# Patient Record
Sex: Male | Born: 1945 | Race: Black or African American | Hispanic: No | Marital: Married | Smoking: Never smoker
Health system: Southern US, Community
[De-identification: ages and names within clinical notes are randomized; demographics above are authoritative.]

## PROBLEM LIST (undated history)

## (undated) DIAGNOSIS — K219 Gastro-esophageal reflux disease without esophagitis: Secondary | ICD-10-CM

## (undated) DIAGNOSIS — E7800 Pure hypercholesterolemia, unspecified: Secondary | ICD-10-CM

## (undated) DIAGNOSIS — R338 Other retention of urine: Secondary | ICD-10-CM

## (undated) DIAGNOSIS — I10 Essential (primary) hypertension: Secondary | ICD-10-CM

## (undated) DIAGNOSIS — N401 Enlarged prostate with lower urinary tract symptoms: Secondary | ICD-10-CM

## (undated) DIAGNOSIS — E119 Type 2 diabetes mellitus without complications: Secondary | ICD-10-CM

## (undated) DIAGNOSIS — M549 Dorsalgia, unspecified: Secondary | ICD-10-CM

## (undated) HISTORY — PX: COLONOSCOPY: SHX174

## (undated) HISTORY — PX: APPENDECTOMY: SHX54

---

## 1997-06-09 ENCOUNTER — Other Ambulatory Visit: Admission: RE | Admit: 1997-06-09 | Discharge: 1997-06-09 | Payer: Self-pay | Admitting: Internal Medicine

## 2000-04-14 ENCOUNTER — Encounter: Admission: RE | Admit: 2000-04-14 | Discharge: 2000-07-13 | Payer: Self-pay | Admitting: Internal Medicine

## 2004-03-08 ENCOUNTER — Ambulatory Visit (HOSPITAL_COMMUNITY): Admission: RE | Admit: 2004-03-08 | Discharge: 2004-03-08 | Payer: Self-pay | Admitting: Gastroenterology

## 2012-11-23 ENCOUNTER — Emergency Department (HOSPITAL_COMMUNITY): Payer: No Typology Code available for payment source

## 2012-11-23 ENCOUNTER — Encounter (HOSPITAL_COMMUNITY): Payer: Self-pay | Admitting: Emergency Medicine

## 2012-11-23 ENCOUNTER — Emergency Department (HOSPITAL_COMMUNITY)
Admission: EM | Admit: 2012-11-23 | Discharge: 2012-11-23 | Disposition: A | Discharge status: Home / Self Care | Payer: No Typology Code available for payment source | Attending: Emergency Medicine | Admitting: Emergency Medicine

## 2012-11-23 DIAGNOSIS — E119 Type 2 diabetes mellitus without complications: Secondary | ICD-10-CM | POA: Insufficient documentation

## 2012-11-23 DIAGNOSIS — Z79899 Other long term (current) drug therapy: Secondary | ICD-10-CM | POA: Insufficient documentation

## 2012-11-23 DIAGNOSIS — I10 Essential (primary) hypertension: Secondary | ICD-10-CM | POA: Insufficient documentation

## 2012-11-23 DIAGNOSIS — IMO0002 Reserved for concepts with insufficient information to code with codable children: Secondary | ICD-10-CM | POA: Insufficient documentation

## 2012-11-23 DIAGNOSIS — Z7982 Long term (current) use of aspirin: Secondary | ICD-10-CM | POA: Insufficient documentation

## 2012-11-23 DIAGNOSIS — M25519 Pain in unspecified shoulder: Secondary | ICD-10-CM | POA: Insufficient documentation

## 2012-11-23 DIAGNOSIS — M25522 Pain in left elbow: Secondary | ICD-10-CM

## 2012-11-23 DIAGNOSIS — M25529 Pain in unspecified elbow: Secondary | ICD-10-CM | POA: Insufficient documentation

## 2012-11-23 DIAGNOSIS — Z880 Allergy status to penicillin: Secondary | ICD-10-CM | POA: Insufficient documentation

## 2012-11-23 DIAGNOSIS — M542 Cervicalgia: Secondary | ICD-10-CM | POA: Insufficient documentation

## 2012-11-23 DIAGNOSIS — R079 Chest pain, unspecified: Secondary | ICD-10-CM | POA: Insufficient documentation

## 2012-11-23 DIAGNOSIS — R51 Headache: Secondary | ICD-10-CM | POA: Insufficient documentation

## 2012-11-23 DIAGNOSIS — M25512 Pain in left shoulder: Secondary | ICD-10-CM

## 2012-11-23 DIAGNOSIS — Y939 Activity, unspecified: Secondary | ICD-10-CM | POA: Insufficient documentation

## 2012-11-23 DIAGNOSIS — E7800 Pure hypercholesterolemia, unspecified: Secondary | ICD-10-CM | POA: Insufficient documentation

## 2012-11-23 DIAGNOSIS — Y92410 Unspecified street and highway as the place of occurrence of the external cause: Secondary | ICD-10-CM | POA: Insufficient documentation

## 2012-11-23 HISTORY — DX: Essential (primary) hypertension: I10

## 2012-11-23 HISTORY — DX: Type 2 diabetes mellitus without complications: E11.9

## 2012-11-23 HISTORY — DX: Pure hypercholesterolemia, unspecified: E78.00

## 2012-11-23 MED ORDER — METHOCARBAMOL 500 MG PO TABS: 500.0000 mg | ORAL_TABLET | Freq: Two times a day (BID) | ORAL | Status: DC
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
## 2012-11-23 NOTE — ED Notes (Signed)
Patient transported back from X-ray 

## 2012-11-26 NOTE — ED Provider Notes (Signed)
Medical screening examination/treatment/procedure(s) were performed by non-physician practitioner and as supervising physician I was immediately available for consultation/collaboration.  EKG Interpretation   None        Juliet Rude. Rubin Payor, MD 11/26/12 1610

## 2014-07-01 ENCOUNTER — Other Ambulatory Visit (HOSPITAL_COMMUNITY): Payer: Self-pay | Admitting: Urology

## 2014-07-01 DIAGNOSIS — R972 Elevated prostate specific antigen [PSA]: Secondary | ICD-10-CM

## 2014-07-13 ENCOUNTER — Ambulatory Visit (INDEPENDENT_AMBULATORY_CARE_PROVIDER_SITE_OTHER): Payer: PPO | Admitting: Psychology

## 2014-07-13 DIAGNOSIS — F332 Major depressive disorder, recurrent severe without psychotic features: Secondary | ICD-10-CM

## 2014-07-21 ENCOUNTER — Ambulatory Visit (HOSPITAL_COMMUNITY)
Admission: RE | Admit: 2014-07-21 | Discharge: 2014-07-21 | Disposition: A | Discharge status: Home / Self Care | Payer: PPO | Source: Ambulatory Visit | Attending: Urology | Admitting: Urology

## 2014-07-21 DIAGNOSIS — N433 Hydrocele, unspecified: Secondary | ICD-10-CM | POA: Diagnosis not present

## 2014-07-21 DIAGNOSIS — N400 Enlarged prostate without lower urinary tract symptoms: Secondary | ICD-10-CM | POA: Insufficient documentation

## 2014-07-21 DIAGNOSIS — R972 Elevated prostate specific antigen [PSA]: Secondary | ICD-10-CM | POA: Diagnosis present

## 2014-07-21 DIAGNOSIS — N320 Bladder-neck obstruction: Secondary | ICD-10-CM | POA: Insufficient documentation

## 2014-07-21 LAB — POCT I-STAT CREATININE: CREATININE: 1.3 mg/dL — AB (ref 0.61–1.24)

## 2014-07-21 MED ORDER — GADOBENATE DIMEGLUMINE 529 MG/ML IV SOLN
20.0000 mL | Freq: Once | INTRAVENOUS | Status: AC | PRN
  Administered 2014-07-21: 16 mL via INTRAVENOUS

## 2014-07-26 DIAGNOSIS — M545 Low back pain, unspecified: Secondary | ICD-10-CM | POA: Insufficient documentation

## 2014-08-10 ENCOUNTER — Ambulatory Visit (INDEPENDENT_AMBULATORY_CARE_PROVIDER_SITE_OTHER): Payer: PPO | Admitting: Psychology

## 2014-08-10 DIAGNOSIS — F332 Major depressive disorder, recurrent severe without psychotic features: Secondary | ICD-10-CM

## 2014-09-03 ENCOUNTER — Ambulatory Visit (INDEPENDENT_AMBULATORY_CARE_PROVIDER_SITE_OTHER): Payer: PPO | Admitting: Physician Assistant

## 2014-09-03 VITALS — BP 132/80 | HR 92 | Temp 98.2°F | Resp 16 | Ht 69.0 in | Wt 172.0 lb

## 2014-09-03 DIAGNOSIS — T839XXA Unspecified complication of genitourinary prosthetic device, implant and graft, initial encounter: Secondary | ICD-10-CM | POA: Diagnosis not present

## 2014-09-03 NOTE — Progress Notes (Signed)
   09/03/2014 at 2:36 PM  Ricky Shields / DOB: 27-Jan-1945 / MRN: [id number]  The patient  does not have a problem list on file.  SUBJECTIVE  Ricky Shields is a 69 y.o. well appearing male presenting for the chief complaint of leaky urine bag that started this morning.  He reports the bag is leaking urine at the bottom of the bag, and reports that his urethral catheter is not problematic at this time and he feels well overall.       He  has a past medical history of Hypertension; Diabetes mellitus without complication; and High cholesterol.    Medications reviewed and updated by myself where necessary, and exist elsewhere in the encounter.   Mr. Rueda is allergic to penicillins. He  reports that he has never smoked. He does not have any smokeless tobacco history on file. He reports that he does not drink alcohol or use illicit drugs. He  has no sexual activity history on file. The patient  has no past surgical history on file.  His family history includes Stroke in his father and mother.  Review of Systems  Constitutional: Negative.   Genitourinary: Negative.     OBJECTIVE  His  height is 5\' 9"  (1.753 m) and weight is 172 lb (78.019 kg). His oral temperature is 98.2 F (36.8 C). His blood pressure is 132/80 and his pulse is 92. His respiration is 16 and oxygen saturation is 99%.  The patient's body mass index is 25.39 kg/(m^2).  Physical Exam  Constitutional: He is oriented to person, place, and time. He appears well-developed and well-nourished. No distress.  Cardiovascular: Normal rate.   Respiratory: Effort normal.  Neurological: He is alert and oriented to person, place, and time.  Skin: Skin is warm and dry. He is not diaphoretic.  Psychiatric: He has a normal mood and affect.    No results found for this or any previous visit (from the past 24 hour(s)).  ASSESSMENT & PLAN  [name] was seen today for catheter bag leak.  Diagnoses and all orders for this visit:  Foley  catheter problem, initial encounter: Patient charged only for the bag.     The patient was advised to call or come back to clinic if he does not see an improvement in symptoms, or worsens with the above plan.   [name], MHS, PA-C [facility] 09/03/2014 2:36 PM

## 2014-09-14 ENCOUNTER — Ambulatory Visit (INDEPENDENT_AMBULATORY_CARE_PROVIDER_SITE_OTHER): Payer: PPO | Admitting: Psychology

## 2014-09-14 DIAGNOSIS — F332 Major depressive disorder, recurrent severe without psychotic features: Secondary | ICD-10-CM

## 2014-09-23 ENCOUNTER — Other Ambulatory Visit: Payer: Self-pay | Admitting: Urology

## 2014-10-14 ENCOUNTER — Encounter (HOSPITAL_BASED_OUTPATIENT_CLINIC_OR_DEPARTMENT_OTHER): Payer: Self-pay | Admitting: *Deleted

## 2014-10-14 NOTE — Progress Notes (Signed)
To [facility] at 0645  Istat,Ekg on arrival-Instructed Npo after Mn-verbalized understands.

## 2014-10-20 NOTE — H&P (Signed)
History of Present Illness Consult for BPH and urinary retention referred by Dr. Janice Norrie. PCP Dr. Lavina Hamman.     1 - urinary retention - pt developed urinary retention. PVR > 1000 ml. He voided a small amount of urine and repeat PVR is 979 ml. His serum creatinine was 1.3. He was catheterized for 990 ml. He failed a voiding trial. UDS revealed good bladder contraction with no flow. Bladder somewhat unstable.     2 - BPH - pt is on alfuzosin, Renal u/s shows no hydronephrosis. Prostate volume is 86.74 ml. I reviewed the images.     3 - history of elevated PSA with a negative prostate biopsy in September 2009. PSA 5.32. PSA has been fluctuating between 5.1 and 6.9. PHI in December 2015 shows 83% probability of being cancer free. PHI score is 28.67. His June 2016 PSA was 8.07 with a 26% free. F/u Jul 2016 MRI showed no signs of macroscopic prostate cancer, but a distended bladder.      Today, patient is seen for the above. Foley in place, draining well. Urine clear. No fever     Past Medical History Problems  1. History of hypercholesterolemia (Z86.39) 2. History of hypertension (Z86.79)  Surgical History Problems  1. History of Colostomy 2. History of Complete Colonoscopy  Current Meds 1. Alfuzosin HCl ER 10 MG Oral Tablet Extended Release 24 Hour; take 1 tablet by mouth at  bedtime;  Therapy: 10Sep2015 to (Evaluate:21Feb2017)  Requested for: 25Aug2016; Last  Rx:25Aug2016 Ordered 2. Amlodipine-Valsartan-HCTZ 5-160-12.5 MG Oral Tablet;  Therapy: [id number] to Recorded 3. Aspirin 81 MG TABS;  Therapy: (Recorded:29Feb2012) to Recorded 4. Ciprofloxacin HCl - 500 MG Oral Tablet; TAKE 1 TABLET BID STARTING THE DAY  BEFORE PROCEDURE;  Therapy: [id number] to (Last Rx:11Jul2016)  Requested for: [id number] Ordered 5. Diazepam 10 MG Oral Tablet; Take tablet 1 hour prior to procedure;  Therapy: 21Jun2016 to (Last Rx:21Jun2016) Ordered 6. MetFORMIN HCl TABS;  Therapy: (Recorded:12Feb2009) to  Recorded 7. Multi Vitamin/Minerals TABS;  Therapy: (Recorded:29Feb2012) to Recorded 8. Simvastatin 40 MG Oral Tablet;  Therapy: 05Sep2007 to Recorded 9. ValACYclovir HCl - 500 MG Oral Tablet; take 1 tablet by mouth once daily;  Therapy: 20Sep2012 to (Evaluate:21Feb2017)  Requested for: 25Aug2016; Last  Rx:25Aug2016 Ordered 10. Valsartan-Hydrochlorothiazide 160-12.5 MG Oral Tablet;   Therapy: [id number] to Recorded 11. Viagra 100 MG Oral Tablet; UAD - USE AS DIRECTED;   Therapy: 10Aug2009 to (Evaluate:09May2016)  Requested for: [id number]; Last   Rx:17Dec2015 Ordered  Allergies Medication  1. Penicillins  Family History Problems  1. Family history of Family Health Status Number Of Children   2 sons 1 daughter 2. Family history of hypertension (Z82.49) : Sister  Social History Problems  1. Alcohol Use 2. Former smoker [phone]) 3. Marital History - Single 4. History of Tobacco Use   smoked for 5 years, quit 40 years ago  Vitals Vital Signs [Data Includes: Last 1 Day]  Recorded: 09Sep2016 08:12AM  Weight: 172 lb  BMI Calculated: 25.4 BSA Calculated: 1.94 Blood Pressure: 124 / 74 Temperature: 97.5 F Heart Rate: 83  Results/Data Urine [Data Includes: Last 1 Day]   [id number]  COLOR YELLOW   APPEARANCE CLOUDY   SPECIFIC GRAVITY 1.025   pH 6.0   GLUCOSE NEGATIVE   BILIRUBIN NEGATIVE   KETONE NEGATIVE   BLOOD 3+   PROTEIN 3+   NITRITE POSITIVE   LEUKOCYTE ESTERASE 2+   SQUAMOUS EPITHELIAL/HPF 0-5 HPF  WBC >60 WBC/HPF  RBC 3-10 RBC/HPF  BACTERIA MANY HPF  CRYSTALS NONE SEEN HPF  CASTS NONE SEEN LPF  Yeast NONE SEEN HPF   The following images/tracing/specimen were independently visualized:  UDS, MRI, U/S.    Procedure  Procedure: Cystoscopy   Informed Consent: Risks, benefits, and potential adverse events were discussed and informed consent was obtained from the patient.  Prep: The patient was prepped with betadine.  Antibiotic prophylaxis: Ciprofloxacin.   Procedure Note:  Urethral meatus:. No abnormalities.  Anterior urethra: No abnormalities.  Prostatic urethra: No abnormalities . The lateral and median prostatic lobes were enlarged. An enlarged intravesical median lobe was visualized.  Bladder: Visulization was clear. The ureteral orifices were in the normal anatomic position bilaterally and had clear efflux of urine. A systematic survey of the bladder demonstrated no bladder tumors or stones. The mucosa was smooth without abnormalities. Examination of the bladder demonstrated trabeculation. The patient tolerated the procedure well.  Complications: None. He was filled to 250 cc. He had the urge to void but could not. He was prepped and a 16 [place] Foley catheter was placed without difficulty. It was left to gravity drainage.    Assessment Assessed  1. Benign prostatic hyperplasia with urinary obstruction (N40.1,N13.8) 2. Urinary retention (R33.9) 3. Elevated prostate specific antigen (PSA) (R97.2)  Plan Benign prostatic hyperplasia with urinary obstruction  1. Cysto; Status:Complete;   Done: [id number] Health Maintenance  2. UA With REFLEX; [Do Not Release]; Status:Complete;   Done: [id number] 08:01AM Urinary retention  3. Follow-up [place] Procedure Office  Follow-up  Status: Canceled - Appointment,Date of  Service 4. Follow-up Schedule Surgery Office  Follow-up  Status: Hold For - Appointment   Requested for: 09Sep2016 5. Cath, simple, wIinsert Temp Cath; Status:Hold For - Appointment,Date of Service;  Requested GNO:[id number];  6. URINE CULTURE; Status:Hold For - Specimen/Data Collection,Appointment; Requested  VQX:[id number];   Discussion/Summary BPH, urinary retention - we discussed the nature risk and benefits of adding a 5 alpha reductase inhibitor. We discussed the nature risk and benefits of procedures such as ureteral left, greenlight photo vaporization or TURP. All questions answered. Patient would like to proceed with greenlight  photo vaporization of the prostate. He has a prominent median lobe. Discussed with procedures in most cases retention and flow improves, frequency and urgency can improvement in some cases developer worsen. We also discussed risk of incontinence, stricture, bleeding, erectile dysfunction among others. We discussed with the larger prostate he may need a staged procedure.        Elevated PSA - We discussed the nature risks and benefits of PSA screening as well as the nature of elevated PSA (benign versus malignant). We discussed the possibility of prostate cancer exists as the PSA rises above 2.5 and even as it rises over 1. We discussed the nature risks and benefits of continued surveillance, other lab tests, transrectal ultrasound/prostate biopsy, or prostate MRI. We discussed the management of prostate cancer might include active surveillance versus treatment depending on patient and cancer characteristics. All questions answered. Given his prior negative biopsy, normal MRI, normal PSA density, high percent free his chance of having a high-grade prostate cancer is relatively low. We discussed the possibility of prostate cancer. We've discussed we will continue to monitor PSA and digital rectal exams     Signatures Electronically signed by : Festus Aloe, M.D.; Sep 23 2014  1:25PM EST  Add; Cx citrobacter - sent abx.

## 2014-10-21 ENCOUNTER — Ambulatory Visit (HOSPITAL_BASED_OUTPATIENT_CLINIC_OR_DEPARTMENT_OTHER)
Admission: RE | Admit: 2014-10-21 | Discharge: 2014-10-21 | Disposition: A | Discharge status: Home / Self Care | Payer: PPO | Source: Ambulatory Visit | Attending: Urology | Admitting: Urology

## 2014-10-21 ENCOUNTER — Ambulatory Visit (HOSPITAL_BASED_OUTPATIENT_CLINIC_OR_DEPARTMENT_OTHER): Payer: PPO | Admitting: Anesthesiology

## 2014-10-21 ENCOUNTER — Encounter (HOSPITAL_BASED_OUTPATIENT_CLINIC_OR_DEPARTMENT_OTHER)
Admission: RE | Disposition: A | Discharge status: Home / Self Care | Payer: Self-pay | Source: Ambulatory Visit | Attending: Urology

## 2014-10-21 ENCOUNTER — Encounter (HOSPITAL_BASED_OUTPATIENT_CLINIC_OR_DEPARTMENT_OTHER): Payer: Self-pay | Admitting: Anesthesiology

## 2014-10-21 ENCOUNTER — Other Ambulatory Visit: Payer: Self-pay

## 2014-10-21 DIAGNOSIS — Z87891 Personal history of nicotine dependence: Secondary | ICD-10-CM | POA: Diagnosis not present

## 2014-10-21 DIAGNOSIS — I10 Essential (primary) hypertension: Secondary | ICD-10-CM | POA: Diagnosis not present

## 2014-10-21 DIAGNOSIS — Z7984 Long term (current) use of oral hypoglycemic drugs: Secondary | ICD-10-CM | POA: Diagnosis not present

## 2014-10-21 DIAGNOSIS — E119 Type 2 diabetes mellitus without complications: Secondary | ICD-10-CM | POA: Insufficient documentation

## 2014-10-21 DIAGNOSIS — N401 Enlarged prostate with lower urinary tract symptoms: Secondary | ICD-10-CM | POA: Insufficient documentation

## 2014-10-21 DIAGNOSIS — R338 Other retention of urine: Secondary | ICD-10-CM | POA: Diagnosis not present

## 2014-10-21 HISTORY — DX: Benign prostatic hyperplasia with lower urinary tract symptoms: R33.8

## 2014-10-21 HISTORY — DX: Gastro-esophageal reflux disease without esophagitis: K21.9

## 2014-10-21 HISTORY — PX: GREEN LIGHT LASER TURP (TRANSURETHRAL RESECTION OF PROSTATE: SHX6260

## 2014-10-21 HISTORY — DX: Dorsalgia, unspecified: M54.9

## 2014-10-21 HISTORY — DX: Benign prostatic hyperplasia with lower urinary tract symptoms: N40.1

## 2014-10-21 LAB — POCT I-STAT 4, (NA,K, GLUC, HGB,HCT)
Glucose, Bld: 177 mg/dL — ABNORMAL HIGH (ref 65–99)
HCT: 41 % (ref 39.0–52.0)
HEMOGLOBIN: 13.9 g/dL (ref 13.0–17.0)
POTASSIUM: 4.1 mmol/L (ref 3.5–5.1)
SODIUM: 140 mmol/L (ref 135–145)

## 2014-10-21 LAB — GLUCOSE, CAPILLARY: Glucose-Capillary: 145 mg/dL — ABNORMAL HIGH (ref 65–99)

## 2014-10-21 SURGERY — GREEN LIGHT LASER TURP (TRANSURETHRAL RESECTION OF PROSTATE
Anesthesia: General | Site: Prostate

## 2014-10-21 MED ORDER — LACTATED RINGERS IV SOLN
INTRAVENOUS | Status: DC
Start: 2014-10-21 — End: 2014-10-21
  Filled 2014-10-21: qty 1000

## 2014-10-21 MED ORDER — DEXAMETHASONE SODIUM PHOSPHATE 4 MG/ML IJ SOLN
INTRAMUSCULAR | Status: DC | PRN
  Administered 2014-10-21: 10 mg via INTRAVENOUS

## 2014-10-21 MED ORDER — PROPOFOL 10 MG/ML IV BOLUS
INTRAVENOUS | Status: DC | PRN
  Administered 2014-10-21: 20 mg via INTRAVENOUS
  Administered 2014-10-21: 180 mg via INTRAVENOUS

## 2014-10-21 MED ORDER — LACTATED RINGERS IV SOLN
INTRAVENOUS | Status: DC
  Administered 2014-10-21 (×2): via INTRAVENOUS
  Filled 2014-10-21: qty 1000

## 2014-10-21 MED ORDER — SODIUM CHLORIDE 0.9 % IR SOLN
Status: DC | PRN
  Administered 2014-10-21: 19000 mL

## 2014-10-21 MED ORDER — MIDAZOLAM HCL 2 MG/2ML IJ SOLN
INTRAMUSCULAR | Status: AC
  Filled 2014-10-21: qty 2

## 2014-10-21 MED ORDER — CEFAZOLIN SODIUM-DEXTROSE 2-3 GM-% IV SOLR
INTRAVENOUS | Status: AC
  Filled 2014-10-21: qty 50

## 2014-10-21 MED ORDER — HYDROMORPHONE HCL 1 MG/ML IJ SOLN
0.2500 mg | INTRAMUSCULAR | Status: DC | PRN
  Filled 2014-10-21: qty 1

## 2014-10-21 MED ORDER — LEVOFLOXACIN IN D5W 500 MG/100ML IV SOLN
INTRAVENOUS | Status: AC
Start: 2014-10-21 — End: 2014-10-21
  Filled 2014-10-21: qty 100

## 2014-10-21 MED ORDER — LIDOCAINE HCL (CARDIAC) 20 MG/ML IV SOLN
INTRAVENOUS | Status: DC | PRN
  Administered 2014-10-21 (×2): 50 mg via INTRAVENOUS

## 2014-10-21 MED ORDER — PROMETHAZINE HCL 25 MG/ML IJ SOLN
6.2500 mg | INTRAMUSCULAR | Status: DC | PRN
  Filled 2014-10-21: qty 1

## 2014-10-21 MED ORDER — ASPIRIN EC 81 MG PO TBEC: 81.0000 mg | DELAYED_RELEASE_TABLET | Freq: Every day | ORAL | Status: DC

## 2014-10-21 MED ORDER — ACETAMINOPHEN 10 MG/ML IV SOLN
INTRAVENOUS | Status: DC | PRN
  Administered 2014-10-21: 1000 mg via INTRAVENOUS

## 2014-10-21 MED ORDER — FENTANYL CITRATE (PF) 100 MCG/2ML IJ SOLN
INTRAMUSCULAR | Status: AC
  Filled 2014-10-21: qty 4

## 2014-10-21 MED ORDER — EPHEDRINE SULFATE 50 MG/ML IJ SOLN
INTRAMUSCULAR | Status: DC | PRN
  Administered 2014-10-21 (×2): 10 mg via INTRAVENOUS

## 2014-10-21 MED ORDER — ONDANSETRON HCL 4 MG/2ML IJ SOLN
INTRAMUSCULAR | Status: DC | PRN
  Administered 2014-10-21: 4 mg via INTRAVENOUS

## 2014-10-21 MED ORDER — LEVOFLOXACIN IN D5W 500 MG/100ML IV SOLN
INTRAVENOUS | Status: DC | PRN
  Administered 2014-10-21: 500 mg via INTRAVENOUS

## 2014-10-21 MED ORDER — MIDAZOLAM HCL 5 MG/5ML IJ SOLN
INTRAMUSCULAR | Status: DC | PRN
  Administered 2014-10-21: 2 mg via INTRAVENOUS

## 2014-10-21 MED ORDER — FENTANYL CITRATE (PF) 100 MCG/2ML IJ SOLN
INTRAMUSCULAR | Status: DC | PRN
  Administered 2014-10-21 (×3): 25 ug via INTRAVENOUS
  Administered 2014-10-21: 50 ug via INTRAVENOUS

## 2014-10-21 MED ORDER — CEFAZOLIN SODIUM 1-5 GM-% IV SOLN
1.0000 g | INTRAVENOUS | Status: DC
  Filled 2014-10-21: qty 50

## 2014-10-21 SURGICAL SUPPLY — 26 items
BAG URINE DRAINAGE (UROLOGICAL SUPPLIES) ×2 IMPLANT
BAG URO CATCHER STRL LF (DRAPE) ×2 IMPLANT
CATH COUDE FOLEY 2W 5CC 18FR (CATHETERS) ×1 IMPLANT
CATH FOLEY 2WAY SLVR  5CC 18FR (CATHETERS)
CATH FOLEY 2WAY SLVR 5CC 18FR (CATHETERS) IMPLANT
CLOTH BEACON ORANGE TIMEOUT ST (SAFETY) ×2 IMPLANT
ELECT BIVAP BIPO 22/24 DONUT (ELECTROSURGICAL)
ELECT LOOP MED HF 24F 12D (CUTTING LOOP) IMPLANT
ELECTRD BIVAP BIPO 22/24 DONUT (ELECTROSURGICAL) IMPLANT
FEE RENTAL LASER GREENLIGHT (Laser) ×1 IMPLANT
GLOVE BIO SURGEON STRL SZ7.5 (GLOVE) ×2 IMPLANT
GOWN STRL REUS W/ TWL XL LVL3 (GOWN DISPOSABLE) ×1 IMPLANT
GOWN STRL REUS W/TWL LRG LVL3 (GOWN DISPOSABLE) ×1 IMPLANT
GOWN STRL REUS W/TWL XL LVL3 (GOWN DISPOSABLE) ×2
HOLDER FOLEY CATH W/STRAP (MISCELLANEOUS) ×1 IMPLANT
IV NS 1000ML (IV SOLUTION) ×2
IV NS 1000ML BAXH (IV SOLUTION) ×1 IMPLANT
IV NS IRRIG 3000ML ARTHROMATIC (IV SOLUTION) ×7 IMPLANT
IV SET EXTENSION GRAVITY 40 LF (IV SETS) ×2 IMPLANT
LASER FIBER /GREENLIGHT LASER (Laser) ×3 IMPLANT
LASER GREENLIGHT RENTAL P/PROC (Laser) ×2 IMPLANT
LOOP CUT BIPOLAR 24F LRG (ELECTROSURGICAL) IMPLANT
MANIFOLD NEPTUNE II (INSTRUMENTS) ×1 IMPLANT
PACK CYSTO (CUSTOM PROCEDURE TRAY) ×2 IMPLANT
SYR 30ML LL (SYRINGE) IMPLANT
SYRINGE IRR TOOMEY STRL 70CC (SYRINGE) IMPLANT

## 2014-10-21 NOTE — Op Note (Signed)
Preoperative diagnosis: BPH, urinary retention  Postoperative diagnosis: Same  Procedure: Greenlight photo vaporization of the prostate, staged  Surgeon: [name]  Anesthesia: Gen.  Indication for procedure: 69 year old with urinary retention. He felt several voiding trials and had bladder outlet obstruction on urodynamics. I discussed with him the nature risks benefits and alternatives to greenlight photo vaporization of the prostate and elected to proceed.  I discussed with the patient given his large prostate he may need a staged procedure.  Findings: On exam under anesthesia the penis was circumcised without mass or lesion, he developed a glanular hypospadias. The testicles were descended bilaterally and palpably normal. On digital rectal exam the prostate was enlarged to about 50 g but smooth without hard area or nodules.  On cystoscopy the urethra appeared normal, there was a large median lobe obstructing and short lateral lobes. The bladder was trabeculated with cellules but without obvious tumor. There was no stone. The trigone and ureteral orifices were in their normal orthotopic position.  Description of procedure: After consent was obtained patient brought to the operating room. After adequate anesthesia he was placed in lithotomy position and prepped and draped in the usual sterile fashion. A timeout was performed to confirm the patient and procedure. An exam under anesthesia was performed. The laser bridge and cystoscope was passed per urethra and the bladder carefully inspected. Rinse the bladder several times. The laser fiber was deployed after identifying the ureteral orifices and I made incisions at 5:00 and 7:00 between the median and lateral lobes brought these down to the apex. The apex incisions were made in the typical hockey-stick fashion and connected with the other incisions. Power was turned to 120 then 160 and the median lobe was vaporized. I then went to the apex and  brought the apical tissue back toward the bladder neck. And then from the bladder neck down to the apex on the right and in the left. This created an excellent channel.  The prostate was quite vascular and although the bleeding wasn't intense it was enough to limit visualization for much of the procedure. Despite that the patient had a good channel the end of the case. The trigone and ureteral orifices were normal on final inspection without injury. I did leave some apical tissue. He may need a staged procedure.  There was good hemostasis at low-pressure. The bladder was refilled and the scope removed. A an 31 [place] coud catheter was placed which was draining light pink urine. He was awakened and taken to recovery room in stable condition.  Consultations: None  Blood loss: Minimal  Specimens: None  Drains: 18 French Foley

## 2014-10-21 NOTE — Transfer of Care (Signed)
Last Vitals:  Filed Vitals:   10/21/14 0701  BP: 159/77  Pulse: 86  Temp: 36.9 C  Resp: 16    Immediate Anesthesia Transfer of Care Note  Patient: Ricky Shields  Procedure(s) Performed: Procedure(s) (LRB): GREEN LIGHT LASER TURP (TRANSURETHRAL RESECTION OF PROSTATE (N/A)  Patient Location: PACU  Anesthesia Type: General  Level of Consciousness: awake, alert  and oriented  Airway & Oxygen Therapy: Patient Spontanous Breathing and Patient connected to face mask oxygen  Post-op Assessment: Report given to PACU RN and Post -op Vital signs reviewed and stable  Post vital signs: Reviewed and stable  Complications: No apparent anesthesia complications

## 2014-10-21 NOTE — Brief Op Note (Signed)
10/21/2014  10:30 AM  PATIENT:  Scot Jun Thorns  69 y.o. male  PRE-OPERATIVE DIAGNOSIS:  BENIGN PROSTATIC HYPERPLASIA WITH URINARY RETENTION  POST-OPERATIVE DIAGNOSIS:  BENIGN PROSTATIC HYPERPLASIA WITH URINARY RETENTION  PROCEDURE:  Procedure(s): GREEN LIGHT LASER TURP (TRANSURETHRAL RESECTION OF PROSTATE (N/A)  SURGEON:  Surgeon(s) and Role:    * Festus Aloe, MD - Primary  PHYSICIAN ASSISTANT:   ASSISTANTS: none   ANESTHESIA:   general  EBL:  Total I/O In: 1200 [I.V.:1200] Out: -   BLOOD ADMINISTERED:none  DRAINS: Urinary Catheter (Foley)   LOCAL MEDICATIONS USED:  NONE  SPECIMEN:  No Specimen  DISPOSITION OF SPECIMEN:  N/A  COUNTS:  YES  TOURNIQUET:  * No tourniquets in log *  DICTATION: .Dragon Dictation  PLAN OF CARE: Discharge to home after PACU  PATIENT DISPOSITION:  PACU - hemodynamically stable.   Delay start of Pharmacological VTE agent (>24hrs) due to surgical blood loss or risk of bleeding: no

## 2014-10-21 NOTE — Anesthesia Postprocedure Evaluation (Signed)
  Anesthesia Post-op Note  Patient: Ricky Shields [facility]  Procedure(s) Performed: Procedure(s): GREEN LIGHT LASER TURP (TRANSURETHRAL RESECTION OF PROSTATE (N/A)  Patient Location: PACU  Anesthesia Type:General  Level of Consciousness: awake, alert  and oriented  Airway and Oxygen Therapy: Patient Spontanous Breathing  Post-op Pain: mild  Post-op Assessment: Post-op Vital signs reviewed and Patient's Cardiovascular Status Stable              Post-op Vital Signs: Reviewed and stable  Last Vitals:  Filed Vitals:   10/21/14 1233  BP: 132/77  Pulse: 71  Temp: 36 C  Resp: 16    Complications: No apparent anesthesia complications

## 2014-10-21 NOTE — OR Nursing (Signed)
PATIENT ARRIVED TO OR WITH FOLEY CATHETER IN PLACE / REMOVED DISCARDED 350 MLS OF URINE

## 2014-10-21 NOTE — Interval H&P Note (Signed)
History and Physical Interval Note:  10/21/2014 8:26 AM  We discussed risks of stricture, infection, incontinence, bleeding among other. Discussed likelihood of success (voiding without catheter) not 100% and he may need a staged procedure.    Ricky Shields

## 2014-10-21 NOTE — Anesthesia Preprocedure Evaluation (Signed)
Anesthesia Evaluation  Patient identified by MRN, date of birth, ID band Patient awake    Reviewed: Allergy & Precautions, NPO status , Patient's Chart, lab work & pertinent test results  Airway Mallampati: II  TM Distance: >3 FB Neck ROM: Full    Dental  (+) Teeth Intact   Pulmonary neg pulmonary ROS,    breath sounds clear to auscultation       Cardiovascular hypertension, Pt. on medications  Rhythm:Regular Rate:Normal     Neuro/Psych negative neurological ROS  negative psych ROS   GI/Hepatic Neg liver ROS, GERD  Medicated,  Endo/Other  diabetes, Type 2, Oral Hypoglycemic Agents  Renal/GU negative Renal ROS  negative genitourinary   Musculoskeletal negative musculoskeletal ROS (+)   Abdominal   Peds negative pediatric ROS (+)  Hematology negative hematology ROS (+)   Anesthesia Other Findings   Reproductive/Obstetrics negative OB ROS                             No results found for: WBC, HGB, HCT, MCV, PLT Lab Results  Component Value Date   CREATININE 1.30* 07/21/2014   EKG: normal sinus rhythm.   Anesthesia Physical Anesthesia Plan  ASA: II  Anesthesia Plan: General   Post-op Pain Management:    Induction: Intravenous  Airway Management Planned: Oral ETT  Additional Equipment:   Intra-op Plan:   Post-operative Plan: Extubation in OR  Informed Consent: I have reviewed the patients History and Physical, chart, labs and discussed the procedure including the risks, benefits and alternatives for the proposed anesthesia with the patient or authorized representative who has indicated his/her understanding and acceptance.   Dental advisory given  Plan Discussed with: CRNA  Anesthesia Plan Comments:         Anesthesia Quick Evaluation

## 2014-10-21 NOTE — Interval H&P Note (Signed)
History and Physical Interval Note:  10/21/2014 8:25 AM  Ricky Shields  has presented today for surgery, with the diagnosis of BENIGN PROSTATIC HYPERPLASIA WITH URINARY RETENTION  The various methods of treatment have been discussed with the patient and family. After consideration of risks, benefits and other options for treatment, the patient has consented to  Procedure(s): GREEN LIGHT LASER TURP (TRANSURETHRAL RESECTION OF PROSTATE (N/A) as a surgical intervention .  The patient's history has been reviewed, patient examined, no change in status, stable for surgery. His urine Cx was resistant to Cefazolin and I switched abx to Levaquin. Also, pt started NF 3 days ago and has noted urine clearing and less catheter discomfort. I have reviewed the patient's chart and labs.  Questions were answered to the patient's satisfaction.     Ramondo Dietze

## 2014-10-21 NOTE — Anesthesia Procedure Notes (Signed)
Procedure Name: LMA Insertion Date/Time: 10/21/2014 8:35 AM Performed by: Mechele Claude Pre-anesthesia Checklist: Patient identified, Emergency Drugs available, Suction available and Patient being monitored Patient Re-evaluated:Patient Re-evaluated prior to inductionOxygen Delivery Method: Circle System Utilized Preoxygenation: Pre-oxygenation with 100% oxygen Intubation Type: IV induction Ventilation: Mask ventilation without difficulty LMA: LMA inserted LMA Size: 4.0 Number of attempts: 1 Airway Equipment and Method: bite block Placement Confirmation: positive ETCO2 Tube secured with: Tape Dental Injury: Teeth and Oropharynx as per pre-operative assessment

## 2014-10-21 NOTE — Discharge Instructions (Signed)
Post Anesthesia Home Care Instructions  Activity: Get plenty of rest for the remainder of the day. A responsible adult should stay with you for 24 hours following the procedure.  For the next 24 hours, DO NOT: -Drive a car -Paediatric nurse -Drink alcoholic beverages -Take any medication unless instructed by your physician -Make any legal decisions or sign important papers.  Meals: Start with liquid foods such as gelatin or soup. Progress to regular foods as tolerated. Avoid greasy, spicy, heavy foods. If nausea and/or vomiting occur, drink only clear liquids until the nausea and/or vomiting subsides. Call your physician if vomiting continues.  Special Instructions/Symptoms: Your throat may feel dry or sore from the anesthesia or the breathing tube placed in your throat during surgery. If this causes discomfort, gargle with warm salt water. The discomfort should disappear within 24 hours.  If you had a scopolamine patch placed behind your ear for the management of post- operative nausea and/or vomiting:  1. The medication in the patch is effective for 72 hours, after which it should be removed.  Wrap patch in a tissue and discard in the trash. Wash hands thoroughly with soap and water. 2. You may remove the patch earlier than 72 hours if you experience unpleasant side effects which may include dry mouth, dizziness or visual disturbances. 3. Avoid touching the patch. Wash your hands with soap and water after contact with the patch.   Transurethral Resection of the Prostate, Care After Refer to this sheet in the next few weeks. These instructions provide you with information on caring for yourself after your procedure. Your caregiver also may give you specific instructions. Your treatment has been planned according to current medical practices, but complications sometimes occur. Call your caregiver if you have any problems or questions after your procedure. HOME CARE INSTRUCTIONS  Recovery  can take 4-6 weeks. Avoid alcohol, caffeinated drinks, and spicy foods for 2 weeks after your procedure. Drink enough fluids to keep your urine clear or pale yellow. Urinate as soon as you feel the urge to do so. Do not try to hold your urine for long periods of time. During recovery you may experience pain caused by bladder spasms, which result in a very intense urge to urinate. Take all medicines as directed by your caregiver, including medicines for pain. Try to limit the amount of pain medicines you take because it can cause constipation. If you do become constipated, do not strain to move your bowels. Straining can increase bleeding. Constipation can be minimized by increasing the amount fluids and fiber in your diet. Your caregiver also may prescribe a stool softener. Do not lift heavy objects (more than 5 lb [2.25 kg]) or perform exercises that cause you to strain for at least 1 month after your procedure. When sitting, you may want to sit in a soft chair or use a cushion. For the first 10 days after your procedure, avoid the following activities:  Running.  Strenuous work.  Long walks.  Riding in a car for extended periods.  Sex. SEEK MEDICAL CARE IF: 1. You have difficulty urinating. 2. You have blood in your urine that does not go away after you rest or increase your fluid intake. 3. You have swelling in your penis or scrotum. SEEK IMMEDIATE MEDICAL CARE IF:  1. You are suddenly unable to urinate. 2. You notice blood clots in your urine. 3. You have chills. 4. You have a fever. 5. You have pain in your back or lower abdomen. 6.  You have pain or swelling in your legs. MAKE SURE YOU:  1. Understand these instructions. 2. Will watch your condition. 3. Will get help right away if you are not doing well or get worse.   This information is not intended to replace advice given to you by your health care provider. Make sure you discuss any questions you have with your health care  provider.   Document Released: 12/31/2004 Document Revised: 01/21/2014 Document Reviewed: 02/08/2011 Elsevier Interactive Patient Education 2016 [place], Adult A Foley catheter is a soft, flexible tube that is placed into the bladder to drain urine. A Foley catheter may be inserted if:  You leak urine or are not able to control when you urinate (urinary incontinence).  You are not able to urinate when you need to (urinary retention).  You had prostate surgery or surgery on the genitals.  You have certain medical conditions, such as multiple sclerosis, dementia, or a spinal cord injury. If you are going home with a Foley catheter in place, follow the instructions below. TAKING CARE OF THE CATHETER 4. Wash your hands with soap and water. 5. Using mild soap and warm water on a clean washcloth:  Clean the area on your body closest to the catheter insertion site using a circular motion, moving away from the catheter. Never wipe toward the catheter because this could sweep bacteria up into the urethra and cause infection.  Remove all traces of soap. Pat the area dry with a clean towel. For males, reposition the foreskin. 6. Attach the catheter to your leg so there is no tension on the catheter. Use adhesive tape or a leg strap. If you are using adhesive tape, remove any sticky residue left behind by the previous tape you used. 7. Keep the drainage bag below the level of the bladder, but keep it off the floor. 8. Check throughout the day to be sure the catheter is working and urine is draining freely. Make sure the tubing does not become kinked. 9. Do not pull on the catheter or try to remove it. Pulling could damage internal tissues. TAKING CARE OF THE DRAINAGE BAGS You will be given two drainage bags to take home. One is a large overnight drainage bag, and the other is a smaller leg bag that fits underneath clothing. You may wear the overnight bag at any time, but you  should never wear the smaller leg bag at night. Follow the instructions below for how to empty, change, and clean your drainage bags. Emptying the Drainage Bag You must empty your drainage bag when it is  - full or at least 2-3 times a day. 7. Wash your hands with soap and water. 8. Keep the drainage bag below your hips, below the level of your bladder. This stops urine from going back into the tubing and into your bladder. 9. Hold the dirty bag over the toilet or a clean container. 10. Open the pour spout at the bottom of the bag and empty the urine into the toilet or container. Do not let the pour spout touch the toilet, container, or any other surface. Doing so can place bacteria on the bag, which can cause an infection. 11. Clean the pour spout with a gauze pad or cotton ball that has rubbing alcohol on it. 12. Close the pour spout. 13. Attach the bag to your leg with adhesive tape or a leg strap. 14. Wash your hands well. Changing the Drainage Bag Change your drainage  bag once a month or sooner if it starts to smell bad or look dirty. Below are steps to follow when changing the drainage bag. 4. Wash your hands with soap and water. 5. Pinch off the rubber catheter so that urine does not spill out. 6. Disconnect the catheter tube from the drainage tube at the connection valve. Do not let the tubes touch any surface. 7. Clean the end of the catheter tube with an alcohol wipe. Use a different alcohol wipe to clean the end of the drainage tube. 8. Connect the catheter tube to the drainage tube of the clean drainage bag. 9. Attach the new bag to the leg with adhesive tape or a leg strap. Avoid attaching the new bag too tightly. 10. Wash your hands well. Cleaning the Drainage Bag 1. Wash your hands with soap and water. 2. Wash the bag in warm, soapy water. 3. Rinse the bag thoroughly with warm water. 4. Fill the bag with a solution of white vinegar and water (1 cup vinegar to 1 qt warm water  [.2 L vinegar to 1 L warm water]). Close the bag and soak it for 30 minutes in the solution. 5. Rinse the bag with warm water. 6. Hang the bag to dry with the pour spout open and hanging downward. 7. Store the clean bag (once it is dry) in a clean plastic bag. 8. Wash your hands well. PREVENTING INFECTION  Wash your hands before and after handling your catheter.  Take showers daily and wash the area where the catheter enters your body. Do not take baths. Replace wet leg straps with dry ones, if this applies.  Do not use powders, sprays, or lotions on the genital area. Only use creams, lotions, or ointments as directed by your caregiver.  For females, wipe from front to back after each bowel movement.  Drink enough fluids to keep your urine clear or pale yellow unless you have a fluid restriction.  Do not let the drainage bag or tubing touch or lie on the floor.  Wear cotton underwear to absorb moisture and to keep your skin drier. SEEK MEDICAL CARE IF:   Your urine is cloudy or smells unusually bad.  Your catheter becomes clogged.  You are not draining urine into the bag or your bladder feels full.  Your catheter starts to leak. SEEK IMMEDIATE MEDICAL CARE IF:   You have pain, swelling, redness, or pus where the catheter enters the body.  You have pain in the abdomen, legs, lower back, or bladder.  You have a fever.  You see blood fill the catheter, or your urine is pink or red.  You have nausea, vomiting, or chills.  Your catheter gets pulled out. MAKE SURE YOU:   Understand these instructions.  Will watch your condition.  Will get help right away if you are not doing well or get worse.   This information is not intended to replace advice given to you by your health care provider. Make sure you discuss any questions you have with your health care provider.   Document Released: 12/31/2004 Document Revised: 05/17/2013 Document Reviewed: 12/23/2011 Elsevier  Interactive Patient Education [employer].

## 2014-10-24 ENCOUNTER — Encounter (HOSPITAL_BASED_OUTPATIENT_CLINIC_OR_DEPARTMENT_OTHER): Payer: Self-pay | Admitting: Urology

## 2014-12-14 ENCOUNTER — Ambulatory Visit (INDEPENDENT_AMBULATORY_CARE_PROVIDER_SITE_OTHER): Payer: PPO | Admitting: Psychiatry

## 2014-12-14 ENCOUNTER — Encounter (HOSPITAL_COMMUNITY): Payer: Self-pay | Admitting: Psychiatry

## 2014-12-14 VITALS — BP 140/70 | HR 76 | Resp 12 | Ht 69.0 in | Wt 172.0 lb

## 2014-12-14 DIAGNOSIS — F329 Major depressive disorder, single episode, unspecified: Secondary | ICD-10-CM | POA: Diagnosis not present

## 2014-12-14 DIAGNOSIS — F32A Depression, unspecified: Secondary | ICD-10-CM

## 2014-12-14 MED ORDER — DULOXETINE HCL 20 MG PO CPEP: 20.0000 mg | ORAL_CAPSULE | Freq: Two times a day (BID) | ORAL | Status: DC

## 2014-12-14 NOTE — Progress Notes (Signed)
[facility] Initial Assessment Note  Ricky Shields IN:[id number] 69 y.o.  12/14/2014 12:06 PM  Chief Complaint:  I have a lot of back pain.  It is making me sad depressed and have anxiety.  I cannot enjoy my life.  My therapist recommended to see psychiatrist so I can take medication  History of Present Illness:  Patient is 69 year old African-American married man who is referred from Dr. Cheryln Manly for medication evaluation.  Patient is started seeing Dr. Cheryln Manly since this summer as he was experiencing increased depression, sadness, discouragement and lack of interest in his life.  Patient has long-standing back pain which has been getting worst in recent months.  Patient quit his job 14 months ago from [employer] as he is unable to function.  His back pain was so severe that he has unable to do his routine things.  He admitted due to his back pain he's been feeling very sad depressed and decided to have feeling of hopelessness worthlessness having crying spells , anhedonia, irritability, decreased energy and mood swings.  He feels guilty because he cannot play with his 68-year-old grandchild .  He also feels guilty because he cannot please his wife .  He has difficulty doing routine things and he admitted having the arguments with his wife.  He admitted once having fleeting and passive suicidal thoughts 2 months ago however he denies any active suicidal thoughts or any plan.  He endorsed some time having auditory hallucination that people calling his name but denies any paranoia, aggressive behavior, delusion, mania or any impulsive behavior.  He feels sometimes anxious and nervous about the future but denies OCD symptoms.  He denies any nightmares, flashback, panic attacks.  He has noticed lack of energy and fatigue most of the time.  His primary care physician recommended to see therapist and he has seen a few times Dr. Cheryln Manly who felt that he should be on any antidepressant.  Currently  he is not taking any antidepressant.  He is taking ibuprofen for his pain and in the past he has taken OxyContin but he do not like the side effects.  Patient denies drinking or using any illegal substances.  He lives with his wife.  Patient scored 25 on .  Major Depressive inventory questionnaire.  Suicidal Ideation: No Plan Formed: No Patient has means to carry out plan: No  Homicidal Ideation: No Plan Formed: No Patient has means to carry out plan: No  Past Psychiatric History/Hospitalization(s): Patient denies any previous history of psychiatric inpatient treatment, mania, depression, hallucination, paranoia or any aggressive behavior.  He has never taken any psychiatric medications before.  He started seeing Dr. Cheryln Manly [place]. Anxiety: No Bipolar Disorder: No Depression: No Mania: No Psychosis: No Schizophrenia: No Personality Disorder: No Hospitalization for psychiatric illness: No History of Electroconvulsive Shock Therapy: No Prior Suicide Attempts: No  Medical History; Patient has chronic back pain, hypertension, BPH, diabetes, GERD.  His primary care physician is [facility].  His pain is managed by Dr. Herold Harms.  Traumatic brain injury: Patient denies any history of traumatic brain injury.  Family History; Patient denies any family history of psychiatric illness.  Education and Work History; Patient is high school graduate.  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  October that he work at [employer] for 14 years until he could not continue to work anymore due to chronic pain.  Psychosocial History; Patient born and raised in  [place].  He married twice.  His first wife died in 05/01/2000 due to breast cancer.  He's been married to his second wife for 6 years.  Patient has 3 outgrown children.  His daughter lives in [place], 1 son live in [place] and 1 son live in [place].  Patient has multiple grandkids.   Patient lives with his wife.  Legal History; Patient denies any legal issues.  History Of Abuse; Patient denies any history of abuse.  Substance Abuse History; Patient denies any history of drinking or using any illegal substance use.  Review of Systems: Psychiatric: Agitation: Irritability Hallucination: Sometime auditory hallucination, believed people calling his name Depressed Mood: Yes Insomnia: Yes Hypersomnia: No Altered Concentration: No Feels Worthless: Yes Grandiose Ideas: No Belief In Special Powers: No New/Increased Substance Abuse: No Compulsions: No  Neurologic: Headache: No Seizure: No Paresthesias: No   Outpatient Encounter Prescriptions as of 12/14/2014  Medication Sig  . Amlodipine-Valsartan-HCTZ 5-160-12.5 MG TABS tablet by mouth once daily  . aspirin EC 81 MG tablet Take 1 tablet (81 mg total) by mouth daily.  . cholecalciferol (VITAMIN D) 1000 UNITS tablet Take 1,000 Units by mouth daily.  . DULoxetine (CYMBALTA) 20 MG capsule Take 1 capsule (20 mg total) by mouth 2 (two) times daily.  Marland Kitchen FLUZONE HIGH-DOSE 0.5 ML SUSY inject 0.5 milliliter intramuscularly  . ibuprofen (ADVIL,MOTRIN) 600 MG tablet Take 600 mg by mouth every 6 (six) hours as needed.  . metFORMIN (GLUCOPHAGE) 500 MG tablet Take 500 mg by mouth 2 (two) times daily with a meal.   . methocarbamol (ROBAXIN) 500 MG tablet Take 1 tablet (500 mg total) by mouth 2 (two) times daily.  . Multiple Vitamin (MULTIVITAMIN WITH MINERALS) TABS tablet Take 1 tablet by mouth daily.  . ONE TOUCH ULTRA TEST test strip   . ranitidine (ZANTAC) 150 MG tablet Take 150 mg by mouth as needed for heartburn.  . simvastatin (ZOCOR) 40 MG tablet Take 40 mg by mouth at bedtime.  Marland Kitchen UROXATRAL 10 MG 24 hr tablet Take 10 mg by mouth daily.  . valACYclovir (VALTREX) 500 MG tablet   . [DISCONTINUED] valsartan-hydrochlorothiazide (DIOVAN-HCT) 160-12.5 MG per tablet Take 1 tablet by mouth daily.   No facility-administered  encounter medications on file as of 12/14/2014.    Recent Results (from the past 05-02-2158 hour(s))  I-STAT 4, (NA,K, GLUC, HGB,HCT)     Status: Abnormal   Collection Time: 10/21/14  7:32 AM  Result Value Ref Range   Sodium 140 135 - 145 mmol/L   Potassium 4.1 3.5 - 5.1 mmol/L   Glucose, Bld 177 (H) 65 - 99 mg/dL   HCT 41.0 39.0 - 52.0 %   Hemoglobin 13.9 13.0 - 17.0 g/dL  Glucose, capillary     Status: Abnormal   Collection Time: 10/21/14 10:46 AM  Result Value Ref Range   Glucose-Capillary 145 (H) 65 - 99 mg/dL      Constitutional:  BP 140/70 mmHg  Pulse 76  Resp 12  Ht 5\' 9"  (1.753 m)  Wt 172 lb (78.019 kg)  BMI 25.39 kg/m2   Musculoskeletal: Strength & Muscle Tone: within normal limits Gait & Station: normal Patient leans: N/A  Psychiatric Specialty Exam: General Appearance: Casual  Eye Contact::  Fair  Speech:  Slow  Volume:  Decreased  Mood:  Depressed and Dysphoric  Affect:  Constricted and Depressed  Thought Process:  Coherent  Orientation:  Full (Time, Place, and Person)  Thought Content:  Hallucinations: People calling his name  Suicidal Thoughts:  No  Homicidal Thoughts:  No  Memory:  Immediate;   Fair Recent;   Good Remote;   Good  Judgement:  Good  Insight:  Good  Psychomotor Activity:  Decreased  Concentration:  Good  Recall:  [place] of Knowledge:  Good  Language:  Fair  Akathisia:  No  Handed:  Right  AIMS (if indicated):     Assets:  Communication Skills Desire for Improvement Financial Resources/Insurance Housing  ADL's:  Intact  Cognition:  WNL  Sleep:        Established Problem, Stable/Improving (1), Review of Psycho-Social Stressors (1), Review or order clinical lab tests (1), Decision to obtain old records (1), Review and summation of old records (2), Established Problem, Worsening (2), New Problem, with no additional work-up planned (3), Review of Medication Regimen & Side Effects (2) and Review of New Medication or Change in  Dosage (2)  Assessment: 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 Hypertension   . Diabetes mellitus without complication ([place])   . High cholesterol   . GERD (gastroesophageal reflux disease)   . Back pain     intermittent-prior military injury  . BPH (benign prostatic hypertrophy) with urinary retention      Plan:  I review his symptoms, history, current medication, psychosocial stressors and collateral information.  Patient scored 25 on depression inventory.  I had a long discussion with the patient about starting antidepressant.  He has some concern about the side effects however he like to try low-dose antidepressant at this time.  I recommended to try Cymbalta 20 mg daily for 1 week and then 40 mg daily to help his depression, anxiety and may have extra benefit to help his back pain.  Discussed medication side effects especially in the beginning it may cause worsening of suicidal thoughts, shakes, tremors, headaches and GI symptoms.  I strongly encouraged to keep appointment with Dr. Cheryln Manly for coping and social skills.  We will get records from his primary care physician including recent blood work results.  Patient had appointment to see his primary care physician tomorrow.  Recommended to call us back if he has any question, concern if he feel worsening of the symptom.  Discuss safety plan that anytime having active suicidal thoughts or homicidal thought that he need to call 911 or go to the local emergency room.  I will see him again in 3 weeks.  Isidoro Santillana T., MD 12/14/2014

## 2015-01-25 DIAGNOSIS — Z0000 Encounter for general adult medical examination without abnormal findings: Secondary | ICD-10-CM | POA: Diagnosis not present

## 2015-01-25 DIAGNOSIS — N401 Enlarged prostate with lower urinary tract symptoms: Secondary | ICD-10-CM | POA: Diagnosis not present

## 2015-01-25 DIAGNOSIS — N5201 Erectile dysfunction due to arterial insufficiency: Secondary | ICD-10-CM | POA: Diagnosis not present

## 2015-01-25 DIAGNOSIS — R972 Elevated prostate specific antigen [PSA]: Secondary | ICD-10-CM | POA: Diagnosis not present

## 2015-01-25 DIAGNOSIS — R338 Other retention of urine: Secondary | ICD-10-CM | POA: Diagnosis not present

## 2015-01-30 ENCOUNTER — Ambulatory Visit (INDEPENDENT_AMBULATORY_CARE_PROVIDER_SITE_OTHER): Payer: PPO | Admitting: Psychiatry

## 2015-01-30 ENCOUNTER — Encounter (HOSPITAL_COMMUNITY): Payer: Self-pay | Admitting: Psychiatry

## 2015-01-30 VITALS — BP 130/78 | HR 94 | Ht 69.0 in | Wt 173.0 lb

## 2015-01-30 DIAGNOSIS — F329 Major depressive disorder, single episode, unspecified: Secondary | ICD-10-CM | POA: Diagnosis not present

## 2015-01-30 DIAGNOSIS — F39 Unspecified mood [affective] disorder: Secondary | ICD-10-CM

## 2015-01-30 DIAGNOSIS — F32A Depression, unspecified: Secondary | ICD-10-CM

## 2015-01-30 MED ORDER — DULOXETINE HCL 30 MG PO CPEP: 30.0000 mg | ORAL_CAPSULE | Freq: Every day | ORAL | Status: DC

## 2015-01-30 NOTE — Progress Notes (Signed)
[place] [phone] Progress Note  Ricky Shields IN:[id number] 70 y.o.  01/30/2015 11:28 AM  Chief Complaint:  I like medication.  I'm taking only as needed.  I still have some anxiety and depression.    History of Present Illness:  Ricky Shields came for his follow-up appointment.  He was seen first time on November 30 as initial evaluation.  He is a 70 year old African-American married man who is referred from his therapist.  He was complaining of increased depression, sadness, passive suicidal thoughts, discouragement and lack of interest in his life.  His major stressor is chronic pain which has caused significant impairment in his daily life.  He has lost his work 15 months ago because he could not function.  We started him on Cymbalta and recommended to increase 40 mg after one week.  However patient is not taking on a regular basis.  He is taking 20 mg to 3 times a week and sometime 40 mg 1 time a week.  However he admitted once to start taking Cymbalta he is seen improvement in his depression and anxiety.  He is no longer having any suicidal thoughts.  His attention and concentration is improved.  His energy level is also improved from the past.  He had a good Christmas .  He was disappointed because his son did not came however he had a good time with his wife and his grandchild .  He denies any side effects including any tremors, shakes or any EPS.  He has not seen Dr. Cheryln Manly in a while.  He admitted some time poor sleep and racing thoughts but denies any hallucination, paranoia, crying spells, irritability or any anger.  His appetite is okay.  His vitals are stable.  He denies drinking or using any illegal substances.  He lives with his wife.  Suicidal Ideation: No Plan Formed: No Patient has means to carry out plan: No  Homicidal Ideation: No Plan Formed: No Patient has means to carry out plan: No  Past Psychiatric History/Hospitalization(s): Patient denies any previous history of  psychiatric inpatient treatment, mania, depression, hallucination, paranoia or any aggressive behavior.  He has never taken any psychiatric medications before.  He started seeing Dr. Cheryln Manly [place]. Anxiety: No Bipolar Disorder: No Depression: No Mania: No Psychosis: No Schizophrenia: No Personality Disorder: No Hospitalization for psychiatric illness: No History of Electroconvulsive Shock Therapy: No Prior Suicide Attempts: No  Medical History; Patient has chronic back pain, hypertension, BPH, diabetes, GERD.  His primary care physician is [facility].  His pain is managed by Dr. Herold Harms.  Family History; Patient denies any family history of psychiatric illness.  Review of Systems  Constitutional: Negative.   Cardiovascular: Negative for chest pain and palpitations.  Musculoskeletal: Positive for back pain and joint pain.  Skin: Negative for itching and rash.  Neurological: Negative for dizziness, tremors and headaches.  Psychiatric/Behavioral: The patient has insomnia.     Psychiatric: Agitation: No Hallucination: No Depressed Mood: No Insomnia: Yes Hypersomnia: No Altered Concentration: No Feels Worthless: No Grandiose Ideas: No Belief In Special Powers: No New/Increased Substance Abuse: No Compulsions: No  Neurologic: Headache: No Seizure: No Paresthesias: No   Outpatient Encounter Prescriptions as of 01/30/2015  Medication Sig  . Amlodipine-Valsartan-HCTZ 5-160-12.5 MG TABS tablet by mouth once daily  . aspirin EC 81 MG tablet Take 1 tablet (81 mg total) by mouth daily.  . cholecalciferol (VITAMIN D) 1000 UNITS tablet Take 1,000 Units by mouth daily.  . DULoxetine (CYMBALTA)  30 MG capsule Take 1 capsule (30 mg total) by mouth daily.  Marland Kitchen FLUZONE HIGH-DOSE 0.5 ML SUSY inject 0.5 milliliter intramuscularly  . ibuprofen (ADVIL,MOTRIN) 600 MG tablet Take 600 mg by mouth every 6 (six) hours as needed.  . metFORMIN (GLUCOPHAGE) 500 MG tablet Take 500  mg by mouth 2 (two) times daily with a meal.   . methocarbamol (ROBAXIN) 500 MG tablet Take 1 tablet (500 mg total) by mouth 2 (two) times daily.  . Multiple Vitamin (MULTIVITAMIN WITH MINERALS) TABS tablet Take 1 tablet by mouth daily.  . ONE TOUCH ULTRA TEST test strip   . ranitidine (ZANTAC) 150 MG tablet Take 150 mg by mouth as needed for heartburn.  . simvastatin (ZOCOR) 40 MG tablet Take 40 mg by mouth at bedtime.  Marland Kitchen UROXATRAL 10 MG 24 hr tablet Take 10 mg by mouth daily.  . valACYclovir (VALTREX) 500 MG tablet   . [DISCONTINUED] DULoxetine (CYMBALTA) 20 MG capsule Take 1 capsule (20 mg total) by mouth 2 (two) times daily.   No facility-administered encounter medications on file as of 01/30/2015.    No results found for this or any previous visit (from the past 2160 hour(s)).    Constitutional:  BP 130/78 mmHg  Pulse 94  Ht 5\' 9"  (1.753 m)  Wt 173 lb (78.472 kg)  BMI 25.54 kg/m2   Musculoskeletal: Strength & Muscle Tone: within normal limits Gait & Station: normal Patient leans: N/A  Psychiatric Specialty Exam: General Appearance: Casual  Eye Contact::  Fair  Speech:  Slow  Volume:  Decreased  Mood:  Dysphoric  Affect:  Depressed  Thought Process:  Coherent  Orientation:  Full (Time, Place, and Person)  Thought Content:  WDL  Suicidal Thoughts:  No  Homicidal Thoughts:  No  Memory:  Immediate;   Fair Recent;   Good Remote;   Good  Judgement:  Good  Insight:  Good  Psychomotor Activity:  Decreased  Concentration:  Good  Recall:  [name] of Knowledge:  Good  Language:  Fair  Akathisia:  No  Handed:  Right  AIMS (if indicated):     Assets:  Communication Skills Desire for Improvement Financial Resources/Insurance Housing  ADL's:  Intact  Cognition:  WNL  Sleep:        Established Problem, Stable/Improving (1), Review of Psycho-Social Stressors (1), Decision to obtain old records (1), Review and summation of old records (2), Review of Last Therapy  Session (1), Review of Medication Regimen & Side Effects (2) and Review of New Medication or Change in Dosage (2)  Assessment: 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 Hypertension   . Diabetes mellitus without complication ([place])   . High cholesterol   . GERD (gastroesophageal reflux disease)   . Back pain     intermittent-prior military injury  . BPH (benign prostatic hypertrophy) with urinary retention      Plan:  Patient is tolerating Cymbalta however he is not taking every day.  Discuss in length medication side effects, benefits, long-term prognosis of his illness.  Encouraged to take Cymbalta at least 30 mg daily to be more effective.  I also encouraged to see Dr. Cheryln Manly for counseling and therapy.  Discussed medication side effects and benefits.  We are still awaiting records from his primary care physician including recent blood work results.  Discuss safety concern that anytime  having active suicidal thoughts or homicidal thoughts and he need to call 911 or go to a local emergency room.  I will see him again in 3 months.  Keni Wafer T., MD 01/30/2015

## 2015-02-21 ENCOUNTER — Telehealth (HOSPITAL_COMMUNITY): Payer: Self-pay

## 2015-02-22 NOTE — Telephone Encounter (Signed)
I returned his phone call and left a message.

## 2015-02-28 ENCOUNTER — Ambulatory Visit (INDEPENDENT_AMBULATORY_CARE_PROVIDER_SITE_OTHER): Payer: PPO | Admitting: Psychiatry

## 2015-02-28 ENCOUNTER — Encounter (HOSPITAL_COMMUNITY): Payer: Self-pay | Admitting: Psychiatry

## 2015-02-28 VITALS — BP 140/80 | HR 79 | Ht 69.0 in | Wt 172.6 lb

## 2015-02-28 DIAGNOSIS — F329 Major depressive disorder, single episode, unspecified: Secondary | ICD-10-CM | POA: Diagnosis not present

## 2015-02-28 DIAGNOSIS — F32A Depression, unspecified: Secondary | ICD-10-CM

## 2015-02-28 NOTE — Progress Notes (Signed)
[place] [phone] Progress Note  Ricky Shields IN:[id number] 70 y.o.  02/28/2015 11:24 AM  Chief Complaint:  I'm sleeping better with melatonin.      History of Present Illness:  Ricky Shields came earlier than his is scheduled appointment.  He is concerned about his medical record .  He started to get disability from [place] .  He mentioned today that he need records to be sent [facility] to start the paperwork for his disability.  Overall he described his depression is better since he is taking Cymbalta.  He has no side effects but in the beginning he do have some tremors and shakes.  He continues to have some time insomnia which she believed due to chronic pain.  However his energy level is better.  He admitted Cymbalta help his depression and he is not as sad and discouraged.  He is taking 30 mg Cymbalta every day.  We have recommended to take melatonin and he's taking 3 mg over the counter melatonin is helping his sleep.  Patient denies any paranoia, hallucination or any crying spells.  His attention and concentration is also improved from the past.  His energy level is also improved and he admitted few times played with his 34-year-old grandson.  He has not seen Dr. Cheryln Manly but promised to see him in the future for counseling.  Patient denies drinking or using any illegal substances.  Patient denies any active or passive suicidal parts or homicidal thought.  His appetite is okay.  His vitals are stable.  Patient lives with his wife.  Suicidal Ideation: No Plan Formed: No Patient has means to carry out plan: No  Homicidal Ideation: No Plan Formed: No Patient has means to carry out plan: No  Past Psychiatric History/Hospitalization(s): Patient denies any previous history of psychiatric inpatient treatment, mania, depression, hallucination, paranoia or any aggressive behavior.  He has never taken any psychiatric medications before.  He started seeing Dr. Cheryln Manly [place]. Anxiety: No Bipolar  Disorder: No Depression: No Mania: No Psychosis: No Schizophrenia: No Personality Disorder: No Hospitalization for psychiatric illness: No History of Electroconvulsive Shock Therapy: No Prior Suicide Attempts: No  Medical History; Patient has chronic back pain, hypertension, BPH, diabetes, GERD.  He remember his back pain started when he was in [place] in 1971.  He was hospitalized twice in [place] due to severe back pain which he recalled due to heavy lifting.  His primary care physician is [facility].  His pain is managed by Dr. Herold Harms.  Family History; Patient denies any family history of psychiatric illness.  Review of Systems  Constitutional: Negative.   Cardiovascular: Negative for chest pain and palpitations.  Musculoskeletal: Positive for back pain and joint pain.  Skin: Negative for itching and rash.  Neurological: Negative for dizziness, tremors and headaches.  Psychiatric/Behavioral: The patient has insomnia.     Psychiatric: Agitation: No Hallucination: No Depressed Mood: No Insomnia: Yes Hypersomnia: No Altered Concentration: No Feels Worthless: No Grandiose Ideas: No Belief In Special Powers: No New/Increased Substance Abuse: No Compulsions: No  Neurologic: Headache: No Seizure: No Paresthesias: No   Outpatient Encounter Prescriptions as of 02/28/2015  Medication Sig  . Melatonin 3 MG TABS Take 3 mg by mouth.  . Amlodipine-Valsartan-HCTZ 5-160-12.5 MG TABS tablet by mouth once daily  . aspirin EC 81 MG tablet Take 1 tablet (81 mg total) by mouth daily.  . cholecalciferol (VITAMIN D) 1000 UNITS tablet Take 1,000 Units by mouth daily.  . DULoxetine (CYMBALTA) 30  MG capsule Take 1 capsule (30 mg total) by mouth daily.  Marland Kitchen FLUZONE HIGH-DOSE 0.5 ML SUSY inject 0.5 milliliter intramuscularly  . ibuprofen (ADVIL,MOTRIN) 600 MG tablet Take 600 mg by mouth every 6 (six) hours as needed.  . metFORMIN (GLUCOPHAGE) 500 MG tablet Take 500 mg by mouth 2  (two) times daily with a meal.   . methocarbamol (ROBAXIN) 500 MG tablet Take 1 tablet (500 mg total) by mouth 2 (two) times daily.  . Multiple Vitamin (MULTIVITAMIN WITH MINERALS) TABS tablet Take 1 tablet by mouth daily.  . ONE TOUCH ULTRA TEST test strip   . ranitidine (ZANTAC) 150 MG tablet Take 150 mg by mouth as needed for heartburn.  . simvastatin (ZOCOR) 40 MG tablet Take 40 mg by mouth at bedtime.  Marland Kitchen UROXATRAL 10 MG 24 hr tablet Take 10 mg by mouth daily.  . valACYclovir (VALTREX) 500 MG tablet    No facility-administered encounter medications on file as of 02/28/2015.    No results found for this or any previous visit (from the past 2160 hour(s)).    Constitutional:  BP 140/80 mmHg  Pulse 79  Ht 5\' 9"  (1.753 m)  Wt 172 lb 9.6 oz (78.291 kg)  BMI 25.48 kg/m2   Musculoskeletal: Strength & Muscle Tone: within normal limits Gait & Station: normal Patient leans: N/A  Psychiatric Specialty Exam: General Appearance: Casual  Eye Contact::  Fair  Speech:  Slow  Volume:  Decreased  Mood:  Dysphoric  Affect:  Depressed  Thought Process:  Coherent  Orientation:  Full (Time, Place, and Person)  Thought Content:  WDL  Suicidal Thoughts:  No  Homicidal Thoughts:  No  Memory:  Immediate;   Fair Recent;   Good Remote;   Good  Judgement:  Good  Insight:  Good  Psychomotor Activity:  Decreased  Concentration:  Good  Recall:  [place] of Knowledge:  Good  Language:  Fair  Akathisia:  No  Handed:  Right  AIMS (if indicated):     Assets:  Communication Skills Desire for Improvement Financial Resources/Insurance Housing  ADL's:  Intact  Cognition:  WNL  Sleep:        Established Problem, Stable/Improving (1), Review of Psycho-Social Stressors (1), Decision to obtain old records (1), Review and summation of old records (2), Review of Last Therapy Session (1), Review of Medication Regimen & Side Effects (2) and Review of New Medication or Change in Dosage  (2)  Assessment: Axis I: Depressive disorder NOS.  Mood disorder due to general medical condition (patient has back pain since 1971 due to heavy lifting)  Axis II: Deferred  Axis III:  Past Medical History  Diagnosis Date  . Hypertension   . Diabetes mellitus without complication ([place])   . High cholesterol   . GERD (gastroesophageal reflux disease)   . Back pain     intermittent-prior military injury  . BPH (benign prostatic hypertrophy) with urinary retention      Plan:  Patient is tolerating Cymbalta  30 mg daily.  Patient does not want to take a higher dose since he remember causing tremors with higher dose.  His sleep is better since he is taking melatonin 3 mg.  I strongly encouraged to see Dr. Cheryln Manly for counseling.  Discussed medication side effects and benefits.  We are still awaiting medical records from his primary care physician including blood work.  Recommended to call us back if he has any question or any concern.  Discuss safety concern  that anytime having active suicidal thoughts or homicidal thoughts and he need to call 911 or go to a local emergency room.  I will see him again in 3 months.  ARFEEN,SYED T., MD 02/28/2015

## 2015-03-15 DIAGNOSIS — R972 Elevated prostate specific antigen [PSA]: Secondary | ICD-10-CM | POA: Diagnosis not present

## 2015-03-22 DIAGNOSIS — Z0000 Encounter for general adult medical examination without abnormal findings: Secondary | ICD-10-CM | POA: Diagnosis not present

## 2015-03-22 DIAGNOSIS — R972 Elevated prostate specific antigen [PSA]: Secondary | ICD-10-CM | POA: Diagnosis not present

## 2015-03-22 DIAGNOSIS — N138 Other obstructive and reflux uropathy: Secondary | ICD-10-CM | POA: Diagnosis not present

## 2015-03-22 DIAGNOSIS — N401 Enlarged prostate with lower urinary tract symptoms: Secondary | ICD-10-CM | POA: Diagnosis not present

## 2015-03-22 DIAGNOSIS — R338 Other retention of urine: Secondary | ICD-10-CM | POA: Diagnosis not present

## 2015-04-05 DIAGNOSIS — I10 Essential (primary) hypertension: Secondary | ICD-10-CM | POA: Diagnosis not present

## 2015-04-05 DIAGNOSIS — E119 Type 2 diabetes mellitus without complications: Secondary | ICD-10-CM | POA: Diagnosis not present

## 2015-04-05 DIAGNOSIS — E784 Other hyperlipidemia: Secondary | ICD-10-CM | POA: Diagnosis not present

## 2015-05-01 ENCOUNTER — Ambulatory Visit (HOSPITAL_COMMUNITY): Payer: Self-pay | Admitting: Psychiatry

## 2015-05-30 ENCOUNTER — Ambulatory Visit (INDEPENDENT_AMBULATORY_CARE_PROVIDER_SITE_OTHER): Payer: PPO | Admitting: Psychiatry

## 2015-05-30 ENCOUNTER — Encounter (HOSPITAL_COMMUNITY): Payer: Self-pay | Admitting: Psychiatry

## 2015-05-30 VITALS — BP 138/80 | HR 75 | Ht 69.0 in | Wt 172.6 lb

## 2015-05-30 DIAGNOSIS — F329 Major depressive disorder, single episode, unspecified: Secondary | ICD-10-CM | POA: Diagnosis not present

## 2015-05-30 DIAGNOSIS — F32A Depression, unspecified: Secondary | ICD-10-CM

## 2015-05-30 MED ORDER — DULOXETINE HCL 30 MG PO CPEP: 30.0000 mg | ORAL_CAPSULE | Freq: Every day | ORAL | Status: DC

## 2015-05-30 NOTE — Progress Notes (Signed)
[facility] Progress Note  Ricky Shields [id number] y.o.  05/30/2015 12:05 PM  Chief Complaint:  Medication management and follow-up.        History of Present Illness:  Ricky Shields came for his follow-up appointment.  He is taking Cymbalta 30 mg daily.  He's taking melatonin as needed for insomnia but is working very well.  He denies any crying spells, irritability, anger, mood swing.  He apologized not seeing Dr. Cheryln Manly for counseling but promised to make an appointment very soon.  He is a still waiting disability from [place] , he had submitted his papers waiting for the response.  Overall he described his mood is stable.  He feels Cymbalta working and helping his depression.  He denies any agitation sedation.  His appetite is okay.  His attention concentration is better.  He still have back pain but his energy level is improved from the past.  Patient denies drinking or using any illegal substances.  His vitals are stable.  He lives with his wife who is supportive.  Suicidal Ideation: No Plan Formed: No Patient has means to carry out plan: No  Homicidal Ideation: No Plan Formed: No Patient has means to carry out plan: No  Past Psychiatric History/Hospitalization(s): Patient denies any previous history of psychiatric inpatient treatment, mania, depression, hallucination, paranoia or any aggressive behavior.  He has never taken any psychiatric medications before.  He started seeing Dr. Cheryln Manly [place]. Anxiety: No Bipolar Disorder: No Depression: No Mania: No Psychosis: No Schizophrenia: No Personality Disorder: No Hospitalization for psychiatric illness: No History of Electroconvulsive Shock Therapy: No Prior Suicide Attempts: No  Medical History; Patient has chronic back pain, hypertension, BPH, diabetes, GERD.  He remember his back pain started when he was in [place] in 1971.  He was hospitalized twice in [place] due to severe back pain which he recalled due  to heavy lifting.  His primary care physician is [facility].  His pain is managed by Dr. Herold Harms.  Family History; Patient denies any family history of psychiatric illness.  Review of Systems  Constitutional: Negative.   Cardiovascular: Negative for chest pain and palpitations.  Musculoskeletal: Positive for back pain and joint pain.  Skin: Negative for itching and rash.  Neurological: Negative for dizziness, tremors and headaches.  Psychiatric/Behavioral: The patient has insomnia.     Psychiatric: Agitation: No Hallucination: No Depressed Mood: No Insomnia: Yes Hypersomnia: No Altered Concentration: No Feels Worthless: No Grandiose Ideas: No Belief In Special Powers: No New/Increased Substance Abuse: No Compulsions: No  Neurologic: Headache: No Seizure: No Paresthesias: No   Outpatient Encounter Prescriptions as of 05/30/2015  Medication Sig  . Amlodipine-Valsartan-HCTZ 5-160-12.5 MG TABS tablet by mouth once daily  . aspirin EC 81 MG tablet Take 1 tablet (81 mg total) by mouth daily.  . cholecalciferol (VITAMIN D) 1000 UNITS tablet Take 1,000 Units by mouth daily.  . DULoxetine (CYMBALTA) 30 MG capsule Take 1 capsule (30 mg total) by mouth daily.  Marland Kitchen FLUZONE HIGH-DOSE 0.5 ML SUSY inject 0.5 milliliter intramuscularly  . ibuprofen (ADVIL,MOTRIN) 600 MG tablet Take 600 mg by mouth every 6 (six) hours as needed.  . Melatonin 3 MG TABS Take 3 mg by mouth.  . metFORMIN (GLUCOPHAGE) 500 MG tablet Take 500 mg by mouth 2 (two) times daily with a meal.   . methocarbamol (ROBAXIN) 500 MG tablet Take 1 tablet (500 mg total) by mouth 2 (two) times daily.  . Multiple Vitamin (MULTIVITAMIN WITH MINERALS) TABS  tablet Take 1 tablet by mouth daily.  . ONE TOUCH ULTRA TEST test strip   . ranitidine (ZANTAC) 150 MG tablet Take 150 mg by mouth as needed for heartburn.  . simvastatin (ZOCOR) 40 MG tablet Take 40 mg by mouth at bedtime.  Marland Kitchen UROXATRAL 10 MG 24 hr tablet Take 10 mg  by mouth daily.  . valACYclovir (VALTREX) 500 MG tablet   . [DISCONTINUED] DULoxetine (CYMBALTA) 30 MG capsule Take 1 capsule (30 mg total) by mouth daily.   No facility-administered encounter medications on file as of 05/30/2015.    No results found for this or any previous visit (from the past 2160 hour(s)).    Constitutional:  BP 138/80 mmHg  Pulse 75  Ht 5\' 9"  (1.753 m)  Wt 172 lb 9.6 oz (78.291 kg)  BMI 25.48 kg/m2  SpO2 99%   Musculoskeletal: Strength & Muscle Tone: within normal limits Gait & Station: normal Patient leans: N/A  Psychiatric Specialty Exam: General Appearance: Casual  Eye Contact::  Fair  Speech:  Slow  Volume:  Decreased  Mood:  Dysphoric  Affect:  Depressed  Thought Process:  Coherent  Orientation:  Full (Time, Place, and Person)  Thought Content:  WDL  Suicidal Thoughts:  No  Homicidal Thoughts:  No  Memory:  Immediate;   Fair Recent;   Good Remote;   Good  Judgement:  Good  Insight:  Good  Psychomotor Activity:  Decreased  Concentration:  Good  Recall:  [place] of Knowledge:  Good  Language:  Fair  Akathisia:  No  Handed:  Right  AIMS (if indicated):     Assets:  Communication Skills Desire for Improvement Financial Resources/Insurance Housing  ADL's:  Intact  Cognition:  WNL  Sleep:        Established Problem, Stable/Improving (1), Review of Psycho-Social Stressors (1), Review of Last Therapy Session (1) and Review of Medication Regimen & Side Effects (2)  Assessment: Axis I: Depressive disorder NOS.  Mood disorder due to general medical condition (patient has back pain since 1971 due to heavy lifting)  Axis II: Deferred  Axis III:  Past Medical History  Diagnosis Date  . Hypertension   . Diabetes mellitus without complication ([place])   . High cholesterol   . GERD (gastroesophageal reflux disease)   . Back pain     intermittent-prior military injury  . BPH (benign prostatic hypertrophy) with urinary retention       Plan:  Patient is Doing better on Cymbalta 30 mg daily.  He does not want to take a higher dose since he remember causing tremors with higher dose.  I strongly encouraged to see Dr. Cheryln Manly for counseling.  Discussed medication side effects and benefits.  We are still awaiting medical records from his primary care physician including blood work.  Recommended to call us back if he has any question or any concern.  Discuss safety concern that anytime having active suicidal thoughts or homicidal thoughts and he need to call 911 or go to a local emergency room.  I will see him again in 3 months.  Jahel Wavra T., MD 05/30/2015

## 2015-06-21 DIAGNOSIS — I10 Essential (primary) hypertension: Secondary | ICD-10-CM | POA: Diagnosis not present

## 2015-06-21 DIAGNOSIS — E119 Type 2 diabetes mellitus without complications: Secondary | ICD-10-CM | POA: Diagnosis not present

## 2015-06-21 DIAGNOSIS — K219 Gastro-esophageal reflux disease without esophagitis: Secondary | ICD-10-CM | POA: Diagnosis not present

## 2015-08-31 ENCOUNTER — Ambulatory Visit (HOSPITAL_COMMUNITY): Payer: Self-pay | Admitting: Psychiatry

## 2015-09-27 DIAGNOSIS — R338 Other retention of urine: Secondary | ICD-10-CM | POA: Diagnosis not present

## 2015-09-27 DIAGNOSIS — N401 Enlarged prostate with lower urinary tract symptoms: Secondary | ICD-10-CM | POA: Diagnosis not present

## 2015-09-27 DIAGNOSIS — N5201 Erectile dysfunction due to arterial insufficiency: Secondary | ICD-10-CM | POA: Diagnosis not present

## 2015-09-27 DIAGNOSIS — R350 Frequency of micturition: Secondary | ICD-10-CM | POA: Diagnosis not present

## 2015-09-27 DIAGNOSIS — R972 Elevated prostate specific antigen [PSA]: Secondary | ICD-10-CM | POA: Diagnosis not present

## 2015-10-20 ENCOUNTER — Ambulatory Visit (HOSPITAL_COMMUNITY): Payer: Self-pay | Admitting: Psychiatry

## 2015-11-22 ENCOUNTER — Ambulatory Visit (HOSPITAL_COMMUNITY): Payer: Self-pay | Admitting: Psychiatry

## 2015-11-29 ENCOUNTER — Ambulatory Visit (INDEPENDENT_AMBULATORY_CARE_PROVIDER_SITE_OTHER): Payer: PPO | Admitting: Psychiatry

## 2015-11-29 ENCOUNTER — Encounter (HOSPITAL_COMMUNITY): Payer: Self-pay | Admitting: Psychiatry

## 2015-11-29 DIAGNOSIS — F320 Major depressive disorder, single episode, mild: Secondary | ICD-10-CM

## 2015-11-29 MED ORDER — DULOXETINE HCL 20 MG PO CPEP: 20.0000 mg | ORAL_CAPSULE | Freq: Two times a day (BID) | ORAL | 2 refills | Status: DC

## 2015-11-29 NOTE — Progress Notes (Signed)
[facility] [phone] Progress Note  Ricky Shields [id number] y.o.  11/29/2015 11:15 AM  Chief Complaint:  I feel sometime depressed and isolated.  I think I need to go up on my medication.          History of Present Illness:  Ricky Shields came for his follow-up appointment.  He is taking Cymbalta 30 mg daily.  In the past I have recommended to increase the dose but he declined now he feels that he need a higher dose of medication.  Sometime he feel very depressed, sad, isolated and withdrawn.  However he denies any suicidal thoughts or homicidal thought.  He still taking melatonin which sometime helps his sleep.  He denies any irritability, anger, mania or any psychosis.  His major stress is his chronic back pain which comes and goes and he is unable to do any work.  He admitted not able to make appointment with Dr. Cheryln Manly for counseling but now he is given see just thoughts to schedule appointment for coping skills.  He lives with his wife who is very supportive.  He has no tremors, shakes or any EPS.  He denies drinking alcohol or using any illegal substances.  Suicidal Ideation: No Plan Formed: No Patient has means to carry out plan: No  Homicidal Ideation: No Plan Formed: No Patient has means to carry out plan: No  Past Psychiatric History/Hospitalization(s): Patient denies any previous history of psychiatric inpatient treatment, mania, depression, hallucination, paranoia or any aggressive behavior.  He has never taken any psychiatric medications before.  He started seeing Dr. Cheryln Manly [place]. Anxiety: No Bipolar Disorder: No Depression: No Mania: No Psychosis: No Schizophrenia: No Personality Disorder: No Hospitalization for psychiatric illness: No History of Electroconvulsive Shock Therapy: No Prior Suicide Attempts: No  Medical History; Patient has chronic back pain, hypertension, BPH, diabetes, GERD.  He remember his back pain started when he was in [place]  in 1971.  He was hospitalized twice in [place] due to severe back pain which he recalled due to heavy lifting.  His primary care physician is [facility].  His pain is managed by Dr. Herold Harms.  Family History; Patient denies any family history of psychiatric illness.  Review of Systems  Constitutional: Negative.   Eyes: Negative.   Respiratory: Negative.   Cardiovascular: Negative.  Negative for chest pain and palpitations.  Genitourinary: Negative.   Musculoskeletal: Positive for back pain and joint pain.  Skin: Negative for itching and rash.  Neurological: Negative.  Negative for headaches.  Endo/Heme/Allergies: Negative.   Psychiatric/Behavioral: Positive for depression. The patient has insomnia.     Psychiatric: Agitation: No Hallucination: No Depressed Mood: Yes Insomnia: Yes Hypersomnia: No Altered Concentration: No Feels Worthless: No Grandiose Ideas: No Belief In Special Powers: No New/Increased Substance Abuse: No Compulsions: No  Neurologic: Headache: No Seizure: No Paresthesias: No   Outpatient Encounter Prescriptions as of 11/29/2015  Medication Sig  . Amlodipine-Valsartan-HCTZ 5-160-12.5 MG TABS tablet by mouth once daily  . aspirin EC 81 MG tablet Take 1 tablet (81 mg total) by mouth daily.  . cholecalciferol (VITAMIN D) 1000 UNITS tablet Take 1,000 Units by mouth daily.  . DULoxetine (CYMBALTA) 20 MG capsule Take 1 capsule (20 mg total) by mouth 2 (two) times daily.  Marland Kitchen FLUZONE HIGH-DOSE 0.5 ML SUSY inject 0.5 milliliter intramuscularly  . ibuprofen (ADVIL,MOTRIN) 600 MG tablet Take 600 mg by mouth every 6 (six) hours as needed.  . Melatonin 3 MG TABS Take 3 mg  by mouth.  . metFORMIN (GLUCOPHAGE) 500 MG tablet Take 500 mg by mouth 2 (two) times daily with a meal.   . methocarbamol (ROBAXIN) 500 MG tablet Take 1 tablet (500 mg total) by mouth 2 (two) times daily.  . Multiple Vitamin (MULTIVITAMIN WITH MINERALS) TABS tablet Take 1 tablet by mouth  daily.  . ONE TOUCH ULTRA TEST test strip   . ranitidine (ZANTAC) 150 MG tablet Take 150 mg by mouth as needed for heartburn.  . simvastatin (ZOCOR) 40 MG tablet Take 40 mg by mouth at bedtime.  . tamsulosin (FLOMAX) 0.4 MG CAPS capsule Take 0.4 mg by mouth daily.  . valACYclovir (VALTREX) 500 MG tablet   . [DISCONTINUED] DULoxetine (CYMBALTA) 30 MG capsule Take 1 capsule (30 mg total) by mouth daily.  . [DISCONTINUED] UROXATRAL 10 MG 24 hr tablet Take 10 mg by mouth daily.   No facility-administered encounter medications on file as of 11/29/2015.     No results found for this or any previous visit (from the past 2160 hour(s)).    Constitutional:  BP 136/78   Pulse 79   Ht 5\' 9"  (1.753 m)   Wt 169 lb 12.8 oz (77 kg)   BMI 25.08 kg/m    Musculoskeletal: Strength & Muscle Tone: within normal limits Gait & Station: normal Patient leans: N/A  Psychiatric Specialty Exam: General Appearance: Casual  Eye Contact::  Good  Speech:  Slow  Volume:  Normal  Mood:  Depressed and Dysphoric  Affect:  Constricted  Thought Process:  Coherent  Orientation:  Full (Time, Place, and Person)  Thought Content:  WDL  Suicidal Thoughts:  No  Homicidal Thoughts:  No  Memory:  Immediate;   Fair Recent;   Good Remote;   Good  Judgement:  Good  Insight:  Good  Psychomotor Activity:  Decreased  Concentration:  Good  Recall:  [place] of Knowledge:  Good  Language:  Fair  Akathisia:  No  Handed:  Right  AIMS (if indicated):     Assets:  Communication Skills Desire for Improvement Financial Resources/Insurance Housing  ADL's:  Intact  Cognition:  WNL  Sleep:        Established Problem, Stable/Improving (1), Review of Psycho-Social Stressors (1), Review and summation of old records (2), Established Problem, Worsening (2), Review of Last Therapy Session (1), Review of Medication Regimen & Side Effects (2) and Review of New Medication or Change in Dosage (2)  Assessment: Axis I: .   Depressive disorder, recurrent mild .  Mood disorder due to general medical condition.  (patient has back pain since 1971 due to heavy lifting)  Axis II: Deferred  Axis III:  Past Medical History:  Diagnosis Date  . Back pain    intermittent-prior military injury  . BPH (benign prostatic hypertrophy) with urinary retention   . Diabetes mellitus without complication ([place])   . GERD (gastroesophageal reflux disease)   . High cholesterol   . Hypertension      Plan:  I review his charts, current medication, stressors.  He has not seen Dr. Cheryln Manly for counseling but now he is seriously thinking to make an appointment with him for therapy.  I would increase from 30 mg to 20 mg twice a day.  Discussed medication side effects and benefits.  Recommended to call us back if he has any question, concern if he feel worsening of the symptom.  Follow-up in 3 months.  Discuss safety plan that anytime having active suicidal thoughts or  homicidal thoughts and he need to call 911 or go to the local emergency room.   Jeremie Abdelaziz T., MD 11/29/2015

## 2015-12-22 DIAGNOSIS — I10 Essential (primary) hypertension: Secondary | ICD-10-CM | POA: Diagnosis not present

## 2015-12-22 DIAGNOSIS — M2550 Pain in unspecified joint: Secondary | ICD-10-CM | POA: Diagnosis not present

## 2015-12-22 DIAGNOSIS — E119 Type 2 diabetes mellitus without complications: Secondary | ICD-10-CM | POA: Diagnosis not present

## 2015-12-29 DIAGNOSIS — H2513 Age-related nuclear cataract, bilateral: Secondary | ICD-10-CM | POA: Diagnosis not present

## 2015-12-29 DIAGNOSIS — H47329 Drusen of optic disc, unspecified eye: Secondary | ICD-10-CM | POA: Diagnosis not present

## 2015-12-29 DIAGNOSIS — E119 Type 2 diabetes mellitus without complications: Secondary | ICD-10-CM | POA: Diagnosis not present

## 2016-01-26 DIAGNOSIS — R310 Gross hematuria: Secondary | ICD-10-CM | POA: Diagnosis not present

## 2016-01-27 ENCOUNTER — Emergency Department (HOSPITAL_COMMUNITY)
Admission: EM | Admit: 2016-01-27 | Discharge: 2016-01-27 | Disposition: A | Discharge status: Home / Self Care | Payer: PPO | Attending: Emergency Medicine | Admitting: Emergency Medicine

## 2016-01-27 DIAGNOSIS — R339 Retention of urine, unspecified: Secondary | ICD-10-CM | POA: Diagnosis not present

## 2016-01-27 DIAGNOSIS — E119 Type 2 diabetes mellitus without complications: Secondary | ICD-10-CM | POA: Insufficient documentation

## 2016-01-27 DIAGNOSIS — I10 Essential (primary) hypertension: Secondary | ICD-10-CM | POA: Insufficient documentation

## 2016-01-27 DIAGNOSIS — Z7982 Long term (current) use of aspirin: Secondary | ICD-10-CM | POA: Insufficient documentation

## 2016-01-27 DIAGNOSIS — Z79899 Other long term (current) drug therapy: Secondary | ICD-10-CM | POA: Insufficient documentation

## 2016-01-27 DIAGNOSIS — R319 Hematuria, unspecified: Secondary | ICD-10-CM | POA: Diagnosis not present

## 2016-01-27 DIAGNOSIS — Z7984 Long term (current) use of oral hypoglycemic drugs: Secondary | ICD-10-CM | POA: Diagnosis not present

## 2016-01-27 NOTE — ED Provider Notes (Signed)
[facility] DEPT Provider Note   CSN: [id number] Arrival date & time: 01/27/16  1101     History   Chief Complaint Chief Complaint  Patient presents with  . Urinary Retention    Catheter Obstruction    HPI Ricky Shields is a 71 y.o. male.  Patient presents for evaluation of decreased urine output. He has a history of prosthetic hypertrophy and underwent TURP a year ago. I started developing some bloody urine yesterday. Seen by Dr. [name] his urologist and the catheter was placed. Blood with o'clock this morning and he presents here with decreased urine output  HPI  Past Medical History:  Diagnosis Date  . Back pain    intermittent-prior military injury  . BPH (benign prostatic hypertrophy) with urinary retention   . Diabetes mellitus without complication ([place])   . GERD (gastroesophageal reflux disease)   . High cholesterol   . Hypertension     There are no active problems to display for this patient.   Past Surgical History:  Procedure Laterality Date  . COLONOSCOPY    . GREEN LIGHT LASER TURP (TRANSURETHRAL RESECTION OF PROSTATE N/A 10/21/2014   Procedure: GREEN LIGHT LASER TURP (TRANSURETHRAL RESECTION OF PROSTATE;  Surgeon: Festus Aloe, MD;  Location: [facility];  Service: Urology;  Laterality: N/A;       Home Medications    Prior to Admission medications   Medication Sig Start Date End Date Taking? Authorizing Provider  Amlodipine-Valsartan-HCTZ 5-160-12.5 MG TABS tablet by mouth once daily 11/21/14  Yes Historical Provider, MD  cholecalciferol (VITAMIN D) 1000 UNITS tablet Take 1,000 Units by mouth daily.   Yes Historical Provider, MD  metFORMIN (GLUCOPHAGE) 500 MG tablet Take 500 mg by mouth 2 (two) times daily with a meal.  11/02/12  Yes Historical Provider, MD  Multiple Vitamin (MULTIVITAMIN WITH MINERALS) TABS tablet Take 1 tablet by mouth daily.   Yes Historical Provider, MD  ranitidine (ZANTAC) 150 MG tablet Take 150 mg by  mouth as needed for heartburn.   Yes Historical Provider, MD  simvastatin (ZOCOR) 40 MG tablet Take 40 mg by mouth at bedtime. 11/02/12  Yes Historical Provider, MD  tamsulosin (FLOMAX) 0.4 MG CAPS capsule Take 0.4 mg by mouth daily. 11/20/15  Yes Historical Provider, MD  valACYclovir (VALTREX) 500 MG tablet  11/21/14  Yes Historical Provider, MD  aspirin EC 81 MG tablet Take 1 tablet (81 mg total) by mouth daily. Patient not taking: Reported on 01/27/2016 10/23/14   Festus Aloe, MD  DULoxetine (CYMBALTA) 20 MG capsule Take 1 capsule (20 mg total) by mouth 2 (two) times daily. Patient not taking: Reported on 01/27/2016 11/29/15   Kathlee Nations, MD  FLUZONE HIGH-DOSE 0.5 ML SUSY inject 0.5 milliliter intramuscularly 09/28/14   Historical Provider, MD  methocarbamol (ROBAXIN) 500 MG tablet Take 1 tablet (500 mg total) by mouth 2 (two) times daily. Patient not taking: Reported on 01/27/2016 11/23/12   Marissa Sciacca, PA-C  ONE TOUCH ULTRA TEST test strip  09/14/14   Historical Provider, MD    Family History Family History  Problem Relation Age of Onset  . Stroke Mother   . Stroke Father     Social History Social History  Substance Use Topics  . Smoking status: Never Smoker  . Smokeless tobacco: Not on file  . Alcohol use No     Allergies   Penicillins   Review of Systems Review of Systems  Constitutional: Negative for appetite change, chills, diaphoresis, fatigue and fever.  HENT: Negative for mouth sores, sore throat and trouble swallowing.   Eyes: Negative for visual disturbance.  Respiratory: Negative for cough, chest tightness, shortness of breath and wheezing.   Cardiovascular: Negative for chest pain.  Gastrointestinal: Negative for abdominal distention, abdominal pain, diarrhea, nausea and vomiting.  Endocrine: Negative for polydipsia, polyphagia and polyuria.  Genitourinary: Positive for decreased urine volume and hematuria. Negative for dysuria and frequency.    Musculoskeletal: Negative for gait problem.  Skin: Negative for color change, pallor and rash.  Neurological: Negative for dizziness, syncope, light-headedness and headaches.  Hematological: Does not bruise/bleed easily.  Psychiatric/Behavioral: Negative for behavioral problems and confusion.     Physical Exam Updated Vital Signs BP 161/72 (BP Location: Right Arm)   Pulse 89   Temp 98.8 F (37.1 C) (Oral)   Resp 16   SpO2 98%   Physical Exam  Constitutional: He is oriented to person, place, and time. He appears well-developed and well-nourished. No distress.  HENT:  Head: Normocephalic.  Eyes: Conjunctivae are normal. Pupils are equal, round, and reactive to light. No scleral icterus.  Neck: Normal range of motion. Neck supple. No thyromegaly present.  Cardiovascular: Normal rate and regular rhythm.  Exam reveals no gallop and no friction rub.   No murmur heard. Pulmonary/Chest: Effort normal and breath sounds normal. No respiratory distress. He has no wheezes. He has no rales.  Abdominal: Soft. Bowel sounds are normal. He exhibits no distension. There is no tenderness. There is no rebound.  Genitourinary:  Genitourinary Comments: Foley catheter. Is placed a leg bag. Blood and clots in the bag.  Musculoskeletal: Normal range of motion.  Neurological: He is alert and oriented to person, place, and time.  Skin: Skin is warm and dry. No rash noted.  Psychiatric: He has a normal mood and affect. His behavior is normal.     ED Treatments / Results  Labs (all labs ordered are listed, but only abnormal results are displayed) Labs Reviewed - No data to display  EKG  EKG Interpretation None       Radiology No results found.  Procedures Procedures (including critical care time)  Medications Ordered in ED Medications - No data to display   Initial Impression / Assessment and Plan / ED Course  I have reviewed the triage vital signs and the nursing notes.  Pertinent  labs & imaging results that were available during my care of the patient were reviewed by me and considered in my medical decision making (see chart for details).  Clinical Course    Catheter was irrigated free and is draining clear urine now. I've advised him to use the tubing and the larger bag at night. I think he may have obstructed due to the leg bag at night plugging the valve.  He is appropriate for outpatient follow-up with Dr. [name] for his planned additional studies including outpatient CT scan   Final Clinical Impressions(s) / ED Diagnoses   Final diagnoses:  Hematuria, unspecified type  Urinary retention    New Prescriptions New Prescriptions   No medications on file     Tanna Furry, MD 01/27/16 1257

## 2016-01-27 NOTE — Discharge Instructions (Signed)
Drink plenty of fluids.  Use large urine bag at night  Follow u[ with Dr. [name] for planned studies.

## 2016-01-27 NOTE — ED Notes (Signed)
Irrigated pt foley with 1 liter sterile water and 60cc syringe. Foley flushed easily and pulled back a large amount of blood clots several times. Fluid from foley appeared to flush more clear and catheter drained w/o difficulty. Dr Jeneen Rinks made aware and primary RN Frederico Hamman assisted with procedure.

## 2016-01-27 NOTE — ED Triage Notes (Signed)
Pt c/o urinary catheter obstruction. Catheter bag full of dark bloody urine, will not drain due to blood clot obstruction, and urinary catheter does not appear to be draining onset this morning. Pt noted hematuria onset yesterday, saw PCP, had catheter placed, clots were draining from catheter so pt had catheter flushed multiple times and given antibiotic. Hx enlarged prostate, had prostate surgically shaved down with laser 1.5 years ago.

## 2016-01-27 NOTE — ED Notes (Signed)
ED Provider at bedside. 

## 2016-02-14 DIAGNOSIS — R310 Gross hematuria: Secondary | ICD-10-CM | POA: Diagnosis not present

## 2016-02-14 DIAGNOSIS — N400 Enlarged prostate without lower urinary tract symptoms: Secondary | ICD-10-CM | POA: Diagnosis not present

## 2016-02-23 ENCOUNTER — Other Ambulatory Visit: Payer: Self-pay

## 2016-02-23 DIAGNOSIS — R310 Gross hematuria: Secondary | ICD-10-CM | POA: Diagnosis not present

## 2016-02-23 DIAGNOSIS — N400 Enlarged prostate without lower urinary tract symptoms: Secondary | ICD-10-CM | POA: Diagnosis not present

## 2016-02-23 DIAGNOSIS — N401 Enlarged prostate with lower urinary tract symptoms: Secondary | ICD-10-CM | POA: Diagnosis not present

## 2016-02-23 DIAGNOSIS — R3912 Poor urinary stream: Secondary | ICD-10-CM | POA: Diagnosis not present

## 2016-03-05 ENCOUNTER — Ambulatory Visit (HOSPITAL_COMMUNITY): Payer: Self-pay | Admitting: Psychiatry

## 2016-05-24 IMAGING — MR MR PROSTATE WO/W CM
23 of 53 series · 23 of 53 positions shown · IV contrast (yes)
Comparison: None.

CLINICAL DATA: Elevated PSA.  Prior benign biopsy.

EXAM:
MR PROSTATE WITHOUT AND WITH CONTRAST
TECHNIQUE: Multiplanar multisequence MRI images were obtained of the pelvis
centered about the prostate. Pre and post contrast images were
obtained.
CONTRAST:  16mL MULTIHANCE GADOBENATE DIMEGLUMINE 529 MG/ML IV SOLN

[Series 3: bSSFP fat-sat · axial · 6.0mm · 0.86mm/px · 1 of 44 slices shown]
[im 1/44]
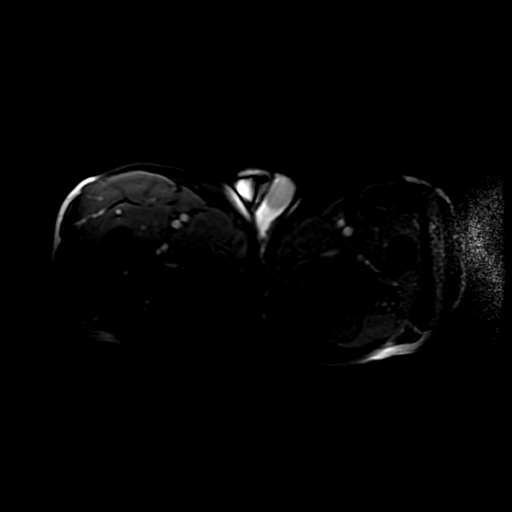

[Series 4: T1 · axial · 6.0mm · 0.86mm/px · 1 of 44 slices shown (1 of 2)]
[im 1/44]
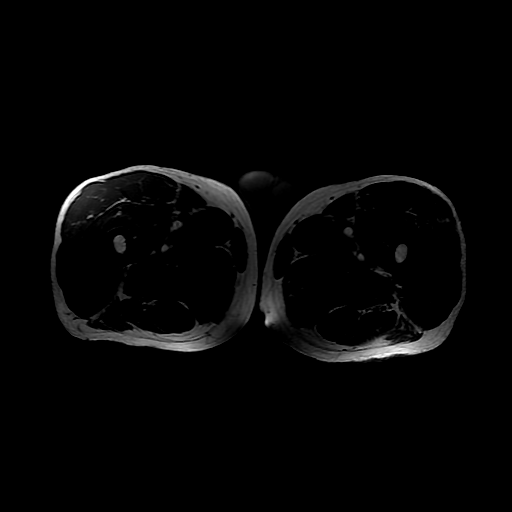

[Series 5: T2 · axial · 3.0mm · 0.29mm/px · 1 of 31 slices shown (1 of 3)]
[im 1/31]
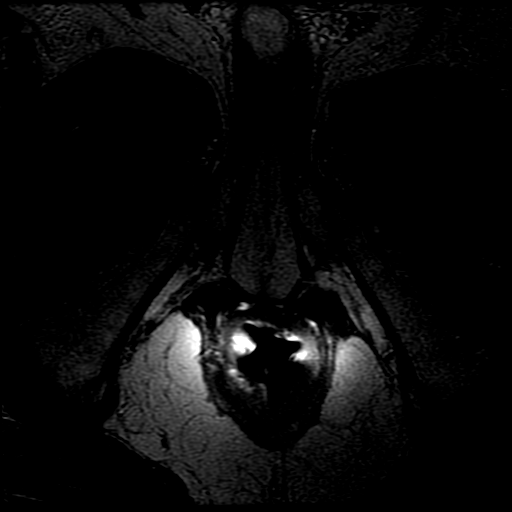

[Series 6: T1 · axial · 3.0mm · 0.29mm/px · 1 of 31 slices shown (2 of 2)]
[im 1/31]
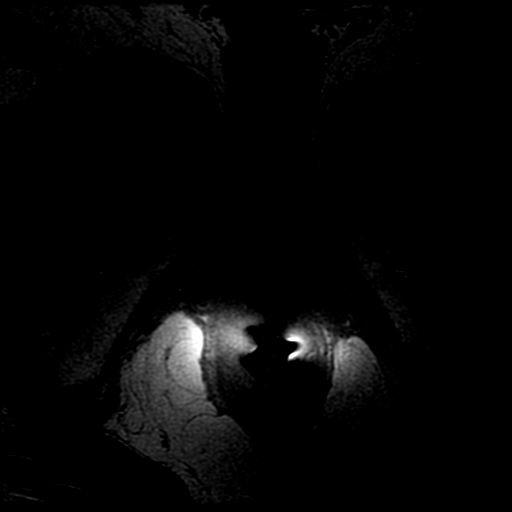

[Series 7: T2 · sagittal · 4.0mm · 0.29mm/px · 1 of 22 slices shown (2 of 3)]
[im 1/22]
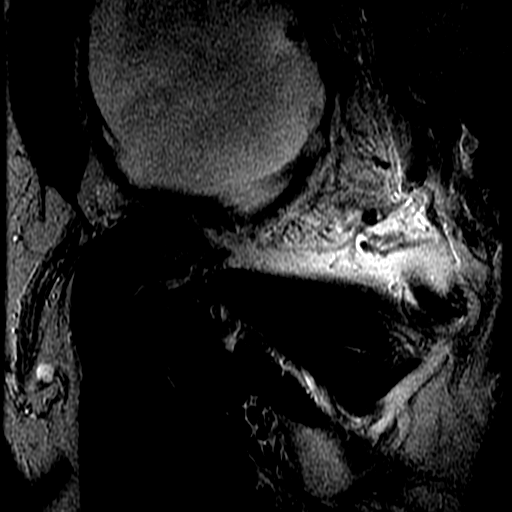

[Series 8: T2 · coronal · 4.0mm · 0.29mm/px · 1 of 24 slices shown (3 of 3)]
[im 1/24]
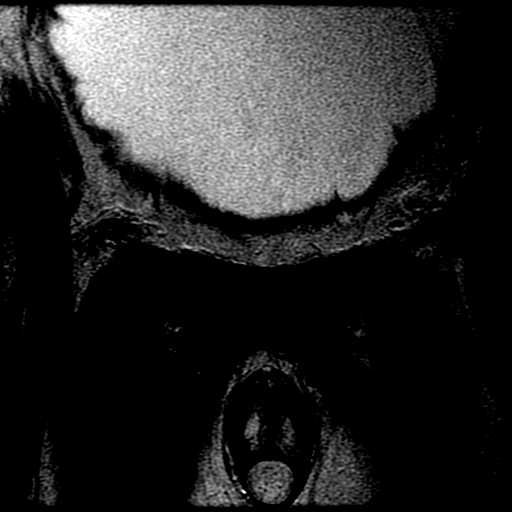

[Series 9: DWI · axial · 3.0mm · 0.59mm/px · 1 of 48 slices shown (1 of 6)]
[im 1/48]
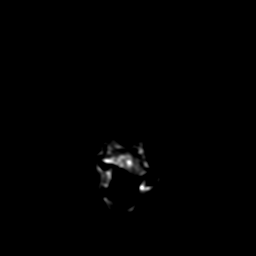

[Series 10: DWI · axial · 3.0mm · 0.59mm/px · 1 of 47 slices shown (2 of 6)]
[im 1/47]
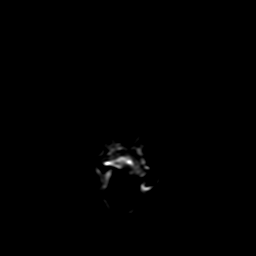

[Series 11: DWI · axial · 3.0mm · 0.59mm/px · 1 of 46 slices shown (3 of 6)]
[im 1/46]
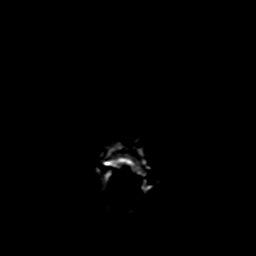

[Series 900: DWI · axial · 3.0mm · 0.59mm/px · 1 of 24 slices shown (4 of 6)]
[im 1/24]
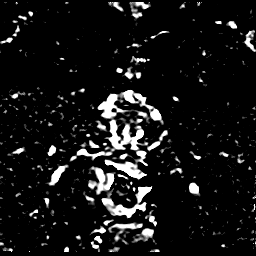

[Series 1000: DWI · axial · 3.0mm · 0.59mm/px · 1 of 24 slices shown (5 of 6)]
[im 1/24]
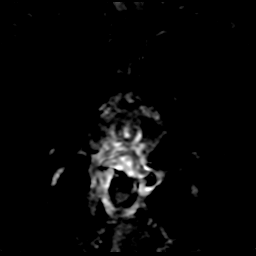

[Series 1100: DWI · axial · 3.0mm · 0.59mm/px · 1 of 24 slices shown (6 of 6)]
[im 1/24]
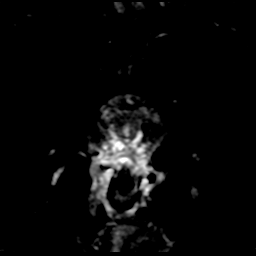

[((id)/(id)/1)-((id)/(id)/1) · axial · 3.0mm · 0.43mm/px · 1 of 68 slices shown (1 of 11)]
[im 1/68]
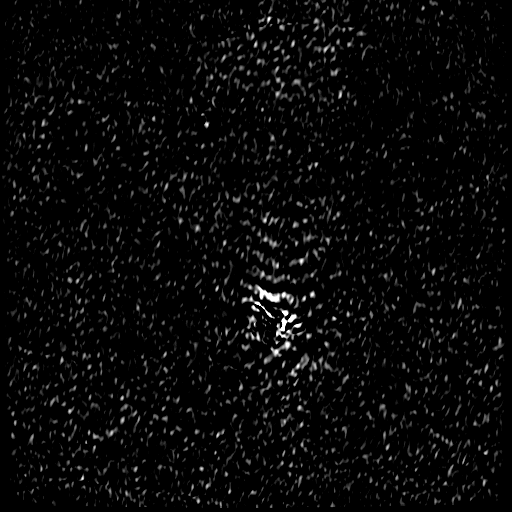

[((id)/(id)/1)-((id)/(id)/1) · axial · 3.0mm · 0.43mm/px · 1 of 72 slices shown (2 of 11)]
[im 1/72]
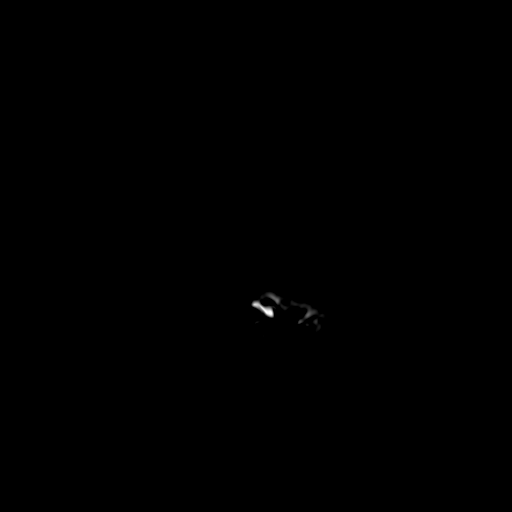

[((id)/(id)/1)-((id)/(id)/1) · axial · 3.0mm · 0.43mm/px · 1 of 72 slices shown (3 of 11)]
[im 1/72]
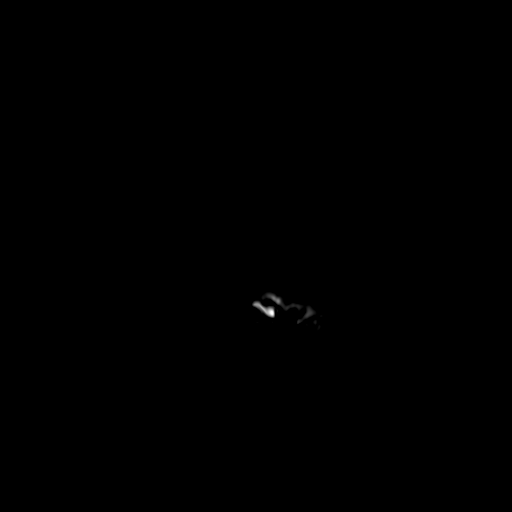

[((id)/(id)/1)-((id)/(id)/1) · axial · 3.0mm · 0.43mm/px · 1 of 72 slices shown (4 of 11)]
[im 1/72]
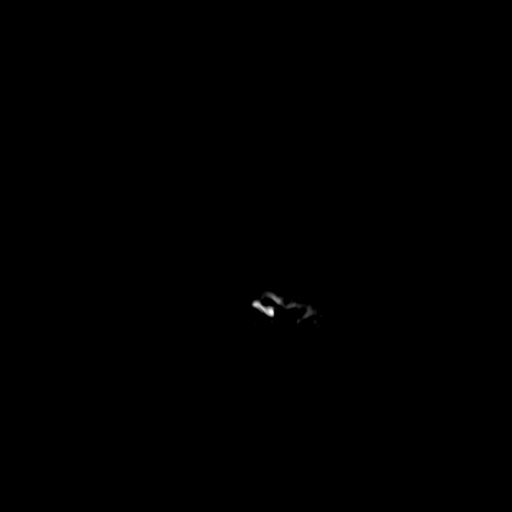

[((id)/(id)/1)-((id)/(id)/1) · axial · 3.0mm · 0.43mm/px · 1 of 72 slices shown (5 of 11)]
[im 1/72]
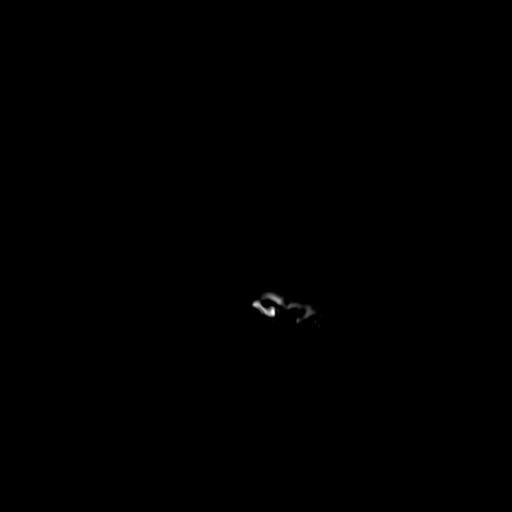

[((id)/(id)/1)-((id)/(id)/1) · axial · 3.0mm · 0.43mm/px · 1 of 72 slices shown (6 of 11)]
[im 1/72]
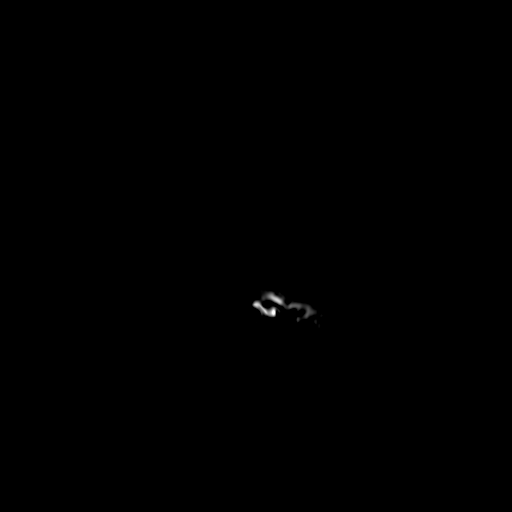

[((id)/(id)/1)-((id)/(id)/1) · axial · 3.0mm · 0.43mm/px · 1 of 72 slices shown (7 of 11)]
[im 1/72]
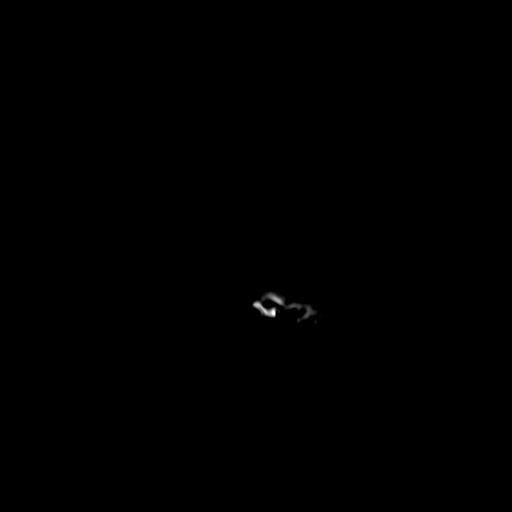

[((id)/(id)/1)-((id)/(id)/1) · axial · 3.0mm · 0.43mm/px · 1 of 72 slices shown (8 of 11)]
[im 1/72]
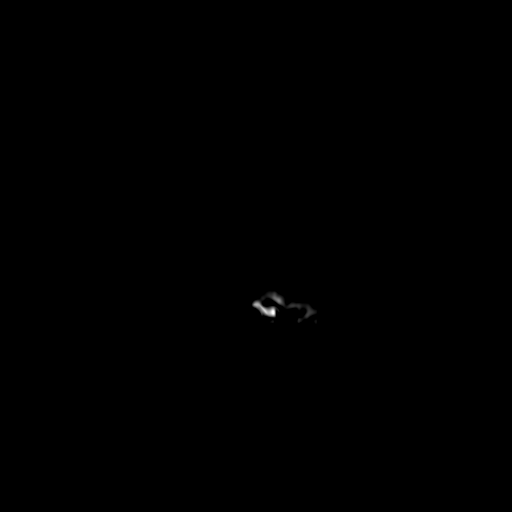

[((id)/(id)/1)-((id)/(id)/1) · axial · 3.0mm · 0.43mm/px · 1 of 72 slices shown (9 of 11)]
[im 1/72]
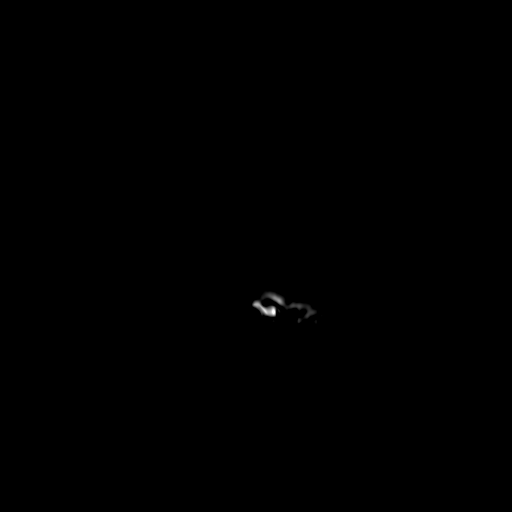

[((id)/(id)/1)-((id)/(id)/1) · axial · 3.0mm · 0.43mm/px · 1 of 71 slices shown (10 of 11)]
[im 1/71]
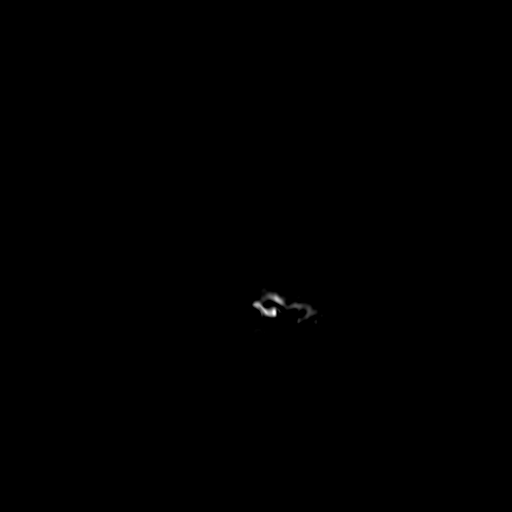

[((id)/(id)/1)-((id)/(id)/1) · axial · 3.0mm · 0.43mm/px · 1 of 72 slices shown (11 of 11)]
[im 1/72]
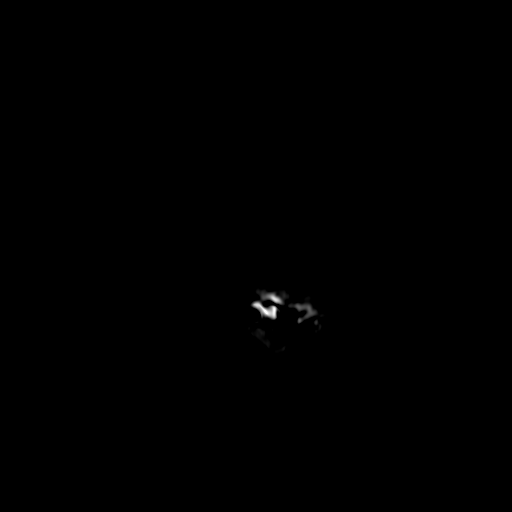

[23 of 53 positions shown; findings below may reference images not displayed]

FINDINGS: Prostate: Moderate to marked central gland enlargement and
heterogeneity, including prominence of the median lobe. This causes
compression of the peripheral zone, mildly degrading evaluation.
Given this factor, no areas of T2 hypointensity identified within
the peripheral zone. No areas of restricted diffusion or definite
early post-contrast enhancement.

Transcapsular spread:  Absent

Seminal vesicle involvement: Absent

Neurovascular bundle involvement: Absent

Pelvic adenopathy: Absent; left external iliac node measures 9 mm on
image 21 of series 3, upper normal size.

Bone metastasis: Absent

Other findings: Bladder distension with wall irregularity, including
on image 11 of series 3. No significant free fluid. Small bilateral
hydroceles may be physiologic on image 41 of series 3.
IMPRESSION: 1. Moderate to marked benign prostatic hyperplasia.
2. Compression of the peripheral zone secondary to central gland
enlargement. No convincing evidence of macroscopic peripheral zone
carcinoma.
3. Bladder outlet obstruction, with bladder distension and wall
irregularity.

## 2016-05-29 DIAGNOSIS — N400 Enlarged prostate without lower urinary tract symptoms: Secondary | ICD-10-CM | POA: Diagnosis not present

## 2016-05-29 DIAGNOSIS — N5201 Erectile dysfunction due to arterial insufficiency: Secondary | ICD-10-CM | POA: Diagnosis not present

## 2016-05-29 DIAGNOSIS — R972 Elevated prostate specific antigen [PSA]: Secondary | ICD-10-CM | POA: Diagnosis not present

## 2016-06-12 DIAGNOSIS — Z139 Encounter for screening, unspecified: Secondary | ICD-10-CM | POA: Diagnosis not present

## 2016-06-12 DIAGNOSIS — I10 Essential (primary) hypertension: Secondary | ICD-10-CM | POA: Diagnosis not present

## 2016-06-12 DIAGNOSIS — Z79899 Other long term (current) drug therapy: Secondary | ICD-10-CM | POA: Diagnosis not present

## 2016-06-12 DIAGNOSIS — Z0000 Encounter for general adult medical examination without abnormal findings: Secondary | ICD-10-CM | POA: Diagnosis not present

## 2016-06-12 DIAGNOSIS — Z8546 Personal history of malignant neoplasm of prostate: Secondary | ICD-10-CM | POA: Diagnosis not present

## 2016-06-12 DIAGNOSIS — H6122 Impacted cerumen, left ear: Secondary | ICD-10-CM | POA: Diagnosis not present

## 2016-06-12 DIAGNOSIS — E119 Type 2 diabetes mellitus without complications: Secondary | ICD-10-CM | POA: Diagnosis not present

## 2016-09-19 DIAGNOSIS — I10 Essential (primary) hypertension: Secondary | ICD-10-CM | POA: Diagnosis not present

## 2016-09-19 DIAGNOSIS — E784 Other hyperlipidemia: Secondary | ICD-10-CM | POA: Diagnosis not present

## 2016-09-19 DIAGNOSIS — Z7982 Long term (current) use of aspirin: Secondary | ICD-10-CM | POA: Diagnosis not present

## 2016-09-19 DIAGNOSIS — E119 Type 2 diabetes mellitus without complications: Secondary | ICD-10-CM | POA: Diagnosis not present

## 2016-09-30 DIAGNOSIS — I10 Essential (primary) hypertension: Secondary | ICD-10-CM | POA: Diagnosis not present

## 2016-12-09 DIAGNOSIS — N5201 Erectile dysfunction due to arterial insufficiency: Secondary | ICD-10-CM | POA: Diagnosis not present

## 2016-12-09 DIAGNOSIS — N400 Enlarged prostate without lower urinary tract symptoms: Secondary | ICD-10-CM | POA: Diagnosis not present

## 2016-12-09 DIAGNOSIS — R972 Elevated prostate specific antigen [PSA]: Secondary | ICD-10-CM | POA: Diagnosis not present

## 2017-02-21 DIAGNOSIS — E782 Mixed hyperlipidemia: Secondary | ICD-10-CM | POA: Diagnosis not present

## 2017-02-21 DIAGNOSIS — I10 Essential (primary) hypertension: Secondary | ICD-10-CM | POA: Diagnosis not present

## 2017-02-21 DIAGNOSIS — E119 Type 2 diabetes mellitus without complications: Secondary | ICD-10-CM | POA: Diagnosis not present

## 2017-05-28 DIAGNOSIS — N401 Enlarged prostate with lower urinary tract symptoms: Secondary | ICD-10-CM | POA: Diagnosis not present

## 2017-05-28 DIAGNOSIS — R972 Elevated prostate specific antigen [PSA]: Secondary | ICD-10-CM | POA: Diagnosis not present

## 2017-05-28 DIAGNOSIS — R350 Frequency of micturition: Secondary | ICD-10-CM | POA: Diagnosis not present

## 2017-05-28 DIAGNOSIS — N5201 Erectile dysfunction due to arterial insufficiency: Secondary | ICD-10-CM | POA: Diagnosis not present

## 2017-06-19 DIAGNOSIS — H6123 Impacted cerumen, bilateral: Secondary | ICD-10-CM | POA: Diagnosis not present

## 2017-06-19 DIAGNOSIS — Z0000 Encounter for general adult medical examination without abnormal findings: Secondary | ICD-10-CM | POA: Diagnosis not present

## 2017-06-19 DIAGNOSIS — I10 Essential (primary) hypertension: Secondary | ICD-10-CM | POA: Diagnosis not present

## 2017-06-19 DIAGNOSIS — E119 Type 2 diabetes mellitus without complications: Secondary | ICD-10-CM | POA: Diagnosis not present

## 2017-06-19 DIAGNOSIS — E782 Mixed hyperlipidemia: Secondary | ICD-10-CM | POA: Diagnosis not present

## 2017-09-19 DIAGNOSIS — I10 Essential (primary) hypertension: Secondary | ICD-10-CM | POA: Diagnosis not present

## 2017-09-19 DIAGNOSIS — E119 Type 2 diabetes mellitus without complications: Secondary | ICD-10-CM | POA: Diagnosis not present

## 2017-11-12 DIAGNOSIS — R972 Elevated prostate specific antigen [PSA]: Secondary | ICD-10-CM | POA: Diagnosis not present

## 2017-11-19 DIAGNOSIS — L723 Sebaceous cyst: Secondary | ICD-10-CM | POA: Diagnosis not present

## 2017-11-19 DIAGNOSIS — N400 Enlarged prostate without lower urinary tract symptoms: Secondary | ICD-10-CM | POA: Diagnosis not present

## 2017-11-19 DIAGNOSIS — R972 Elevated prostate specific antigen [PSA]: Secondary | ICD-10-CM | POA: Diagnosis not present

## 2017-12-04 ENCOUNTER — Other Ambulatory Visit: Payer: Self-pay | Admitting: Nurse Practitioner

## 2017-12-05 ENCOUNTER — Other Ambulatory Visit: Payer: Self-pay | Admitting: Nurse Practitioner

## 2017-12-15 ENCOUNTER — Other Ambulatory Visit: Payer: Self-pay | Admitting: Nurse Practitioner

## 2017-12-19 ENCOUNTER — Encounter: Payer: Self-pay | Admitting: Nurse Practitioner

## 2017-12-19 ENCOUNTER — Ambulatory Visit (INDEPENDENT_AMBULATORY_CARE_PROVIDER_SITE_OTHER): Payer: PPO | Admitting: Nurse Practitioner

## 2017-12-19 VITALS — BP 124/72 | HR 87 | Temp 98.4°F | Ht 69.5 in | Wt 165.4 lb

## 2017-12-19 DIAGNOSIS — I10 Essential (primary) hypertension: Secondary | ICD-10-CM | POA: Insufficient documentation

## 2017-12-19 DIAGNOSIS — E119 Type 2 diabetes mellitus without complications: Secondary | ICD-10-CM | POA: Insufficient documentation

## 2017-12-19 DIAGNOSIS — K219 Gastro-esophageal reflux disease without esophagitis: Secondary | ICD-10-CM

## 2017-12-19 LAB — CMP14 + ANION GAP
ALBUMIN: 4.8 g/dL (ref 3.5–4.8)
ALK PHOS: 69 IU/L (ref 39–117)
ALT: 25 IU/L (ref 0–44)
AST: 26 IU/L (ref 0–40)
Albumin/Globulin Ratio: 2.1 (ref 1.2–2.2)
Anion Gap: 14 mmol/L (ref 10.0–18.0)
BUN / CREAT RATIO: 15 (ref 10–24)
BUN: 16 mg/dL (ref 8–27)
Bilirubin Total: 0.6 mg/dL (ref 0.0–1.2)
CHLORIDE: 101 mmol/L (ref 96–106)
CO2: 23 mmol/L (ref 20–29)
CREATININE: 1.07 mg/dL (ref 0.76–1.27)
Calcium: 9.9 mg/dL (ref 8.6–10.2)
GFR calc Af Amer: 80 mL/min/{1.73_m2} (ref >59)
GFR calc non Af Amer: 69 mL/min/{1.73_m2} (ref >59)
Globulin, Total: 2.3 g/dL (ref 1.5–4.5)
Glucose: 121 mg/dL — ABNORMAL HIGH (ref 65–99)
Potassium: 4.3 mmol/L (ref 3.5–5.2)
SODIUM: 138 mmol/L (ref 134–144)
Total Protein: 7.1 g/dL (ref 6.0–8.5)

## 2017-12-19 LAB — HEMOGLOBIN A1C
Est. average glucose Bld gHb Est-mCnc: 148 mg/dL
HEMOGLOBIN A1C: 6.8 % — AB (ref 4.8–5.6)

## 2017-12-19 NOTE — Patient Instructions (Addendum)
Type 2 Diabetes Mellitus, Diagnosis, Adult Type 2 diabetes (type 2 diabetes mellitus) is a long-term (chronic) disease. It may be caused by one or both of these problems:  Your body does not make enough of a hormone called insulin.  Your body does not react in a normal way to insulin that it makes.  Insulin lets sugars (glucose) go into cells in the body. This gives you energy. If you have type 2 diabetes, sugars cannot get into cells. This causes high blood sugar (hyperglycemia). Your doctor will set treatment goals for you. Generally, you should have these blood sugar levels:  Before meals (preprandial): 80-130 mg/dL (4.4-7.2 mmol/L).  After meals (postprandial): below 180 mg/dL (10 mmol/L).  A1c (hemoglobin A1c) level: less than 7%.  Follow these instructions at home: Questions to Ask Your Doctor  You may want to ask these questions:  Do I need to meet with a diabetes educator?  Where can I find a support group for people with diabetes?  What equipment will I need to care for myself at home?  What diabetes medicines do I need? When should I take them?  How often do I need to check my blood sugar?  What number can I call if I have questions?  When is my next doctor's visit?  General instructions  Take over-the-counter and prescription medicines only as told by your doctor.  Keep all follow-up visits as told by your doctor. This is important. Contact a doctor if:  Your blood sugar is at or above 240 mg/dL (13.3 mmol/L) for 2 days in a row.  You have been sick or have had a fever for 2 days or more and you are not getting better.  You have any of these problems for more than 6 hours: ? You cannot eat or drink. ? You feel sick to your stomach (nauseous). ? You throw up (vomit). ? You have watery poop (diarrhea). Get help right away if:  Your blood sugar is lower than 54 mg/dL (3 mmol/L).  You get confused.  You have trouble: ? Thinking  clearly. ? Breathing.  You have moderate or large ketone levels in your pee (urine). This information is not intended to replace advice given to you by your health care provider. Make sure you discuss any questions you have with your health care provider. Document Released: 10/10/2007 Document Revised: 06/08/2015 Document Reviewed: 02/03/2015 Elsevier Interactive Patient Education  2018 [employer].   Back Exercises If you have pain in your back, do these exercises 2-3 times each day or as told by your doctor. When the pain goes away, do the exercises once each day, but repeat the steps more times for each exercise (do more repetitions). If you do not have pain in your back, do these exercises once each day or as told by your doctor. Exercises Single Knee to Chest  Do these steps 3-5 times in a row for each leg: 1. Lie on your back on a firm bed or the floor with your legs stretched out. 2. Bring one knee to your chest. 3. Hold your knee to your chest by grabbing your knee or thigh. 4. Pull on your knee until you feel a gentle stretch in your lower back. 5. Keep doing the stretch for 10-30 seconds. 6. Slowly let go of your leg and straighten it.  Pelvic Tilt  Do these steps 5-10 times in a row: 1. Lie on your back on a firm bed or the floor with your legs  stretched out. 2. Bend your knees so they point up to the ceiling. Your feet should be flat on the floor. 3. Tighten your lower belly (abdomen) muscles to press your lower back against the floor. This will make your tailbone point up to the ceiling instead of pointing down to your feet or the floor. 4. Stay in this position for 5-10 seconds while you gently tighten your muscles and breathe evenly.  Cat-Cow  Do these steps until your lower back bends more easily: 1. Get on your hands and knees on a firm surface. Keep your hands under your shoulders, and keep your knees under your hips. You may put padding under your knees. 2. Let  your head hang down, and make your tailbone point down to the floor so your lower back is round like the back of a cat. 3. Stay in this position for 5 seconds. 4. Slowly lift your head and make your tailbone point up to the ceiling so your back hangs low (sags) like the back of a cow. 5. Stay in this position for 5 seconds.  Press-Ups  Do these steps 5-10 times in a row: 1. Lie on your belly (face-down) on the floor. 2. Place your hands near your head, about shoulder-width apart. 3. While you keep your back relaxed and keep your hips on the floor, slowly straighten your arms to raise the top half of your body and lift your shoulders. Do not use your back muscles. To make yourself more comfortable, you may change where you place your hands. 4. Stay in this position for 5 seconds. 5. Slowly return to lying flat on the floor.  Bridges  Do these steps 10 times in a row: 1. Lie on your back on a firm surface. 2. Bend your knees so they point up to the ceiling. Your feet should be flat on the floor. 3. Tighten your butt muscles and lift your butt off of the floor until your waist is almost as high as your knees. If you do not feel the muscles working in your butt and the back of your thighs, slide your feet 1-2 inches farther away from your butt. 4. Stay in this position for 3-5 seconds. 5. Slowly lower your butt to the floor, and let your butt muscles relax.  If this exercise is too easy, try doing it with your arms crossed over your chest. Belly Crunches  Do these steps 5-10 times in a row: 1. Lie on your back on a firm bed or the floor with your legs stretched out. 2. Bend your knees so they point up to the ceiling. Your feet should be flat on the floor. 3. Cross your arms over your chest. 4. Tip your chin a little bit toward your chest but do not bend your neck. 5. Tighten your belly muscles and slowly raise your chest just enough to lift your shoulder blades a tiny bit off of the  floor. 6. Slowly lower your chest and your head to the floor.  Back Lifts Do these steps 5-10 times in a row: 1. Lie on your belly (face-down) with your arms at your sides, and rest your forehead on the floor. 2. Tighten the muscles in your legs and your butt. 3. Slowly lift your chest off of the floor while you keep your hips on the floor. Keep the back of your head in line with the curve in your back. Look at the floor while you do this. 4. Stay in this  position for 3-5 seconds. 5. Slowly lower your chest and your face to the floor.  Contact a doctor if:  Your back pain gets a lot worse when you do an exercise.  Your back pain does not lessen 2 hours after you exercise. If you have any of these problems, stop doing the exercises. Do not do them again unless your doctor says it is okay. Get help right away if:  You have sudden, very bad back pain. If this happens, stop doing the exercises. Do not do them again unless your doctor says it is okay. This information is not intended to replace advice given to you by your health care provider. Make sure you discuss any questions you have with your health care provider. Document Released: 02/02/2010 Document Revised: 06/08/2015 Document Reviewed: 02/24/2014 Elsevier Interactive Patient Education  2018 [place] TO HELP WITH YOUR BLOOD SUGARS

## 2017-12-19 NOTE — Progress Notes (Signed)
Subjective:     Patient ID: Ricky Shields , male    DOB: 1945/03/21 , 72 y.o.   MRN: [id number]   Chief Complaint  Patient presents with  . Hypertension  . Diabetes    HPI  Hypertension  This is a chronic problem. The current episode started more than 1 year ago. The problem is unchanged. The problem is controlled. Pertinent negatives include no anxiety, blurred vision, chest pain, headaches or palpitations. There are no associated agents to hypertension. Risk factors for coronary artery disease include diabetes mellitus. Past treatments include angiotensin blockers. There are no compliance problems.  There is no history of angina.  Diabetes  He presents for his follow-up diabetic visit. He has type 2 diabetes mellitus. Pertinent negatives for hypoglycemia include no dizziness or headaches. Pertinent negatives for diabetes include no blurred vision, no chest pain and no fatigue. Symptoms are stable. Risk factors for coronary artery disease include diabetes mellitus, hypertension and male sex. Current diabetic treatment includes oral agent (dual therapy). He is following a generally healthy (Herbalife shakes daily) diet. When asked about meal planning, he reported none. He participates in exercise daily. There is no change in his home blood glucose trend. An ACE inhibitor/angiotensin II receptor blocker is being taken. He does not see a podiatrist.Eye exam is not current (opthamology appt this month).  Gastroesophageal Reflux  He reports no abdominal pain, no chest pain or no choking. This is a chronic (had been taking Zantac - now taking nexium) problem. The current episode started more than 1 year ago. The problem has been unchanged. Pertinent negatives include no fatigue. He has tried a histamine-2 antagonist for the symptoms. The treatment provided moderate relief.     Past Medical History:  Diagnosis Date  . Back pain    intermittent-prior military injury  . BPH (benign prostatic  hypertrophy) with urinary retention   . Diabetes mellitus without complication ([place])   . GERD (gastroesophageal reflux disease)   . High cholesterol   . Hypertension      Family History  Problem Relation Age of Onset  . Stroke Mother   . Stroke Father      Current Outpatient Medications:  .  cholecalciferol (VITAMIN D) 1000 UNITS tablet, Take 1,000 Units by mouth daily., Disp: , Rfl:  .  ESOMEPRAZOLE MAGNESIUM PO, Take 20 mg by mouth daily at 12 noon., Disp: , Rfl:  .  metFORMIN (GLUCOPHAGE) 500 MG tablet, TAKE 2 TABLETS BY MOUTH TWICE DAILY WITH MORNING AND EVENING MEALS, Disp: 360 tablet, Rfl: 0 .  methocarbamol (ROBAXIN) 500 MG tablet, Take 1 tablet (500 mg total) by mouth 2 (two) times daily., Disp: 20 tablet, Rfl: 0 .  Multiple Vitamin (MULTIVITAMIN WITH MINERALS) TABS tablet, Take 1 tablet by mouth daily., Disp: , Rfl:  .  Olmesartan-amLODIPine-HCTZ 40-10-12.5 MG TABS, TAKE 1 TABLET BY MOUTH EVERY DAY, Disp: 90 tablet, Rfl: 0 .  ONE TOUCH ULTRA TEST test strip, , Disp: , Rfl: 0 .  simvastatin (ZOCOR) 40 MG tablet, TAKE 1 TABLET BY MOUTH EVERY EVENING, Disp: 90 tablet, Rfl: 0 .  tamsulosin (FLOMAX) 0.4 MG CAPS capsule, Take 0.4 mg by mouth daily., Disp: , Rfl: 5 .  valACYclovir (VALTREX) 500 MG tablet, , Disp: , Rfl: 0   Allergies  Allergen Reactions  . Penicillins Rash    Has patient had a PCN reaction causing immediate rash, facial/tongue/throat swelling, SOB or lightheadedness with hypotension: No Has patient had a PCN reaction causing severe rash involving  mucus membranes or skin necrosis: No Has patient had a PCN reaction that required hospitalization No Has patient had a PCN reaction occurring within the last 10 years: No If all of the above answers are "NO", then may proceed with Cephalosporin use.      Review of Systems  Constitutional: Negative for fatigue.  Eyes: Negative for blurred vision.  Respiratory: Negative.  Negative for choking.   Cardiovascular:  Negative.  Negative for chest pain, palpitations and leg swelling.  Gastrointestinal: Negative.  Negative for abdominal pain.  Genitourinary: Negative.   Musculoskeletal: Negative.   Skin: Negative.   Neurological: Negative for dizziness and headaches.     Today's Vitals   12/19/17 1059  BP: 124/72  Pulse: 87  Temp: 98.4 F (36.9 C)  TempSrc: Oral  SpO2: 98%  Weight: 165 lb 6.4 oz (75 kg)  Height: 5' 9.5" (1.765 m)  PainSc: 0-No pain   Body mass index is 24.08 kg/m.   Objective:  Physical Exam  Constitutional: He is oriented to person, place, and time. He appears well-developed and well-nourished.  Cardiovascular: Normal rate, regular rhythm and normal heart sounds.  Pulmonary/Chest: Effort normal and breath sounds normal.  Neurological: He is alert and oriented to person, place, and time.  Skin: Skin is warm and dry. Capillary refill takes less than 2 seconds.  Vitals reviewed.       Assessment And Plan:     1. Type 2 diabetes mellitus without complication, without long-term current use of insulin (HCC)  Chronic, controlled  Continue with current medications  2. Essential hypertension . B/P is controlled.  . CMP ordered to check renal function.  . The importance of regular exercise and dietary modification was stressed to the patient.   3. Gastroesophageal reflux disease without esophagitis  Intermittent  He is encourage to continue with Nexium     [name], FNP

## 2018-01-01 DIAGNOSIS — E119 Type 2 diabetes mellitus without complications: Secondary | ICD-10-CM | POA: Diagnosis not present

## 2018-01-01 DIAGNOSIS — H0234 Blepharochalasis left upper eyelid: Secondary | ICD-10-CM | POA: Diagnosis not present

## 2018-01-01 DIAGNOSIS — H35363 Drusen (degenerative) of macula, bilateral: Secondary | ICD-10-CM | POA: Diagnosis not present

## 2018-01-01 DIAGNOSIS — H0231 Blepharochalasis right upper eyelid: Secondary | ICD-10-CM | POA: Diagnosis not present

## 2018-01-19 ENCOUNTER — Encounter: Payer: Self-pay | Admitting: Nurse Practitioner

## 2018-01-19 ENCOUNTER — Ambulatory Visit (INDEPENDENT_AMBULATORY_CARE_PROVIDER_SITE_OTHER): Payer: PPO | Admitting: Nurse Practitioner

## 2018-01-19 VITALS — BP 128/78 | HR 87 | Temp 98.1°F | Wt 166.4 lb

## 2018-01-19 DIAGNOSIS — M545 Low back pain, unspecified: Secondary | ICD-10-CM

## 2018-01-19 DIAGNOSIS — R0981 Nasal congestion: Secondary | ICD-10-CM | POA: Diagnosis not present

## 2018-01-19 MED ORDER — TRIAMCINOLONE ACETONIDE 40 MG/ML IJ SUSP
40.0000 mg | Freq: Once | INTRAMUSCULAR | Status: AC
  Administered 2018-01-19: 40 mg via INTRAMUSCULAR

## 2018-01-19 MED ORDER — MOMETASONE FUROATE 50 MCG/ACT NA SUSP: 2.0000 | Freq: Every day | NASAL | 2 refills | Status: DC

## 2018-01-19 MED ORDER — KETOROLAC TROMETHAMINE 60 MG/2ML IM SOLN
60.0000 mg | Freq: Once | INTRAMUSCULAR | Status: AC
  Administered 2018-01-19: 60 mg via INTRAMUSCULAR

## 2018-01-19 NOTE — Progress Notes (Signed)
Subjective:     Patient ID: Ricky Shields , male    DOB: 28-Oct-1945 , 73 y.o.   MRN: [id number]   Chief Complaint  Patient presents with  . Back Pain    HPI  Back Pain  This is a recurrent (Occurred after flying during Christmas break) problem. The current episode started 1 to 4 weeks ago. The problem occurs constantly. The problem has been gradually worsening since onset. The pain is present in the lumbar spine. The pain is at a severity of 7/10 (Has been as high as 9-10 pain scale). The patient is experiencing no pain. The pain is the same all the time. The symptoms are aggravated by bending, lying down, sitting, standing and twisting. Pertinent negatives include no abdominal pain, headaches, numbness or paresthesias. He has tried heat and NSAIDs for the symptoms. The treatment provided mild relief.     Past Medical History:  Diagnosis Date  . Back pain    intermittent-prior military injury  . BPH (benign prostatic hypertrophy) with urinary retention   . Diabetes mellitus without complication ([place])   . GERD (gastroesophageal reflux disease)   . High cholesterol   . Hypertension      Family History  Problem Relation Age of Onset  . Stroke Mother   . Stroke Father      Current Outpatient Medications:  .  cholecalciferol (VITAMIN D) 1000 UNITS tablet, Take 1,000 Units by mouth daily., Disp: , Rfl:  .  ESOMEPRAZOLE MAGNESIUM PO, Take 20 mg by mouth daily at 12 noon., Disp: , Rfl:  .  metFORMIN (GLUCOPHAGE) 500 MG tablet, TAKE 2 TABLETS BY MOUTH TWICE DAILY WITH MORNING AND EVENING MEALS, Disp: 360 tablet, Rfl: 0 .  Multiple Vitamin (MULTIVITAMIN WITH MINERALS) TABS tablet, Take 1 tablet by mouth daily., Disp: , Rfl:  .  Olmesartan-amLODIPine-HCTZ 40-10-12.5 MG TABS, TAKE 1 TABLET BY MOUTH EVERY DAY, Disp: 90 tablet, Rfl: 0 .  ONE TOUCH ULTRA TEST test strip, , Disp: , Rfl: 0 .  simvastatin (ZOCOR) 40 MG tablet, TAKE 1 TABLET BY MOUTH EVERY EVENING, Disp: 90 tablet, Rfl: 0 .   tamsulosin (FLOMAX) 0.4 MG CAPS capsule, Take 0.4 mg by mouth daily., Disp: , Rfl: 5 .  valACYclovir (VALTREX) 500 MG tablet, , Disp: , Rfl: 0   Allergies  Allergen Reactions  . Penicillins Rash    Has patient had a PCN reaction causing immediate rash, facial/tongue/throat swelling, SOB or lightheadedness with hypotension: No Has patient had a PCN reaction causing severe rash involving mucus membranes or skin necrosis: No Has patient had a PCN reaction that required hospitalization No Has patient had a PCN reaction occurring within the last 10 years: No If all of the above answers are "NO", then may proceed with Cephalosporin use.      Review of Systems  Constitutional: Negative for fatigue.  HENT: Positive for congestion.   Eyes: Negative.   Respiratory: Negative for cough.   Cardiovascular: Negative.   Gastrointestinal: Negative for abdominal pain.  Musculoskeletal: Positive for back pain.  Neurological: Negative for numbness, headaches and paresthesias.     Today's Vitals   01/19/18 1134  BP: 128/78  Pulse: 87  Temp: 98.1 F (36.7 C)  TempSrc: Oral  SpO2: 97%  Weight: 166 lb 6.4 oz (75.5 kg)  PainSc: 8   PainLoc: Back   Body mass index is 24.22 kg/m.   Objective:  Physical Exam Constitutional:      Appearance: Normal appearance.  Eyes:  Pupils: Pupils are equal, round, and reactive to light.  Cardiovascular:     Rate and Rhythm: Normal rate and regular rhythm.     Pulses: Normal pulses.     Heart sounds: Normal heart sounds.  Pulmonary:     Effort: Pulmonary effort is normal. No respiratory distress.     Breath sounds: Normal breath sounds. No wheezing.  Skin:    General: Skin is warm and dry.  Neurological:     General: No focal deficit present.     Mental Status: He is alert.         Assessment And Plan:     1. Acute right-sided low back pain without sciatica  Negative straight leg raise  Will treat with Toradol 60 mg and Kenalog 40  mg  Encouraged to continue with ibuprofen as needed - ketorolac (TORADOL) injection 60 mg - triamcinolone acetonide (KENALOG-40) injection 40 mg  2. Nasal congestion  Norel AD given and advised to take no more than 3 days in a row  Use nasonex as directed.  - mometasone (NASONEX) 50 MCG/ACT nasal spray; Place 2 sprays into the nose daily.  Dispense: 17 g; Refill: 2       [name], FNP

## 2018-03-05 ENCOUNTER — Other Ambulatory Visit: Payer: Self-pay | Admitting: Nurse Practitioner

## 2018-03-16 ENCOUNTER — Other Ambulatory Visit: Payer: Self-pay | Admitting: Nurse Practitioner

## 2018-03-20 ENCOUNTER — Encounter: Payer: Self-pay | Admitting: Nurse Practitioner

## 2018-03-20 ENCOUNTER — Ambulatory Visit (INDEPENDENT_AMBULATORY_CARE_PROVIDER_SITE_OTHER): Payer: PPO | Admitting: Nurse Practitioner

## 2018-03-20 VITALS — BP 120/70 | HR 73 | Ht 69.5 in | Wt 172.0 lb

## 2018-03-20 DIAGNOSIS — E119 Type 2 diabetes mellitus without complications: Secondary | ICD-10-CM

## 2018-03-20 DIAGNOSIS — R202 Paresthesia of skin: Secondary | ICD-10-CM

## 2018-03-20 DIAGNOSIS — I10 Essential (primary) hypertension: Secondary | ICD-10-CM

## 2018-03-20 HISTORY — DX: Paresthesia of skin: R20.2

## 2018-03-20 NOTE — Progress Notes (Signed)
Subjective:     Patient ID: Ricky Shields , male    DOB: [birth year] , 73 y.o.   MRN: [id number]   Chief Complaint  Patient presents with  . Diabetes    dm check , no other     HPI  When he is walking on the treadmill he will have tingling to his feet.    Diabetes  He presents for his follow-up diabetic visit. He has type 2 diabetes mellitus. There are no hypoglycemic associated symptoms. Pertinent negatives for hypoglycemia include no dizziness or headaches. There are no diabetic associated symptoms. Pertinent negatives for diabetes include no blurred vision, no chest pain, no fatigue, no polydipsia, no polyphagia and no polyuria. There are no hypoglycemic complications. Symptoms are stable. There are no diabetic complications. There are no known risk factors for coronary artery disease. Current diabetic treatment includes oral agent (dual therapy). His weight is stable. He is following a generally healthy ([place]bal[place] shakes daily) diet. When asked about meal planning, he reported none. He participates in exercise daily. There is no change in his home blood glucose trend. (138-156 blood sugars.) An ACE inhibitor/angiotensin II receptor blocker is being taken. He does not see a podiatrist.Eye exam is not current (opthamology appt this month).  Hypertension  This is a chronic problem. The current episode started more than 1 year ago. The problem is unchanged. The problem is controlled. Pertinent negatives include no anxiety, blurred vision, chest pain, headaches or palpitations. There are no associated agents to hypertension. Risk factors for coronary artery disease include diabetes mellitus. Past treatments include angiotensin blockers. There are no compliance problems.  There is no history of angina.     Past Medical History:  Diagnosis Date  . Back pain    intermittent-prior military injury  . BPH (benign prostatic hypertrophy) with urinary retention   . Diabetes mellitus without  complication ([place])   . GERD (gastroesophageal reflux disease)   . High cholesterol   . Hypertension      Family History  Problem Relation Age of Onset  . Stroke Mother   . Stroke Father      Current Outpatient Medications:  .  cholecalciferol (VITAMIN D) 1000 UNITS tablet, Take 1,000 Units by mouth daily., Disp: , Rfl:  .  metFORMIN (GLUCOPHAGE) 500 MG tablet, TAKE 2 TABLETS BY MOUTH TWICE DAILY WITH MORNING AND EVENING MEALS, Disp: 360 tablet, Rfl: 0 .  mometasone (NASONEX) 50 MCG/ACT nasal spray, Place 2 sprays into the nose daily., Disp: 17 g, Rfl: 2 .  Multiple Vitamin (MULTIVITAMIN WITH MINERALS) TABS tablet, Take 1 tablet by mouth daily., Disp: , Rfl:  .  Olmesartan-amLODIPine-HCTZ 40-10-12.5 MG TABS, TAKE 1 TABLET BY MOUTH EVERY DAY, Disp: 90 tablet, Rfl: 0 .  ONE TOUCH ULTRA TEST test strip, , Disp: , Rfl: 0 .  simvastatin (ZOCOR) 40 MG tablet, TAKE 1 TABLET BY MOUTH EVERY EVENING, Disp: 90 tablet, Rfl: 0 .  tamsulosin (FLOMAX) 0.4 MG CAPS capsule, Take 0.4 mg by mouth daily., Disp: , Rfl: 5 .  valACYclovir (VALTREX) 500 MG tablet, , Disp: , Rfl: 0   Allergies  Allergen Reactions  . Penicillins Rash    Has patient had a PCN reaction causing immediate rash, facial/tongue/throat swelling, SOB or lightheadedness with hypotension: No Has patient had a PCN reaction causing severe rash involving mucus membranes or skin necrosis: No Has patient had a PCN reaction that required hospitalization No Has patient had a PCN reaction occurring within the last 10 years:  No If all of the above answers are "NO", then may proceed with Cephalosporin use.      Review of Systems  Constitutional: Negative for fatigue.  Eyes: Negative for blurred vision and photophobia.  Respiratory: Negative.  Negative for cough and choking.   Cardiovascular: Negative.  Negative for chest pain, palpitations and leg swelling.  Gastrointestinal: Negative.  Negative for abdominal pain.  Endocrine: Negative for  polydipsia, polyphagia and polyuria.  Genitourinary: Negative.   Musculoskeletal: Negative.   Skin: Negative.   Neurological: Negative for dizziness and headaches.     Today's Vitals   03/20/18 1032  BP: 120/70  Pulse: 73  SpO2: 95%  Weight: 172 lb (78 kg)  Height: 5' 9.5" (1.765 m)   Body mass index is 25.04 kg/m.   Objective:  Physical Exam Vitals signs reviewed.  Constitutional:      Appearance: He is well-developed.  Cardiovascular:     Rate and Rhythm: Normal rate and regular rhythm.     Heart sounds: Normal heart sounds.  Pulmonary:     Effort: Pulmonary effort is normal.     Breath sounds: Normal breath sounds.  Skin:    General: Skin is warm and dry.     Capillary Refill: Capillary refill takes less than 2 seconds.  Neurological:     General: No focal deficit present.     Mental Status: He is alert and oriented to person, place, and time.  Psychiatric:        Mood and Affect: Mood normal.        Behavior: Behavior normal.        Thought Content: Thought content normal.        Judgment: Judgment normal.         Assessment And Plan:  1. Essential hypertension . B/P is controlled.  . CMP ordered to check renal function.  . The importance of regular exercise and dietary modification was stressed to the patient.  . Stressed importance of losing ten percent of her body weight to help with B/P control.  . The weight loss would help with decreasing cardiac and cancer risk as well.  - Hemoglobin A1c - BMP8+eGFR  2. Type 2 diabetes mellitus without complication, without long-term current use of insulin (HCC)  Chronic, controlled  Continue with current medications  Encouraged to limit intake of sugary foods and drinks  Encouraged to increase physical activity to 150 minutes per week  - Hemoglobin A1c - BMP8+eGFR  3. Tingling of skin  Encouraged to take vitamin b12 supplement and increase his beet intake.   If persist will check additional  labs   [name], FNP

## 2018-03-21 LAB — HEMOGLOBIN A1C
ESTIMATED AVERAGE GLUCOSE: 151 mg/dL
Hgb A1c MFr Bld: 6.9 % — ABNORMAL HIGH (ref 4.8–5.6)

## 2018-03-21 LAB — BMP8+EGFR
BUN/Creatinine Ratio: 10 (ref 10–24)
BUN: 11 mg/dL (ref 8–27)
CALCIUM: 9.9 mg/dL (ref 8.6–10.2)
CHLORIDE: 101 mmol/L (ref 96–106)
CO2: 21 mmol/L (ref 20–29)
CREATININE: 1.13 mg/dL (ref 0.76–1.27)
GFR calc non Af Amer: 65 mL/min/{1.73_m2} (ref >59)
GFR, EST AFRICAN AMERICAN: 75 mL/min/{1.73_m2} (ref >59)
Glucose: 118 mg/dL — ABNORMAL HIGH (ref 65–99)
POTASSIUM: 4.1 mmol/L (ref 3.5–5.2)
Sodium: 138 mmol/L (ref 134–144)

## 2018-04-06 ENCOUNTER — Other Ambulatory Visit: Payer: Self-pay | Admitting: Nurse Practitioner

## 2018-04-07 ENCOUNTER — Encounter: Payer: Self-pay | Admitting: Nurse Practitioner

## 2018-06-02 ENCOUNTER — Other Ambulatory Visit: Payer: Self-pay | Admitting: Nurse Practitioner

## 2018-06-13 ENCOUNTER — Other Ambulatory Visit: Payer: Self-pay | Admitting: Nurse Practitioner

## 2018-06-25 ENCOUNTER — Ambulatory Visit (INDEPENDENT_AMBULATORY_CARE_PROVIDER_SITE_OTHER): Payer: PPO

## 2018-06-25 ENCOUNTER — Other Ambulatory Visit: Payer: Self-pay

## 2018-06-25 ENCOUNTER — Ambulatory Visit (INDEPENDENT_AMBULATORY_CARE_PROVIDER_SITE_OTHER): Payer: PPO | Admitting: Nurse Practitioner

## 2018-06-25 VITALS — BP 124/70 | HR 90 | Temp 98.3°F | Ht 68.0 in | Wt 161.4 lb

## 2018-06-25 DIAGNOSIS — I10 Essential (primary) hypertension: Secondary | ICD-10-CM | POA: Diagnosis not present

## 2018-06-25 DIAGNOSIS — E119 Type 2 diabetes mellitus without complications: Secondary | ICD-10-CM | POA: Diagnosis not present

## 2018-06-25 DIAGNOSIS — Z0000 Encounter for general adult medical examination without abnormal findings: Secondary | ICD-10-CM

## 2018-06-25 DIAGNOSIS — Z125 Encounter for screening for malignant neoplasm of prostate: Secondary | ICD-10-CM | POA: Diagnosis not present

## 2018-06-25 LAB — POCT URINALYSIS DIPSTICK
Bilirubin, UA: NEGATIVE
Blood, UA: NEGATIVE
Glucose, UA: NEGATIVE
Leukocytes, UA: NEGATIVE
Nitrite, UA: NEGATIVE
Protein, UA: POSITIVE — AB
Spec Grav, UA: 1.02 (ref 1.010–1.025)
Urobilinogen, UA: 0.2 E.U./dL
pH, UA: 6 (ref 5.0–8.0)

## 2018-06-25 LAB — POCT UA - MICROALBUMIN
Creatinine, POC: 200 mg/dL
Microalbumin Ur, POC: 80 mg/L

## 2018-06-25 NOTE — Patient Instructions (Signed)
Ricky Shields , Thank you for taking time to come for your Medicare Wellness Visit. I appreciate your ongoing commitment to your health goals. Please review the following plan we discussed and let me know if I can assist you in the future.   Screening recommendations/referrals: Colonoscopy: 02/2014 Recommended yearly ophthalmology/optometry visit for glaucoma screening and checkup Recommended yearly dental visit for hygiene and checkup  Vaccinations: Influenza vaccine: 10/2017 Pneumococcal vaccine: 01/2014 Tdap vaccine: 10/2012 Shingles vaccine: discussed    Advanced directives: Advance directive discussed with you today. Even though you declined this today please call our office should you change your mind and we can give you the proper paperwork for you to fill out.   Conditions/risks identified: none  Next appointment: 06/25/2018  Preventive Care 72 Years and Older, Male Preventive care refers to lifestyle choices and visits with your health care provider that can promote health and wellness. What does preventive care include?  A yearly physical exam. This is also called an annual well check.  Dental exams once or twice a year.  Routine eye exams. Ask your health care provider how often you should have your eyes checked.  Personal lifestyle choices, including:  Daily care of your teeth and gums.  Regular physical activity.  Eating a healthy diet.  Avoiding tobacco and drug use.  Limiting alcohol use.  Practicing safe sex.  Taking low doses of aspirin every day.  Taking vitamin and mineral supplements as recommended by your health care provider. What happens during an annual well check? The services and screenings done by your health care provider during your annual well check will depend on your age, overall health, lifestyle risk factors, and family history of disease. Counseling  Your health care provider may ask you questions about your:  Alcohol use.  Tobacco  use.  Drug use.  Emotional well-being.  Home and relationship well-being.  Sexual activity.  Eating habits.  History of falls.  Memory and ability to understand (cognition).  Work and work Statistician. Screening  You may have the following tests or measurements:  Height, weight, and BMI.  Blood pressure.  Lipid and cholesterol levels. These may be checked every 5 years, or more frequently if you are over 64 years old.  Skin check.  Lung cancer screening. You may have this screening every year starting at age 25 if you have a 30-pack-year history of smoking and currently smoke or have quit within the past 15 years.  Fecal occult blood test (FOBT) of the stool. You may have this test every year starting at age 16.  Flexible sigmoidoscopy or colonoscopy. You may have a sigmoidoscopy every 5 years or a colonoscopy every 10 years starting at age 60.  Prostate cancer screening. Recommendations will vary depending on your family history and other risks.  Hepatitis C blood test.  Hepatitis B blood test.  Sexually transmitted disease (STD) testing.  Diabetes screening. This is done by checking your blood sugar (glucose) after you have not eaten for a while (fasting). You may have this done every 1-3 years.  Abdominal aortic aneurysm (AAA) screening. You may need this if you are a current or former smoker.  Osteoporosis. You may be screened starting at age 82 if you are at high risk. Talk with your health care provider about your test results, treatment options, and if necessary, the need for more tests. Vaccines  Your health care provider may recommend certain vaccines, such as:  Influenza vaccine. This is recommended every year.  Tetanus,  diphtheria, and acellular pertussis (Tdap, Td) vaccine. You may need a Td booster every 10 years.  Zoster vaccine. You may need this after age 37.  Pneumococcal 13-valent conjugate (PCV13) vaccine. One dose is recommended after age  61.  Pneumococcal polysaccharide (PPSV23) vaccine. One dose is recommended after age 79. Talk to your health care provider about which screenings and vaccines you need and how often you need them. This information is not intended to replace advice given to you by your health care provider. Make sure you discuss any questions you have with your health care provider. Document Released: 01/27/2015 Document Revised: 09/20/2015 Document Reviewed: 11/01/2014 Elsevier Interactive Patient Education  2017 [place] Prevention in the Home Falls can cause injuries. They can happen to people of all ages. There are many things you can do to make your home safe and to help prevent falls. What can I do on the outside of my home?  Regularly fix the edges of walkways and driveways and fix any cracks.  Remove anything that might make you trip as you walk through a door, such as a raised step or threshold.  Trim any bushes or trees on the path to your home.  Use bright outdoor lighting.  Clear any walking paths of anything that might make someone trip, such as rocks or tools.  Regularly check to see if handrails are loose or broken. Make sure that both sides of any steps have handrails.  Any raised decks and porches should have guardrails on the edges.  Have any leaves, snow, or ice cleared regularly.  Use sand or salt on walking paths during winter.  Clean up any spills in your garage right away. This includes oil or grease spills. What can I do in the bathroom?  Use night lights.  Install grab bars by the toilet and in the tub and shower. Do not use towel bars as grab bars.  Use non-skid mats or decals in the tub or shower.  If you need to sit down in the shower, use a plastic, non-slip stool.  Keep the floor dry. Clean up any water that spills on the floor as soon as it happens.  Remove soap buildup in the tub or shower regularly.  Attach bath mats securely with double-sided  non-slip rug tape.  Do not have throw rugs and other things on the floor that can make you trip. What can I do in the bedroom?  Use night lights.  Make sure that you have a light by your bed that is easy to reach.  Do not use any sheets or blankets that are too big for your bed. They should not hang down onto the floor.  Have a firm chair that has side arms. You can use this for support while you get dressed.  Do not have throw rugs and other things on the floor that can make you trip. What can I do in the kitchen?  Clean up any spills right away.  Avoid walking on wet floors.  Keep items that you use a lot in easy-to-reach places.  If you need to reach something above you, use a strong step stool that has a grab bar.  Keep electrical cords out of the way.  Do not use floor polish or wax that makes floors slippery. If you must use wax, use non-skid floor wax.  Do not have throw rugs and other things on the floor that can make you trip. What can I do with  my stairs?  Do not leave any items on the stairs.  Make sure that there are handrails on both sides of the stairs and use them. Fix handrails that are broken or loose. Make sure that handrails are as long as the stairways.  Check any carpeting to make sure that it is firmly attached to the stairs. Fix any carpet that is loose or worn.  Avoid having throw rugs at the top or bottom of the stairs. If you do have throw rugs, attach them to the floor with carpet tape.  Make sure that you have a light switch at the top of the stairs and the bottom of the stairs. If you do not have them, ask someone to add them for you. What else can I do to help prevent falls?  Wear shoes that:  Do not have high heels.  Have rubber bottoms.  Are comfortable and fit you well.  Are closed at the toe. Do not wear sandals.  If you use a stepladder:  Make sure that it is fully opened. Do not climb a closed stepladder.  Make sure that both  sides of the stepladder are locked into place.  Ask someone to hold it for you, if possible.  Clearly mark and make sure that you can see:  Any grab bars or handrails.  First and last steps.  Where the edge of each step is.  Use tools that help you move around (mobility aids) if they are needed. These include:  Canes.  Walkers.  Scooters.  Crutches.  Turn on the lights when you go into a dark area. Replace any light bulbs as soon as they burn out.  Set up your furniture so you have a clear path. Avoid moving your furniture around.  If any of your floors are uneven, fix them.  If there are any pets around you, be aware of where they are.  Review your medicines with your doctor. Some medicines can make you feel dizzy. This can increase your chance of falling. Ask your doctor what other things that you can do to help prevent falls. This information is not intended to replace advice given to you by your health care provider. Make sure you discuss any questions you have with your health care provider. Document Released: 10/27/2008 Document Revised: 06/08/2015 Document Reviewed: 02/04/2014 Elsevier Interactive Patient Education  2017 [employer].

## 2018-06-25 NOTE — Progress Notes (Signed)
Subjective:     Patient ID: Ricky Shields , male    DOB: Jun 08, 1945 , 73 y.o.   MRN: [id number]   Chief Complaint  Patient presents with  . Hypertension   Men's preventive visit. Patient Health Questionnaire (PHQ-2) is    Clinical Support from 06/25/2018 in [facility]  PHQ-2 Total Score  0     Patient is on a Regular diet mostly healthy. Marital status: Married. Relevant history for alcohol use is:  Social History   Substance and Sexual Activity  Alcohol Use No  . Alcohol/week: 0.0 standard drinks  . Relevant history for tobacco use is:  Social History   Tobacco Use  Smoking Status Never Smoker  Smokeless Tobacco Never Used  . HPI  Hypertension This is a chronic problem. The current episode started more than 1 year ago. The problem is controlled. Pertinent negatives include no anxiety, blurred vision, chest pain, headaches or palpitations. There are no associated agents to hypertension. Risk factors for coronary artery disease include sedentary lifestyle and male gender. Past treatments include angiotensin blockers and diuretics. The current treatment provides no improvement. There are no compliance problems.  There is no history of angina.  Diabetes He presents for his follow-up diabetic visit. He has type 2 diabetes mellitus. His disease course has been stable. There are no hypoglycemic associated symptoms. Pertinent negatives for hypoglycemia include no dizziness or headaches. Pertinent negatives for diabetes include no blurred vision, no chest pain, no polydipsia, no polyphagia and no polyuria. There are no hypoglycemic complications. Symptoms are stable. There are no diabetic complications. Risk factors for coronary artery disease include male sex and sedentary lifestyle. Current diabetic treatment includes oral agent (monotherapy). He is compliant with treatment all of the time. His weight is stable. He is following a diabetic diet. When asked about meal  planning, he reported none. He has not had a previous visit with a dietitian. He participates in exercise intermittently. An ACE inhibitor/angiotensin II receptor blocker is being taken.     Past Medical History:  Diagnosis Date  . Back pain    intermittent-prior military injury  . BPH (benign prostatic hypertrophy) with urinary retention   . Diabetes mellitus without complication ([place])   . GERD (gastroesophageal reflux disease)   . High cholesterol   . Hypertension      Family History  Problem Relation Age of Onset  . Stroke Mother   . Stroke Father      Current Outpatient Medications:  .  cholecalciferol (VITAMIN D) 1000 UNITS tablet, Take 1,000 Units by mouth daily., Disp: , Rfl:  .  metFORMIN (GLUCOPHAGE) 500 MG tablet, TAKE 2 TABLETS BY MOUTH TWICE DAILY WITH THE MORNING AND EVENING MEAL, Disp: 360 tablet, Rfl: 0 .  mometasone (NASONEX) 50 MCG/ACT nasal spray, Place 2 sprays into the nose daily., Disp: 17 g, Rfl: 2 .  Multiple Vitamin (MULTIVITAMIN WITH MINERALS) TABS tablet, Take 1 tablet by mouth daily., Disp: , Rfl:  .  Olmesartan-amLODIPine-HCTZ 40-10-12.5 MG TABS, TAKE 1 TABLET BY MOUTH DAILY, Disp: 90 tablet, Rfl: 0 .  ONE TOUCH ULTRA TEST test strip, , Disp: , Rfl: 0 .  simvastatin (ZOCOR) 40 MG tablet, TAKE 1 TABLET BY MOUTH EVERY EVENING, Disp: 90 tablet, Rfl: 0 .  tamsulosin (FLOMAX) 0.4 MG CAPS capsule, Take 0.4 mg by mouth daily., Disp: , Rfl: 5 .  valACYclovir (VALTREX) 500 MG tablet, , Disp: , Rfl: 0   Allergies  Allergen Reactions  . Penicillins Rash  Has patient had a PCN reaction causing immediate rash, facial/tongue/throat swelling, SOB or lightheadedness with hypotension: No Has patient had a PCN reaction causing severe rash involving mucus membranes or skin necrosis: No Has patient had a PCN reaction that required hospitalization No Has patient had a PCN reaction occurring within the last 10 years: No If all of the above answers are "NO", then may  proceed with Cephalosporin use.      Review of Systems  Constitutional: Negative.   Eyes: Negative.  Negative for blurred vision.  Respiratory: Negative.   Cardiovascular: Negative.  Negative for chest pain, palpitations and leg swelling.  Endocrine: Negative for polydipsia, polyphagia and polyuria.  Musculoskeletal: Negative.   Neurological: Negative for dizziness and headaches.  Hematological: Negative.   Psychiatric/Behavioral: Negative.      Today's Vitals   06/25/18 1102  BP: 124/70  Pulse: 90  Temp: 98.3 F (36.8 C)  TempSrc: Oral  Weight: 161 lb 6.4 oz (73.2 kg)  Height: 5\' 8"  (1.727 m)  PainSc: 0-No pain   Body mass index is 24.54 kg/m.   Objective:  Physical Exam Vitals signs reviewed.  Constitutional:      Appearance: Normal appearance.  HENT:     Head: Normocephalic and atraumatic.     Right Ear: Tympanic membrane, ear canal and external ear normal. There is no impacted cerumen.     Left Ear: Tympanic membrane, ear canal and external ear normal. There is no impacted cerumen.  Neck:     Musculoskeletal: Normal range of motion and neck supple.  Cardiovascular:     Rate and Rhythm: Normal rate and regular rhythm.     Pulses: Normal pulses.     Heart sounds: Normal heart sounds. No murmur.  Pulmonary:     Effort: Pulmonary effort is normal. No respiratory distress.     Breath sounds: Normal breath sounds.  Abdominal:     General: Abdomen is flat. Bowel sounds are normal. There is no distension.     Palpations: Abdomen is soft.  Musculoskeletal: Normal range of motion.  Skin:    General: Skin is warm.     Capillary Refill: Capillary refill takes less than 2 seconds.  Neurological:     General: No focal deficit present.     Mental Status: He is alert and oriented to person, place, and time.  Psychiatric:        Mood and Affect: Mood normal.        Behavior: Behavior normal.        Thought Content: Thought content normal.        Judgment: Judgment  normal.         Assessment And Plan:     1. Essential hypertension . B/P is well controlled.  . CMP ordered to check renal function.  . The importance of regular exercise and dietary modification was stressed to the patient. .  - POCT Urinalysis Dipstick (81002) - POCT UA - Microalbumin - EKG 12-Lead - CBC no Diff - CMP14 + Anion Gap  2. Type 2 diabetes mellitus without complication, without long-term current use of insulin (HCC)  Chronic, controlled  Continue with current medications  Encouraged to limit intake of sugary foods and drinks  Encouraged to continue with physical activity to 150 minutes per week  Diabetic foot exam done with normal sensation - Hemoglobin A1c - Lipid Profile  3. Encounter for prostate cancer screening  Will check PSA level - PSA  4. Health maintenance examination . Behavior modifications  discussed and diet history reviewed.   . Pt will continue to exercise regularly and modify diet with low GI, plant based foods and decrease intake of processed foods.  . Recommend intake of daily multivitamin, Vitamin D  . Recommend  for preventive screenings, as well as recommend immunizations that include TDAP       [name], FNP    THE PATIENT IS ENCOURAGED TO PRACTICE SOCIAL DISTANCING DUE TO THE COVID-19 PANDEMIC.

## 2018-06-25 NOTE — Progress Notes (Signed)
Subjective:   Ricky Shields is a 73 y.o. male who presents for Medicare Annual/Subsequent preventive examination.  Review of Systems:  n/a Cardiac Risk Factors include: male gender;diabetes mellitus;hypertension     Objective:    Vitals: BP 124/70 (BP Location: Left Arm, Patient Position: Sitting, Cuff Size: Normal)   Pulse 90   Temp 98.3 F (36.8 C) (Oral)   Ht 5\' 8"  (1.727 m)   Wt 161 lb 6.4 oz (73.2 kg)   SpO2 97%   BMI 24.54 kg/m   Body mass index is 24.54 kg/m.  Advanced Directives 06/25/2018 01/27/2016 10/21/2014  Does Patient Have a Medical Advance Directive? No No No  Would patient like information on creating a medical advance directive? No - Patient declined - No - patient declined information    Tobacco Social History   Tobacco Use  Smoking Status Never Smoker  Smokeless Tobacco Never Used     Counseling given: Not Answered   Clinical Intake:  Pre-visit preparation completed: Yes  Pain : No/denies pain Pain Score: 0-No pain     Nutritional Status: BMI of 19-24  Normal Nutritional Risks: None Diabetes: Yes CBG done?: No Did pt. bring in CBG monitor from home?: No  How often do you need to have someone help you when you read instructions, pamphlets, or other written materials from your doctor or pharmacy?: 1 - Never What is the last grade level you completed in school?: 12th grade  Interpreter Needed?: No  Information entered by :: NAllen LPN  Past Medical History:  Diagnosis Date  . Back pain    intermittent-prior military injury  . BPH (benign prostatic hypertrophy) with urinary retention   . Diabetes mellitus without complication ([place])   . GERD (gastroesophageal reflux disease)   . High cholesterol   . Hypertension    Past Surgical History:  Procedure Laterality Date  . COLONOSCOPY    . GREEN LIGHT LASER TURP (TRANSURETHRAL RESECTION OF PROSTATE N/A 10/21/2014   Procedure: GREEN LIGHT LASER TURP (TRANSURETHRAL RESECTION OF PROSTATE;   Surgeon: Festus Aloe, MD;  Location: [facility];  Service: Urology;  Laterality: N/A;   Family History  Problem Relation Age of Onset  . Stroke Mother   . Stroke Father    Social History   Socioeconomic History  . Marital status: Married    Spouse name: Not on file  . Number of children: Not on file  . Years of education: Not on file  . Highest education level: Not on file  Occupational History  . Occupation: retired  Scientific laboratory technician  . Financial resource strain: Not hard at all  . Food insecurity    Worry: Never true    Inability: Never true  . Transportation needs    Medical: No    Non-medical: No  Tobacco Use  . Smoking status: Never Smoker  . Smokeless tobacco: Never Used  Substance and Sexual Activity  . Alcohol use: No    Alcohol/week: 0.0 standard drinks  . Drug use: No  . Sexual activity: Yes  Lifestyle  . Physical activity    Days per week: 3 days    Minutes per session: 40 min  . Stress: Not at all  Relationships  . Social Herbalist on phone: Not on file    Gets together: Not on file    Attends religious service: Not on file    Active member of club or organization: Not on file    Attends meetings  of clubs or organizations: Not on file    Relationship status: Not on file  Other Topics Concern  . Not on file  Social History Narrative  . Not on file    Outpatient Encounter Medications as of 06/25/2018  Medication Sig  . cholecalciferol (VITAMIN D) 1000 UNITS tablet Take 1,000 Units by mouth daily.  . metFORMIN (GLUCOPHAGE) 500 MG tablet TAKE 2 TABLETS BY MOUTH TWICE DAILY WITH THE MORNING AND EVENING MEAL  . mometasone (NASONEX) 50 MCG/ACT nasal spray Place 2 sprays into the nose daily.  . Multiple Vitamin (MULTIVITAMIN WITH MINERALS) TABS tablet Take 1 tablet by mouth daily.  . Olmesartan-amLODIPine-HCTZ 40-10-12.5 MG TABS TAKE 1 TABLET BY MOUTH DAILY  . ONE TOUCH ULTRA TEST test strip   . simvastatin (ZOCOR) 40 MG  tablet TAKE 1 TABLET BY MOUTH EVERY EVENING  . tamsulosin (FLOMAX) 0.4 MG CAPS capsule Take 0.4 mg by mouth daily.  . valACYclovir (VALTREX) 500 MG tablet    No facility-administered encounter medications on file as of 06/25/2018.     Activities of Daily Living In your present state of health, do you have any difficulty performing the following activities: 06/25/2018  Hearing? N  Vision? N  Difficulty concentrating or making decisions? N  Walking or climbing stairs? N  Dressing or bathing? N  Doing errands, shopping? N  Preparing Food and eating ? N  Using the Toilet? N  In the past six months, have you accidently leaked urine? N  Do you have problems with loss of bowel control? N  Managing your Medications? N  Managing your Finances? N  Housekeeping or managing your Housekeeping? N  Some recent data might be hidden    Patient Care Team: [name], FNP as PCP - General (General Practice)   Assessment:   This is a routine wellness examination for [place].  Exercise Activities and Dietary recommendations Current Exercise Habits: Home exercise routine, Type of exercise: strength training/weights, Time (Minutes): 40, Frequency (Times/Week): 3, Weekly Exercise (Minutes/Week): 120  Goals    . Patient Stated     Wants get get rid of diabetes       Fall Risk Fall Risk  06/25/2018 01/19/2018 12/19/2017  Falls in the past year? 0 0 0  Risk for fall due to : Medication side effect - -  Follow up Falls prevention discussed;Education provided - -   Is the patient's home free of loose throw rugs in walkways, pet beds, electrical cords, etc?   yes      Grab bars in the bathroom? no      Handrails on the stairs?   yes      Adequate lighting?   yes  Timed Get Up and Go Performed: n/a  Depression Screen PHQ 2/9 Scores 06/25/2018 01/19/2018 12/19/2017 09/03/2014  PHQ - 2 Score 0 0 0 0  PHQ- 9 Score 0 - - -    Cognitive Function     6CIT Screen 06/25/2018  What Year? 0 points  What  month? 0 points  What time? 0 points  Count back from 20 0 points  Months in reverse 0 points  Repeat phrase 2 points  Total Score 2    Immunization History  Administered Date(s) Administered  . Influenza-Unspecified 11/10/2017  . Pneumococcal-Unspecified 11/02/2012, 02/09/2014  . Tdap 11/02/2012    Qualifies for Shingles Vaccine? yes  Screening Tests Health Maintenance  Topic Date Due  . Hepatitis C Screening  [birth year]  . FOOT EXAM  02/01/1956  .  OPHTHALMOLOGY EXAM  02/01/1956  . PNA vac Low Risk Adult (2 of 2 - PCV13) 02/10/2015  . INFLUENZA VACCINE  08/15/2018  . HEMOGLOBIN A1C  09/20/2018  . TETANUS/TDAP  11/03/2022  . COLONOSCOPY  03/05/2024   Cancer Screenings: Lung: Low Dose CT Chest recommended if Age 59-80 years, 30 pack-year currently smoking OR have quit w/in 15years. Patient does not qualify. Colorectal: up to date  Additional Screenings:  Hepatitis C Screening:due      Plan:    Wants to get rid of diabetes  I have personally reviewed and noted the following in the patient's chart:   . Medical and social history . Use of alcohol, tobacco or illicit drugs  . Current medications and supplements . Functional ability and status . Nutritional status . Physical activity . Advanced directives . List of other physicians . Hospitalizations, surgeries, and ER visits in previous 12 months . Vitals . Screenings to include cognitive, depression, and falls . Referrals and appointments  In addition, I have reviewed and discussed with patient certain preventive protocols, quality metrics, and best practice recommendations. A written personalized care plan for preventive services as well as general preventive health recommendations were provided to patient.     Kellie Simmering, LPN  07/03/3557

## 2018-06-26 LAB — CBC
Hematocrit: 37.6 % (ref 37.5–51.0)
Hemoglobin: 13.9 g/dL (ref 13.0–17.7)
MCH: 34.8 pg — ABNORMAL HIGH (ref 26.6–33.0)
MCHC: 37 g/dL — ABNORMAL HIGH (ref 31.5–35.7)
MCV: 94 fL (ref 79–97)
Platelets: 229 10*3/uL (ref 150–450)
RBC: 3.99 x10E6/uL — ABNORMAL LOW (ref 4.14–5.80)
RDW: 11.9 % (ref 11.6–15.4)
WBC: 5.5 10*3/uL (ref 3.4–10.8)

## 2018-06-26 LAB — LIPID PANEL
Chol/HDL Ratio: 2.6 ratio (ref 0.0–5.0)
Cholesterol, Total: 134 mg/dL (ref 100–199)
HDL: 51 mg/dL
LDL Calculated: 70 mg/dL (ref 0–99)
Triglycerides: 63 mg/dL (ref 0–149)
VLDL Cholesterol Cal: 13 mg/dL (ref 5–40)

## 2018-06-26 LAB — PSA: Prostate Specific Ag, Serum: 5.2 ng/mL — ABNORMAL HIGH (ref 0.0–4.0)

## 2018-06-26 LAB — CMP14 + ANION GAP
ALT: 22 [IU]/L (ref 0–44)
AST: 25 [IU]/L (ref 0–40)
Albumin/Globulin Ratio: 2.1 (ref 1.2–2.2)
Albumin: 4.8 g/dL — ABNORMAL HIGH (ref 3.7–4.7)
Alkaline Phosphatase: 68 [IU]/L (ref 39–117)
Anion Gap: 15 mmol/L (ref 10.0–18.0)
BUN/Creatinine Ratio: 16 (ref 10–24)
BUN: 17 mg/dL (ref 8–27)
Bilirubin Total: 0.7 mg/dL (ref 0.0–1.2)
CO2: 23 mmol/L (ref 20–29)
Calcium: 10.3 mg/dL — ABNORMAL HIGH (ref 8.6–10.2)
Chloride: 99 mmol/L (ref 96–106)
Creatinine, Ser: 1.07 mg/dL (ref 0.76–1.27)
GFR calc Af Amer: 80 mL/min/{1.73_m2}
GFR calc non Af Amer: 69 mL/min/{1.73_m2}
Globulin, Total: 2.3 g/dL (ref 1.5–4.5)
Glucose: 207 mg/dL — ABNORMAL HIGH (ref 65–99)
Potassium: 4.5 mmol/L (ref 3.5–5.2)
Sodium: 137 mmol/L (ref 134–144)
Total Protein: 7.1 g/dL (ref 6.0–8.5)

## 2018-06-26 LAB — HEMOGLOBIN A1C
Est. average glucose Bld gHb Est-mCnc: 151 mg/dL
Hgb A1c MFr Bld: 6.9 % — ABNORMAL HIGH (ref 4.8–5.6)

## 2018-06-30 NOTE — Progress Notes (Signed)
Okay, please send a copy of his labs to Dr. [name]

## 2018-07-03 ENCOUNTER — Other Ambulatory Visit: Payer: Self-pay | Admitting: Nurse Practitioner

## 2018-07-05 ENCOUNTER — Encounter: Payer: Self-pay | Admitting: Nurse Practitioner

## 2018-08-30 ENCOUNTER — Other Ambulatory Visit: Payer: Self-pay | Admitting: Nurse Practitioner

## 2018-09-10 ENCOUNTER — Other Ambulatory Visit: Payer: Self-pay | Admitting: Nurse Practitioner

## 2018-09-29 ENCOUNTER — Other Ambulatory Visit: Payer: Self-pay

## 2018-09-29 ENCOUNTER — Telehealth: Payer: Self-pay | Admitting: Nurse Practitioner

## 2018-09-29 NOTE — Chronic Care Management (AMB) (Signed)
Chronic Care Management   Note  09/29/2018 Name: Ricky Shields MRN: [id number] DOB: May 22, 1945  Ricky Shields is a 73 y.o. year old male who is a primary care patient of [name], [place]. I reached out to Ricky Shields by phone today in response to a referral sent by Ricky Shields's health plan.    Ricky Shields was given information about Chronic Care Management services today including:  1. CCM service includes personalized support from designated clinical staff supervised by his physician, including individualized plan of care and coordination with other care providers 2. 24/7 contact phone numbers for assistance for urgent and routine care needs. 3. Service will only be billed when office clinical staff spend 20 minutes or more in a month to coordinate care. 4. Only one practitioner may furnish and bill the service in a calendar month. 5. The patient may stop CCM services at any time (effective at the end of the month) by phone call to the office staff. 6. The patient will be responsible for cost sharing (co-pay) of up to 20% of the service fee (after annual deductible is met).  Patient did not agree to enrollment in care management services and does not wish to consider at this time.  Follow up plan: The patient has been provided with contact information for the chronic care management team and has been advised to call with any health related questions or concerns.   [place]  [email or web address]   ??[phone]

## 2018-09-30 ENCOUNTER — Other Ambulatory Visit: Payer: Self-pay

## 2018-09-30 ENCOUNTER — Encounter: Payer: Self-pay | Admitting: Nurse Practitioner

## 2018-09-30 ENCOUNTER — Ambulatory Visit (INDEPENDENT_AMBULATORY_CARE_PROVIDER_SITE_OTHER): Payer: PPO | Admitting: Nurse Practitioner

## 2018-09-30 VITALS — BP 136/82 | HR 79 | Temp 98.8°F | Ht 68.0 in | Wt 163.0 lb

## 2018-09-30 DIAGNOSIS — I10 Essential (primary) hypertension: Secondary | ICD-10-CM | POA: Diagnosis not present

## 2018-09-30 DIAGNOSIS — E119 Type 2 diabetes mellitus without complications: Secondary | ICD-10-CM

## 2018-09-30 DIAGNOSIS — Z23 Encounter for immunization: Secondary | ICD-10-CM | POA: Diagnosis not present

## 2018-09-30 MED ORDER — PNEUMOCOCCAL 13-VAL CONJ VACC IM SUSP: 0.5000 mL | INTRAMUSCULAR | 0 refills | Status: AC

## 2018-09-30 MED ORDER — SIMVASTATIN 40 MG PO TABS: 40.0000 mg | ORAL_TABLET | Freq: Every evening | ORAL | 0 refills | Status: DC

## 2018-09-30 NOTE — Progress Notes (Signed)
Subjective:     Patient ID: Ricky Shields , male    DOB: 1945-06-28 , 73 y.o.   MRN: [id number]   Chief Complaint  Patient presents with  . Diabetes   Men's preventive visit. Patient Health Questionnaire (PHQ-2) is    Office Visit from 09/30/2018 in [facility]  PHQ-2 Total Score  0     Patient is on a Regular diet mostly healthy. Marital status: Married. Relevant history for alcohol use is:  Social History   Substance and Sexual Activity  Alcohol Use No  . Alcohol/week: 0.0 standard drinks  . Relevant history for tobacco use is:  Social History   Tobacco Use  Smoking Status Never Smoker  Smokeless Tobacco Never Used  . HPI  Diabetes He presents for his follow-up diabetic visit. He has type 2 diabetes mellitus. His disease course has been stable. There are no hypoglycemic associated symptoms. Pertinent negatives for hypoglycemia include no dizziness or headaches. Pertinent negatives for diabetes include no blurred vision, no chest pain, no polydipsia, no polyphagia and no polyuria. There are no hypoglycemic complications. Symptoms are stable. There are no diabetic complications. Risk factors for coronary artery disease include male sex and sedentary lifestyle. Current diabetic treatment includes oral agent (monotherapy). He is compliant with treatment all of the time. His weight is stable. He is following a diabetic diet. When asked about meal planning, he reported none. He has not had a previous visit with a dietitian. He participates in exercise intermittently. (Does not check blood sugar regularly) An ACE inhibitor/angiotensin II receptor blocker is being taken. He does not see a podiatrist.Eye exam is current (due for one in December).  Hypertension This is a chronic problem. The current episode started more than 1 year ago. The problem is unchanged. The problem is controlled. Pertinent negatives include no anxiety, blurred vision, chest pain, headaches or  palpitations. There are no associated agents to hypertension. Risk factors for coronary artery disease include sedentary lifestyle and male gender. Past treatments include angiotensin blockers and diuretics. The current treatment provides no improvement. There are no compliance problems.  There is no history of angina. There is no history of chronic renal disease.     Past Medical History:  Diagnosis Date  . Back pain    intermittent-prior military injury  . BPH (benign prostatic hypertrophy) with urinary retention   . Diabetes mellitus without complication ([place])   . GERD (gastroesophageal reflux disease)   . High cholesterol   . Hypertension      Family History  Problem Relation Age of Onset  . Stroke Mother   . Stroke Father      Current Outpatient Medications:  .  cholecalciferol (VITAMIN D) 1000 UNITS tablet, Take 1,000 Units by mouth daily., Disp: , Rfl:  .  metFORMIN (GLUCOPHAGE) 500 MG tablet, TAKE 2 TABLETS BY MOUTH TWICE DAILY WITH THE MORNING AND EVENING MEAL, Disp: 360 tablet, Rfl: 0 .  Multiple Vitamin (MULTIVITAMIN WITH MINERALS) TABS tablet, Take 1 tablet by mouth daily., Disp: , Rfl:  .  Olmesartan-amLODIPine-HCTZ 40-10-12.5 MG TABS, TAKE 1 TABLET BY MOUTH DAILY, Disp: 90 tablet, Rfl: 0 .  ONE TOUCH ULTRA TEST test strip, , Disp: , Rfl: 0 .  simvastatin (ZOCOR) 40 MG tablet, TAKE 1 TABLET BY MOUTH EVERY EVENING, Disp: 90 tablet, Rfl: 0 .  tamsulosin (FLOMAX) 0.4 MG CAPS capsule, Take 0.4 mg by mouth daily., Disp: , Rfl: 5 .  valACYclovir (VALTREX) 500 MG tablet, , Disp: ,  Rfl: 0 .  mometasone (NASONEX) 50 MCG/ACT nasal spray, Place 2 sprays into the nose daily. (Patient not taking: Reported on 09/30/2018), Disp: 17 g, Rfl: 2   Allergies  Allergen Reactions  . Penicillins Rash    Has patient had a PCN reaction causing immediate rash, facial/tongue/throat swelling, SOB or lightheadedness with hypotension: No Has patient had a PCN reaction causing severe rash involving  mucus membranes or skin necrosis: No Has patient had a PCN reaction that required hospitalization No Has patient had a PCN reaction occurring within the last 10 years: No If all of the above answers are "NO", then may proceed with Cephalosporin use.      Review of Systems  Constitutional: Negative.   Eyes: Negative.  Negative for blurred vision.  Respiratory: Negative.   Cardiovascular: Negative.  Negative for chest pain, palpitations and leg swelling.  Endocrine: Negative for polydipsia, polyphagia and polyuria.  Musculoskeletal: Negative.   Neurological: Negative for dizziness and headaches.  Hematological: Negative.   Psychiatric/Behavioral: Negative.      Today's Vitals   09/30/18 1121  BP: 136/82  Pulse: 79  Temp: 98.8 F (37.1 C)  TempSrc: Oral  Weight: 163 lb (73.9 kg)  Height: 5\' 8"  (1.727 m)  PainSc: 0-No pain   Body mass index is 24.78 kg/m.   Objective:  Physical Exam Vitals signs reviewed.  Constitutional:      Appearance: Normal appearance. He is normal weight.  HENT:     Head: Normocephalic and atraumatic.  Neck:     Musculoskeletal: Normal range of motion and neck supple.  Cardiovascular:     Rate and Rhythm: Normal rate and regular rhythm.     Pulses: Normal pulses.     Heart sounds: Normal heart sounds. No murmur.  Pulmonary:     Effort: Pulmonary effort is normal. No respiratory distress.     Breath sounds: Normal breath sounds.  Abdominal:     General: There is no distension.  Musculoskeletal: Normal range of motion.  Skin:    General: Skin is warm.     Capillary Refill: Capillary refill takes less than 2 seconds.  Neurological:     General: No focal deficit present.     Mental Status: He is alert and oriented to person, place, and time.  Psychiatric:        Mood and Affect: Mood normal.        Behavior: Behavior normal.        Thought Content: Thought content normal.        Judgment: Judgment normal.         Assessment And Plan:      1. Essential hypertension . B/P is well controlled.  . CMP ordered to check renal function.  . The importance of regular exercise and dietary modification was stressed to the patient. .  - CMP14 + Anion Gap  2. Type 2 diabetes mellitus without complication, without long-term current use of insulin (HCC)  Chronic, controlled  Continue with current medications  Encouraged to limit intake of sugary foods and drinks  Encouraged to continue with physical activity to 150 minutes per week - Hemoglobin A1c  3. Encounter for immunization  Sent to pharmacy - pneumococcal 13-valent conjugate vaccine (PREVNAR 13) SUSP injection; Inject 0.5 mLs into the muscle tomorrow at 10 am for 1 dose.  Dispense: 0.5 mL; Refill: 0      [name], FNP    THE PATIENT IS ENCOURAGED TO PRACTICE SOCIAL DISTANCING DUE TO THE COVID-19  PANDEMIC.

## 2018-10-01 LAB — CMP14 + ANION GAP
ALT: 21 IU/L (ref 0–44)
AST: 22 IU/L (ref 0–40)
Albumin/Globulin Ratio: 1.7 (ref 1.2–2.2)
Albumin: 4.4 g/dL (ref 3.7–4.7)
Alkaline Phosphatase: 67 IU/L (ref 39–117)
Anion Gap: 12 mmol/L (ref 10.0–18.0)
BUN/Creatinine Ratio: 9 — ABNORMAL LOW (ref 10–24)
BUN: 9 mg/dL (ref 8–27)
Bilirubin Total: 0.6 mg/dL (ref 0.0–1.2)
CO2: 23 mmol/L (ref 20–29)
Calcium: 9.8 mg/dL (ref 8.6–10.2)
Chloride: 105 mmol/L (ref 96–106)
Creatinine, Ser: 1.04 mg/dL (ref 0.76–1.27)
GFR calc Af Amer: 83 mL/min/{1.73_m2} (ref >59)
GFR calc non Af Amer: 71 mL/min/{1.73_m2} (ref >59)
Globulin, Total: 2.6 g/dL (ref 1.5–4.5)
Glucose: 109 mg/dL — ABNORMAL HIGH (ref 65–99)
Potassium: 4.5 mmol/L (ref 3.5–5.2)
Sodium: 140 mmol/L (ref 134–144)
Total Protein: 7 g/dL (ref 6.0–8.5)

## 2018-10-01 LAB — HEMOGLOBIN A1C
Est. average glucose Bld gHb Est-mCnc: 140 mg/dL
Hgb A1c MFr Bld: 6.5 % — ABNORMAL HIGH (ref 4.8–5.6)

## 2018-10-04 ENCOUNTER — Other Ambulatory Visit: Payer: Self-pay | Admitting: Student in an Organized Health Care Education/Training Program

## 2018-10-04 MED ORDER — VALACYCLOVIR HCL 500 MG PO TABS: 500.0000 mg | ORAL_TABLET | Freq: Every day | ORAL | 0 refills | Status: AC

## 2018-10-04 NOTE — Progress Notes (Signed)
Valtrex sent to pharmacy. Patient has been waiting over a week for it to be called in. 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 Called to let him know. He was very Patent attorney.

## 2018-11-24 DIAGNOSIS — R972 Elevated prostate specific antigen [PSA]: Secondary | ICD-10-CM | POA: Diagnosis not present

## 2018-11-24 DIAGNOSIS — Z1159 Encounter for screening for other viral diseases: Secondary | ICD-10-CM | POA: Diagnosis not present

## 2018-11-28 ENCOUNTER — Other Ambulatory Visit: Payer: Self-pay | Admitting: Nurse Practitioner

## 2018-11-30 ENCOUNTER — Other Ambulatory Visit: Payer: Self-pay

## 2018-11-30 MED ORDER — OLMESARTAN-AMLODIPINE-HCTZ 40-10-12.5 MG PO TABS: 1.0000 | ORAL_TABLET | Freq: Every day | ORAL | 0 refills | Status: DC

## 2018-12-01 DIAGNOSIS — R972 Elevated prostate specific antigen [PSA]: Secondary | ICD-10-CM | POA: Diagnosis not present

## 2018-12-01 DIAGNOSIS — N401 Enlarged prostate with lower urinary tract symptoms: Secondary | ICD-10-CM | POA: Diagnosis not present

## 2018-12-01 DIAGNOSIS — R350 Frequency of micturition: Secondary | ICD-10-CM | POA: Diagnosis not present

## 2018-12-27 ENCOUNTER — Other Ambulatory Visit: Payer: Self-pay | Admitting: Nurse Practitioner

## 2018-12-30 ENCOUNTER — Ambulatory Visit (INDEPENDENT_AMBULATORY_CARE_PROVIDER_SITE_OTHER): Payer: PPO | Admitting: Nurse Practitioner

## 2018-12-30 ENCOUNTER — Other Ambulatory Visit: Payer: Self-pay

## 2018-12-30 VITALS — BP 122/78 | HR 97 | Temp 98.8°F | Ht 68.0 in | Wt 163.0 lb

## 2018-12-30 DIAGNOSIS — I10 Essential (primary) hypertension: Secondary | ICD-10-CM

## 2018-12-30 DIAGNOSIS — E119 Type 2 diabetes mellitus without complications: Secondary | ICD-10-CM

## 2018-12-30 DIAGNOSIS — R2232 Localized swelling, mass and lump, left upper limb: Secondary | ICD-10-CM

## 2018-12-30 NOTE — Progress Notes (Signed)
Subjective:     Patient ID: Ricky Shields , male    DOB: 1945-05-16 , 73 y.o.   MRN: [id number]   Chief Complaint  Patient presents with  . Diabetes  . Hypertension   HPI  He reports a firm area to his left antecubital area.  States was occurred after his last blood draw here at the office.  He did not apply any compresses. Denies pain to the area.    Diabetes He presents for his follow-up diabetic visit. He has type 2 diabetes mellitus. His disease course has been stable. There are no hypoglycemic associated symptoms. Pertinent negatives for hypoglycemia include no dizziness or headaches. Pertinent negatives for diabetes include no blurred vision, no chest pain, no polydipsia, no polyphagia and no polyuria. There are no hypoglycemic complications. Symptoms are stable. There are no diabetic complications. Risk factors for coronary artery disease include male sex and sedentary lifestyle. Current diabetic treatment includes oral agent (monotherapy). He is compliant with treatment all of the time. His weight is stable. He is following a diabetic diet. When asked about meal planning, he reported none. He has not had a previous visit with a dietitian. He participates in exercise intermittently. (This morning blood sugar was 157 - ate grilled cheese and [place] sandwich and banana.) An ACE inhibitor/angiotensin II receptor blocker is being taken. He does not see a podiatrist.Eye exam is current (scheduled for tomorrow).  Hypertension This is a chronic problem. The current episode started more than 1 year ago. The problem is unchanged. The problem is controlled. Pertinent negatives include no anxiety, blurred vision, chest pain, headaches or palpitations. There are no associated agents to hypertension. Risk factors for coronary artery disease include sedentary lifestyle and male gender. Past treatments include angiotensin blockers and diuretics. The current treatment provides no improvement. There are no  compliance problems.  There is no history of angina. There is no history of chronic renal disease.     Past Medical History:  Diagnosis Date  . Back pain    intermittent-prior military injury  . BPH (benign prostatic hypertrophy) with urinary retention   . Diabetes mellitus without complication ([place])   . GERD (gastroesophageal reflux disease)   . High cholesterol   . Hypertension      Family History  Problem Relation Age of Onset  . Stroke Mother   . Stroke Father      Current Outpatient Medications:  .  cholecalciferol (VITAMIN D) 1000 UNITS tablet, Take 1,000 Units by mouth daily., Disp: , Rfl:  .  metFORMIN (GLUCOPHAGE) 500 MG tablet, TAKE 2 TABLETS BY MOUTH TWICE DAILY WITH THE MORNING AND EVENING MEAL, Disp: 360 tablet, Rfl: 0 .  mometasone (NASONEX) 50 MCG/ACT nasal spray, Place 2 sprays into the nose daily., Disp: 17 g, Rfl: 2 .  Multiple Vitamin (MULTIVITAMIN WITH MINERALS) TABS tablet, Take 1 tablet by mouth daily., Disp: , Rfl:  .  Olmesartan-amLODIPine-HCTZ 40-10-12.5 MG TABS, Take 1 tablet by mouth daily., Disp: 90 tablet, Rfl: 0 .  ONE TOUCH ULTRA TEST test strip, , Disp: , Rfl: 0 .  simvastatin (ZOCOR) 40 MG tablet, TAKE 1 TABLET(40 MG) BY MOUTH EVERY EVENING, Disp: 90 tablet, Rfl: 0 .  tamsulosin (FLOMAX) 0.4 MG CAPS capsule, Take 0.4 mg by mouth daily., Disp: , Rfl: 5 .  valACYclovir (VALTREX) 500 MG tablet, Take 1 tablet (500 mg total) by mouth daily., Disp: 90 tablet, Rfl: 0 .  FLUZONE HIGH-DOSE QUADRIVALENT 0.7 ML SUSY, , Disp: , Rfl:  Allergies  Allergen Reactions  . Penicillins Rash    Has patient had a PCN reaction causing immediate rash, facial/tongue/throat swelling, SOB or lightheadedness with hypotension: No Has patient had a PCN reaction causing severe rash involving mucus membranes or skin necrosis: No Has patient had a PCN reaction that required hospitalization No Has patient had a PCN reaction occurring within the last 10 years: No If all of the  above answers are "NO", then may proceed with Cephalosporin use.      Review of Systems  Constitutional: Negative.   Eyes: Negative.  Negative for blurred vision.  Respiratory: Negative.   Cardiovascular: Negative.  Negative for chest pain, palpitations and leg swelling.  Endocrine: Negative for polydipsia, polyphagia and polyuria.  Musculoskeletal: Negative.   Neurological: Negative for dizziness and headaches.  Hematological: Negative.   Psychiatric/Behavioral: Negative.      Today's Vitals   12/30/18 1152  BP: 122/78  Pulse: 97  Temp: 98.8 F (37.1 C)  TempSrc: Oral  Weight: 163 lb (73.9 kg)  Height: 5\' 8"  (1.727 m)  PainSc: 0-No pain   Body mass index is 24.78 kg/m.   Objective:  Physical Exam Vitals reviewed.  Constitutional:      Appearance: Normal appearance. He is normal weight.  HENT:     Head: Normocephalic and atraumatic.  Cardiovascular:     Rate and Rhythm: Normal rate and regular rhythm.     Pulses: Normal pulses.     Heart sounds: Normal heart sounds. No murmur.  Pulmonary:     Effort: Pulmonary effort is normal. No respiratory distress.     Breath sounds: Normal breath sounds.  Abdominal:     General: There is no distension.  Musculoskeletal:        General: Normal range of motion.     Cervical back: Normal range of motion and neck supple.  Skin:    General: Skin is warm.     Capillary Refill: Capillary refill takes less than 2 seconds.     Comments: Has an elevated area to the [facility] area of left arm, slightly firm.   Neurological:     General: No focal deficit present.     Mental Status: He is alert and oriented to person, place, and time.  Psychiatric:        Mood and Affect: Mood normal.        Behavior: Behavior normal.        Thought Content: Thought content normal.        Judgment: Judgment normal.         Assessment And Plan:     1. Essential hypertension . B/P is well controlled.  . CMP ordered to check renal function.  . The  importance of regular exercise and dietary modification was stressed to the patient.   - CMP14 + Anion Gap  2. Type 2 diabetes mellitus without complication, without long-term current use of insulin (HCC)  Chronic, controlled  Continue with current medications  Encouraged to limit intake of sugary foods and drinks  Encouraged to continue with physical activity to 150 minutes per week - Hemoglobin A1c  3. Mass of left arm  He reports this mass occurred after his last blood draw in Aug/Sep  Firm area noted to left axilla  He is to apply a warm compress two times a day, will consider obtaining an ultrasound   Hematoma vs cyst    [name], FNP    THE PATIENT IS ENCOURAGED TO PRACTICE SOCIAL DISTANCING  DUE TO THE COVID-19 PANDEMIC.

## 2018-12-31 DIAGNOSIS — H0234 Blepharochalasis left upper eyelid: Secondary | ICD-10-CM | POA: Diagnosis not present

## 2018-12-31 DIAGNOSIS — H35363 Drusen (degenerative) of macula, bilateral: Secondary | ICD-10-CM | POA: Diagnosis not present

## 2018-12-31 DIAGNOSIS — H2513 Age-related nuclear cataract, bilateral: Secondary | ICD-10-CM | POA: Diagnosis not present

## 2018-12-31 DIAGNOSIS — E119 Type 2 diabetes mellitus without complications: Secondary | ICD-10-CM | POA: Diagnosis not present

## 2018-12-31 LAB — CMP14+EGFR
ALT: 20 IU/L (ref 0–44)
AST: 22 IU/L (ref 0–40)
Albumin/Globulin Ratio: 1.8 (ref 1.2–2.2)
Albumin: 4.6 g/dL (ref 3.7–4.7)
Alkaline Phosphatase: 85 IU/L (ref 39–117)
BUN/Creatinine Ratio: 14 (ref 10–24)
BUN: 15 mg/dL (ref 8–27)
Bilirubin Total: 0.5 mg/dL (ref 0.0–1.2)
CO2: 21 mmol/L (ref 20–29)
Calcium: 10.4 mg/dL — ABNORMAL HIGH (ref 8.6–10.2)
Chloride: 101 mmol/L (ref 96–106)
Creatinine, Ser: 1.06 mg/dL (ref 0.76–1.27)
GFR calc Af Amer: 81 mL/min/{1.73_m2} (ref >59)
GFR calc non Af Amer: 70 mL/min/{1.73_m2} (ref >59)
Globulin, Total: 2.6 g/dL (ref 1.5–4.5)
Glucose: 130 mg/dL — ABNORMAL HIGH (ref 65–99)
Potassium: 4.2 mmol/L (ref 3.5–5.2)
Sodium: 138 mmol/L (ref 134–144)
Total Protein: 7.2 g/dL (ref 6.0–8.5)

## 2018-12-31 LAB — LIPID PANEL
Chol/HDL Ratio: 2.8 ratio (ref 0.0–5.0)
Cholesterol, Total: 124 mg/dL (ref 100–199)
HDL: 45 mg/dL (ref >39)
LDL Chol Calc (NIH): 65 mg/dL (ref 0–99)
Triglycerides: 68 mg/dL (ref 0–149)
VLDL Cholesterol Cal: 14 mg/dL (ref 5–40)

## 2018-12-31 LAB — HEMOGLOBIN A1C
Est. average glucose Bld gHb Est-mCnc: 148 mg/dL
Hgb A1c MFr Bld: 6.8 % — ABNORMAL HIGH (ref 4.8–5.6)

## 2018-12-31 LAB — HM DIABETES EYE EXAM

## 2019-01-03 ENCOUNTER — Encounter: Payer: Self-pay | Admitting: Nurse Practitioner

## 2019-01-04 ENCOUNTER — Encounter: Payer: Self-pay | Admitting: Internal Medicine

## 2019-03-01 ENCOUNTER — Other Ambulatory Visit: Payer: Self-pay | Admitting: Nurse Practitioner

## 2019-04-10 ENCOUNTER — Other Ambulatory Visit: Payer: Self-pay | Admitting: Nurse Practitioner

## 2019-05-28 ENCOUNTER — Other Ambulatory Visit: Payer: Self-pay | Admitting: Nurse Practitioner

## 2019-07-01 ENCOUNTER — Ambulatory Visit: Payer: PPO

## 2019-07-01 ENCOUNTER — Encounter: Payer: PPO | Admitting: Nurse Practitioner

## 2019-07-08 ENCOUNTER — Ambulatory Visit (INDEPENDENT_AMBULATORY_CARE_PROVIDER_SITE_OTHER): Payer: PPO

## 2019-07-08 ENCOUNTER — Encounter: Payer: Self-pay | Admitting: Nurse Practitioner

## 2019-07-08 ENCOUNTER — Ambulatory Visit (INDEPENDENT_AMBULATORY_CARE_PROVIDER_SITE_OTHER): Payer: PPO | Admitting: Nurse Practitioner

## 2019-07-08 ENCOUNTER — Other Ambulatory Visit: Payer: Self-pay

## 2019-07-08 VITALS — BP 128/80 | HR 95 | Temp 98.4°F | Ht 67.0 in | Wt 163.2 lb

## 2019-07-08 VITALS — BP 128/80 | HR 95 | Temp 98.4°F | Ht 67.0 in | Wt 163.1 lb

## 2019-07-08 DIAGNOSIS — I10 Essential (primary) hypertension: Secondary | ICD-10-CM

## 2019-07-08 DIAGNOSIS — M25559 Pain in unspecified hip: Secondary | ICD-10-CM | POA: Diagnosis not present

## 2019-07-08 DIAGNOSIS — Z0000 Encounter for general adult medical examination without abnormal findings: Secondary | ICD-10-CM

## 2019-07-08 DIAGNOSIS — E119 Type 2 diabetes mellitus without complications: Secondary | ICD-10-CM

## 2019-07-08 LAB — POCT URINALYSIS DIPSTICK
Bilirubin, UA: NEGATIVE
Blood, UA: NEGATIVE
Glucose, UA: NEGATIVE
Ketones, UA: NEGATIVE
Leukocytes, UA: NEGATIVE
Nitrite, UA: NEGATIVE
Protein, UA: NEGATIVE
Spec Grav, UA: 1.015 (ref 1.010–1.025)
Urobilinogen, UA: 0.2 E.U./dL
pH, UA: 6.5 (ref 5.0–8.0)

## 2019-07-08 LAB — POCT UA - MICROALBUMIN
Albumin/Creatinine Ratio, Urine, POC: <30
Creatinine, POC: 200 mg/dL
Microalbumin Ur, POC: 30 mg/L

## 2019-07-08 MED ORDER — DICLOFENAC SODIUM 1 % EX GEL: 2.0000 g | Freq: Four times a day (QID) | CUTANEOUS | 2 refills | Status: DC

## 2019-07-08 MED ORDER — TRIAMCINOLONE ACETONIDE 40 MG/ML IJ SUSP
40.0000 mg | Freq: Once | INTRAMUSCULAR | Status: AC
  Administered 2019-07-08: 40 mg via INTRAMUSCULAR

## 2019-07-08 MED ORDER — KETOROLAC TROMETHAMINE 30 MG/ML IJ SOLN
30.0000 mg | Freq: Once | INTRAMUSCULAR | Status: AC
  Administered 2019-07-08: 30 mg via INTRAMUSCULAR

## 2019-07-08 NOTE — Patient Instructions (Signed)
Ricky Shields , Thank you for taking time to come for your Medicare Wellness Visit. I appreciate your ongoing commitment to your health goals. Please review the following plan we discussed and let me know if I can assist you in the future.   Screening recommendations/referrals: Colonoscopy: completed 03/05/2014, due 03/05/2024 Recommended yearly ophthalmology/optometry visit for glaucoma screening and checkup Recommended yearly dental visit for hygiene and checkup  Vaccinations: Influenza vaccine: completed 09/30/2018, due 08/15/2019 Pneumococcal vaccine: completed 09/30/2018 Tdap vaccine: completed 11/02/2012, due 11/03/2022 Shingles vaccine: discussed   Covid-19: 02/19/2019, 03/12/2019  Advanced directives: Please bring a copy of your POA (Power of Attorney) and/or Living Will to your next appointment.    Conditions/risks identified: overweight  Next appointment: 01/11/2020 at 11:30  Follow up in one year for your annual wellness visit.   Preventive Care 73 Years and Older, Male Preventive care refers to lifestyle choices and visits with your health care provider that can promote health and wellness. What does preventive care include?  A yearly physical exam. This is also called an annual well check.  Dental exams once or twice a year.  Routine eye exams. Ask your health care provider how often you should have your eyes checked.  Personal lifestyle choices, including:  Daily care of your teeth and gums.  Regular physical activity.  Eating a healthy diet.  Avoiding tobacco and drug use.  Limiting alcohol use.  Practicing safe sex.  Taking low doses of aspirin every day.  Taking vitamin and mineral supplements as recommended by your health care provider. What happens during an annual well check? The services and screenings done by your health care provider during your annual well check will depend on your age, overall health, lifestyle risk factors, and family history of  disease. Counseling  Your health care provider may ask you questions about your:  Alcohol use.  Tobacco use.  Drug use.  Emotional well-being.  Home and relationship well-being.  Sexual activity.  Eating habits.  History of falls.  Memory and ability to understand (cognition).  Work and work Statistician. Screening  You may have the following tests or measurements:  Height, weight, and BMI.  Blood pressure.  Lipid and cholesterol levels. These may be checked every 5 years, or more frequently if you are over 100 years old.  Skin check.  Lung cancer screening. You may have this screening every year starting at age 12 if you have a 30-pack-year history of smoking and currently smoke or have quit within the past 15 years.  Fecal occult blood test (FOBT) of the stool. You may have this test every year starting at age 47.  Flexible sigmoidoscopy or colonoscopy. You may have a sigmoidoscopy every 5 years or a colonoscopy every 10 years starting at age 22.  Prostate cancer screening. Recommendations will vary depending on your family history and other risks.  Hepatitis C blood test.  Hepatitis B blood test.  Sexually transmitted disease (STD) testing.  Diabetes screening. This is done by checking your blood sugar (glucose) after you have not eaten for a while (fasting). You may have this done every 1-3 years.  Abdominal aortic aneurysm (AAA) screening. You may need this if you are a current or former smoker.  Osteoporosis. You may be screened starting at age 77 if you are at high risk. Talk with your health care provider about your test results, treatment options, and if necessary, the need for more tests. Vaccines  Your health care provider may recommend certain vaccines, such  as:  Influenza vaccine. This is recommended every year.  Tetanus, diphtheria, and acellular pertussis (Tdap, Td) vaccine. You may need a Td booster every 10 years.  Zoster vaccine. You may  need this after age 82.  Pneumococcal 13-valent conjugate (PCV13) vaccine. One dose is recommended after age 22.  Pneumococcal polysaccharide (PPSV23) vaccine. One dose is recommended after age 28. Talk to your health care provider about which screenings and vaccines you need and how often you need them. This information is not intended to replace advice given to you by your health care provider. Make sure you discuss any questions you have with your health care provider. Document Released: 01/27/2015 Document Revised: 09/20/2015 Document Reviewed: 11/01/2014 Elsevier Interactive Patient Education  2017 [place] Prevention in the Home Falls can cause injuries. They can happen to people of all ages. There are many things you can do to make your home safe and to help prevent falls. What can I do on the outside of my home?  Regularly fix the edges of walkways and driveways and fix any cracks.  Remove anything that might make you trip as you walk through a door, such as a raised step or threshold.  Trim any bushes or trees on the path to your home.  Use bright outdoor lighting.  Clear any walking paths of anything that might make someone trip, such as rocks or tools.  Regularly check to see if handrails are loose or broken. Make sure that both sides of any steps have handrails.  Any raised decks and porches should have guardrails on the edges.  Have any leaves, snow, or ice cleared regularly.  Use sand or salt on walking paths during winter.  Clean up any spills in your garage right away. This includes oil or grease spills. What can I do in the bathroom?  Use night lights.  Install grab bars by the toilet and in the tub and shower. Do not use towel bars as grab bars.  Use non-skid mats or decals in the tub or shower.  If you need to sit down in the shower, use a plastic, non-slip stool.  Keep the floor dry. Clean up any water that spills on the floor as soon as it  happens.  Remove soap buildup in the tub or shower regularly.  Attach bath mats securely with double-sided non-slip rug tape.  Do not have throw rugs and other things on the floor that can make you trip. What can I do in the bedroom?  Use night lights.  Make sure that you have a light by your bed that is easy to reach.  Do not use any sheets or blankets that are too big for your bed. They should not hang down onto the floor.  Have a firm chair that has side arms. You can use this for support while you get dressed.  Do not have throw rugs and other things on the floor that can make you trip. What can I do in the kitchen?  Clean up any spills right away.  Avoid walking on wet floors.  Keep items that you use a lot in easy-to-reach places.  If you need to reach something above you, use a strong step stool that has a grab bar.  Keep electrical cords out of the way.  Do not use floor polish or wax that makes floors slippery. If you must use wax, use non-skid floor wax.  Do not have throw rugs and other things on the  floor that can make you trip. What can I do with my stairs?  Do not leave any items on the stairs.  Make sure that there are handrails on both sides of the stairs and use them. Fix handrails that are broken or loose. Make sure that handrails are as long as the stairways.  Check any carpeting to make sure that it is firmly attached to the stairs. Fix any carpet that is loose or worn.  Avoid having throw rugs at the top or bottom of the stairs. If you do have throw rugs, attach them to the floor with carpet tape.  Make sure that you have a light switch at the top of the stairs and the bottom of the stairs. If you do not have them, ask someone to add them for you. What else can I do to help prevent falls?  Wear shoes that:  Do not have high heels.  Have rubber bottoms.  Are comfortable and fit you well.  Are closed at the toe. Do not wear sandals.  If you  use a stepladder:  Make sure that it is fully opened. Do not climb a closed stepladder.  Make sure that both sides of the stepladder are locked into place.  Ask someone to hold it for you, if possible.  Clearly mark and make sure that you can see:  Any grab bars or handrails.  First and last steps.  Where the edge of each step is.  Use tools that help you move around (mobility aids) if they are needed. These include:  Canes.  Walkers.  Scooters.  Crutches.  Turn on the lights when you go into a dark area. Replace any light bulbs as soon as they burn out.  Set up your furniture so you have a clear path. Avoid moving your furniture around.  If any of your floors are uneven, fix them.  If there are any pets around you, be aware of where they are.  Review your medicines with your doctor. Some medicines can make you feel dizzy. This can increase your chance of falling. Ask your doctor what other things that you can do to help prevent falls. This information is not intended to replace advice given to you by your health care provider. Make sure you discuss any questions you have with your health care provider. Document Released: 10/27/2008 Document Revised: 06/08/2015 Document Reviewed: 02/04/2014 Elsevier Interactive Patient Education  2017 [employer].

## 2019-07-08 NOTE — Progress Notes (Signed)
Subjective:     Patient ID: Ricky Shields , male    DOB: 25-Feb-1945 , 74 y.o.   MRN: [id number]   Chief Complaint  Patient presents with  . Annual Exam   Men's preventive visit. Patient Health Questionnaire (PHQ-2) is    Clinical Support from 07/08/2019 in [facility]  PHQ-2 Total Score 0     Patient is on a Regular diet mostly healthy. Marital status: Married. Relevant history for alcohol use is:  Social History   Substance and Sexual Activity  Alcohol Use No  . Alcohol/week: 0.0 standard drinks  . Relevant history for tobacco use is:  Social History   Tobacco Use  Smoking Status Never Smoker  Smokeless Tobacco Never Used  . HPI  Here for HM  He continues to go to [facility]  Hypertension This is a chronic problem. The current episode started more than 1 year ago. The problem is controlled. Pertinent negatives include no anxiety, blurred vision, chest pain, headaches or palpitations. There are no associated agents to hypertension. Risk factors for coronary artery disease include sedentary lifestyle and male gender. Past treatments include angiotensin blockers and diuretics. The current treatment provides no improvement. There are no compliance problems.  There is no history of angina. There is no history of chronic renal disease.  Diabetes He presents for his follow-up diabetic visit. He has type 2 diabetes mellitus. His disease course has been stable. There are no hypoglycemic associated symptoms. Pertinent negatives for hypoglycemia include no dizziness or headaches. Pertinent negatives for diabetes include no blurred vision, no chest pain, no fatigue, no polydipsia, no polyphagia and no polyuria. There are no hypoglycemic complications. Symptoms are stable. There are no diabetic complications. Risk factors for coronary artery disease include male sex and sedentary lifestyle. Current diabetic treatment includes oral agent (monotherapy). He is  compliant with treatment all of the time. His weight is stable. He is following a diabetic diet. When asked about meal planning, he reported none. He has not had a previous visit with a dietitian. He participates in exercise intermittently. There is no change in his home blood glucose trend. An ACE inhibitor/angiotensin II receptor blocker is being taken. He does not see a podiatrist.Eye exam is current (01/10/2020).  Hip Pain  The incident occurred more than 1 week ago. There was no injury mechanism. Pain location: bilateral hips. The quality of the pain is described as aching. The pain has been constant since onset. Pertinent negatives include no inability to bear weight. He reports no foreign bodies present. He has tried nothing for the symptoms.     Past Medical History:  Diagnosis Date  . Back pain    intermittent-prior military injury  . BPH (benign prostatic hypertrophy) with urinary retention   . Diabetes mellitus without complication ([place])   . GERD (gastroesophageal reflux disease)   . High cholesterol   . Hypertension      Family History  Problem Relation Age of Onset  . Stroke Mother   . Stroke Father      Current Outpatient Medications:  .  cholecalciferol (VITAMIN D) 1000 UNITS tablet, Take 1,000 Units by mouth daily., Disp: , Rfl:  .  metFORMIN (GLUCOPHAGE) 500 MG tablet, TAKE 2 TABLETS BY MOUTH TWICE DAILY WITH THE MORNING AND EVENING MEAL, Disp: 360 tablet, Rfl: 0 .  mometasone (NASONEX) 50 MCG/ACT nasal spray, Place 2 sprays into the nose daily., Disp: 17 g, Rfl: 2 .  Multiple Vitamin (MULTIVITAMIN WITH MINERALS) TABS  tablet, Take 1 tablet by mouth daily., Disp: , Rfl:  .  Olmesartan-amLODIPine-HCTZ 40-10-12.5 MG TABS, TAKE 1 TABLET BY MOUTH DAILY, Disp: 90 tablet, Rfl: 0 .  ONE TOUCH ULTRA TEST test strip, , Disp: , Rfl: 0 .  simvastatin (ZOCOR) 40 MG tablet, TAKE 1 TABLET(40 MG) BY MOUTH EVERY EVENING, Disp: 90 tablet, Rfl: 0 .  tamsulosin (FLOMAX) 0.4 MG CAPS  capsule, Take 0.4 mg by mouth daily., Disp: , Rfl: 5   Allergies  Allergen Reactions  . Penicillins Rash    Has patient had a PCN reaction causing immediate rash, facial/tongue/throat swelling, SOB or lightheadedness with hypotension: No Has patient had a PCN reaction causing severe rash involving mucus membranes or skin necrosis: No Has patient had a PCN reaction that required hospitalization No Has patient had a PCN reaction occurring within the last 10 years: No If all of the above answers are "NO", then may proceed with Cephalosporin use.      Review of Systems  Constitutional: Negative.  Negative for fatigue.  Eyes: Negative.  Negative for blurred vision.  Respiratory: Negative.   Cardiovascular: Negative.  Negative for chest pain, palpitations and leg swelling.  Gastrointestinal: Negative.   Endocrine: Negative for polydipsia, polyphagia and polyuria.  Musculoskeletal: Positive for arthralgias (bilateral hips pain).  Skin: Negative.   Neurological: Negative for dizziness and headaches.  Hematological: Negative.   Psychiatric/Behavioral: Negative.      Today's Vitals   07/08/19 1140  BP: 128/80  Pulse: 95  Temp: 98.4 F (36.9 C)  TempSrc: Oral  Weight: 163 lb 2.3 oz (74 kg)  Height: _0  (1.702 m)  PainSc: 0-No pain   Body mass index is 25.55 kg/m.   Objective:  Physical Exam Vitals reviewed.  Constitutional:      Appearance: Normal appearance.  HENT:     Head: Normocephalic and atraumatic.     Right Ear: Tympanic membrane, ear canal and external ear normal. There is no impacted cerumen.     Left Ear: Tympanic membrane, ear canal and external ear normal. There is no impacted cerumen.  Cardiovascular:     Rate and Rhythm: Normal rate and regular rhythm.     Pulses: Normal pulses.     Heart sounds: Normal heart sounds. No murmur heard.   Pulmonary:     Effort: Pulmonary effort is normal. No respiratory distress.     Breath sounds: Normal breath sounds.   Abdominal:     General: Abdomen is flat. Bowel sounds are normal. There is no distension.     Palpations: Abdomen is soft.  Genitourinary:    Comments: Deferred - sees Urologist Musculoskeletal:        General: Normal range of motion.     Cervical back: Normal range of motion and neck supple.  Skin:    General: Skin is warm.     Capillary Refill: Capillary refill takes less than 2 seconds.  Neurological:     General: No focal deficit present.     Mental Status: He is alert and oriented to person, place, and time.     Cranial Nerves: No cranial nerve deficit.  Psychiatric:        Mood and Affect: Mood normal.        Behavior: Behavior normal.        Thought Content: Thought content normal.        Judgment: Judgment normal.         Assessment And Plan:     1. Encounter  for general adult medical examination w/o abnormal findings . Behavior modifications discussed and diet history reviewed.   . Pt will continue to exercise regularly and modify diet with low GI, plant based foods and decrease intake of processed foods.  . Recommend intake of daily multivitamin, Vitamin D, and calcium.  . Recommend colonoscopy for preventive screenings, as well as recommend immunizations that include TDAP - CBC no Diff  2. Essential hypertension B/P is well controlled.  CMP ordered to check renal function.  continue regular exercise and dietary modification with low salt  EKG done with NSR - POCT Urinalysis Dipstick (81002) - POCT UA - Microalbumin - EKG 12-Lead  3. Type 2 diabetes mellitus without complication, without long-term current use of insulin (HCC)  Chronic, stable  Continue with metformin, tolerating well - Lipid panel - CMP14+EGFR - CBC no Diff - Hemoglobin A1c  4. Hip pain  Reoccurring right hip pain  Would like a toradol and kenalog injection today since has been increasing in intensity  He was also given diclofenac gel - ketorolac (TORADOL) 30 MG/ML injection 30  mg - triamcinolone acetonide (KENALOG-40) injection 40 mg - diclofenac Sodium (VOLTAREN) 1 % GEL; Apply 2 g topically 4 (four) times daily.  Dispense: 100 g; Refill: 2    [name], FNP    THE PATIENT IS ENCOURAGED TO PRACTICE SOCIAL DISTANCING DUE TO THE COVID-19 PANDEMIC.

## 2019-07-08 NOTE — Patient Instructions (Signed)
Health Maintenance After Age 73 After age 73, you are at a higher risk for certain long-term diseases and infections as well as injuries from falls. Falls are a major cause of broken bones and head injuries in people who are older than age 73. Getting regular preventive care can help to keep you healthy and well. Preventive care includes getting regular testing and making lifestyle changes as recommended by your health care provider. Talk with your health care provider about:  Which screenings and tests you should have. A screening is a test that checks for a disease when you have no symptoms.  A diet and exercise plan that is right for you. What should I know about screenings and tests to prevent falls? Screening and testing are the best ways to find a health problem early. Early diagnosis and treatment give you the best chance of managing medical conditions that are common after age 73. Certain conditions and lifestyle choices may make you more likely to have a fall. Your health care provider may recommend:  Regular vision checks. Poor vision and conditions such as cataracts can make you more likely to have a fall. If you wear glasses, make sure to get your prescription updated if your vision changes.  Medicine review. Work with your health care provider to regularly review all of the medicines you are taking, including over-the-counter medicines. Ask your health care provider about any side effects that may make you more likely to have a fall. Tell your health care provider if any medicines that you take make you feel dizzy or sleepy.  Osteoporosis screening. Osteoporosis is a condition that causes the bones to get weaker. This can make the bones weak and cause them to break more easily.  Blood pressure screening. Blood pressure changes and medicines to control blood pressure can make you feel dizzy.  Strength and balance checks. Your health care provider may recommend certain tests to check your  strength and balance while standing, walking, or changing positions.  Foot health exam. Foot pain and numbness, as well as not wearing proper footwear, can make you more likely to have a fall.  Depression screening. You may be more likely to have a fall if you have a fear of falling, feel emotionally low, or feel unable to do activities that you used to do.  Alcohol use screening. Using too much alcohol can affect your balance and may make you more likely to have a fall. What actions can I take to lower my risk of falls? General instructions  Talk with your health care provider about your risks for falling. Tell your health care provider if: ? You fall. Be sure to tell your health care provider about all falls, even ones that seem minor. ? You feel dizzy, sleepy, or off-balance.  Take over-the-counter and prescription medicines only as told by your health care provider. These include any supplements.  Eat a healthy diet and maintain a healthy weight. A healthy diet includes low-fat dairy products, low-fat (lean) meats, and fiber from whole grains, beans, and lots of fruits and vegetables. Home safety  Remove any tripping hazards, such as rugs, cords, and clutter.  Install safety equipment such as grab bars in bathrooms and safety rails on stairs.  Keep rooms and walkways well-lit. Activity   Follow a regular exercise program to stay fit. This will help you maintain your balance. Ask your health care provider what types of exercise are appropriate for you.  If you need a cane or   walker, use it as recommended by your health care provider.  Wear supportive shoes that have nonskid soles. Lifestyle  Do not drink alcohol if your health care provider tells you not to drink.  If you drink alcohol, limit how much you have: ? 0-1 drink a day for women. ? 0-2 drinks a day for men.  Be aware of how much alcohol is in your drink. In the [place] one drink equals one typical bottle of beer (12  oz), one-half glass of wine (5 oz), or one shot of hard liquor (1 oz).  Do not use any products that contain nicotine or tobacco, such as cigarettes and e-cigarettes. If you need help quitting, ask your health care provider. Summary  Having a healthy lifestyle and getting preventive care can help to protect your health and wellness after age 73.  Screening and testing are the best way to find a health problem early and help you avoid having a fall. Early diagnosis and treatment give you the best chance for managing medical conditions that are more common for people who are older than age 73.  Falls are a major cause of broken bones and head injuries in people who are older than age 73. Take precautions to prevent a fall at home.  Work with your health care provider to learn what changes you can make to improve your health and wellness and to prevent falls. This information is not intended to replace advice given to you by your health care provider. Make sure you discuss any questions you have with your health care provider. Document Revised: 04/23/2018 Document Reviewed: 11/13/2016 Elsevier Patient Education  2020 [employer]  

## 2019-07-08 NOTE — Progress Notes (Signed)
This visit occurred during the SARS-CoV-2 public health emergency.  Safety protocols were in place, including screening questions prior to the visit, additional usage of staff PPE, and extensive cleaning of exam room while observing appropriate contact time as indicated for disinfecting solutions.  Subjective:   Ricky Shields is a 74 y.o. male who presents for Medicare Annual/Subsequent preventive examination.  Review of Systems    n/a Cardiac Risk Factors include: advanced age (>40men, >47 women);diabetes mellitus;hypertension;male gender     Objective:    Today's Vitals   07/08/19 1124  BP: 128/80  Pulse: 95  Temp: 98.4 F (36.9 C)  TempSrc: Oral  SpO2: 98%  Weight: 163 lb 3.2 oz (74 kg)  Height: 5\' 7"  (1.702 m)   Body mass index is 25.56 kg/m.  Advanced Directives 07/08/2019 06/25/2018 01/27/2016 10/21/2014  Does Patient Have a Medical Advance Directive? Yes No No No  Type of Paramedic of [place];Living will - - -  Copy of [place] in Chart? No - copy requested - - -  Would patient like information on creating a medical advance directive? - No - Patient declined - No - patient declined information    Current Medications (verified) Outpatient Encounter Medications as of 07/08/2019  Medication Sig  . cholecalciferol (VITAMIN D) 1000 UNITS tablet Take 1,000 Units by mouth daily.  . metFORMIN (GLUCOPHAGE) 500 MG tablet TAKE 2 TABLETS BY MOUTH TWICE DAILY WITH THE MORNING AND EVENING MEAL  . Multiple Vitamin (MULTIVITAMIN WITH MINERALS) TABS tablet Take 1 tablet by mouth daily.  . Olmesartan-amLODIPine-HCTZ 40-10-12.5 MG TABS TAKE 1 TABLET BY MOUTH DAILY  . ONE TOUCH ULTRA TEST test strip   . simvastatin (ZOCOR) 40 MG tablet TAKE 1 TABLET(40 MG) BY MOUTH EVERY EVENING  . tamsulosin (FLOMAX) 0.4 MG CAPS capsule Take 0.4 mg by mouth daily.  . mometasone (NASONEX) 50 MCG/ACT nasal spray Place 2 sprays into the nose daily.   No  facility-administered encounter medications on file as of 07/08/2019.    Allergies (verified) Penicillins   History: Past Medical History:  Diagnosis Date  . Back pain    intermittent-prior military injury  . BPH (benign prostatic hypertrophy) with urinary retention   . Diabetes mellitus without complication ([place])   . GERD (gastroesophageal reflux disease)   . High cholesterol   . Hypertension    Past Surgical History:  Procedure Laterality Date  . COLONOSCOPY    . GREEN LIGHT LASER TURP (TRANSURETHRAL RESECTION OF PROSTATE N/A 10/21/2014   Procedure: GREEN LIGHT LASER TURP (TRANSURETHRAL RESECTION OF PROSTATE;  Surgeon: Festus Aloe, MD;  Location: [facility];  Service: Urology;  Laterality: N/A;   Family History  Problem Relation Age of Onset  . Stroke Mother   . Stroke Father    Social History   Socioeconomic History  . Marital status: Married    Spouse name: Not on file  . Number of children: Not on file  . Years of education: Not on file  . Highest education level: Not on file  Occupational History  . Occupation: retired  Tobacco Use  . Smoking status: Never Smoker  . Smokeless tobacco: Never Used  Vaping Use  . Vaping Use: Never used  Substance and Sexual Activity  . Alcohol use: No    Alcohol/week: 0.0 standard drinks  . Drug use: No  . Sexual activity: Yes  Other Topics Concern  . Not on file  Social History Narrative  . Not on file  Social Determinants of Health   Financial Resource Strain: Low Risk   . Difficulty of Paying Living Expenses: Not hard at all  Food Insecurity: No Food Insecurity  . Worried About Charity fundraiser in the Last Year: Never true  . Ran Out of Food in the Last Year: Never true  Transportation Needs: No Transportation Needs  . Lack of Transportation (Medical): No  . Lack of Transportation (Non-Medical): No  Physical Activity: Sufficiently Active  . Days of Exercise per Week: 3 days  . Minutes of  Exercise per Session: 60 min  Stress: No Stress Concern Present  . Feeling of Stress : Not at all  Social Connections:   . Frequency of Communication with Friends and Family:   . Frequency of Social Gatherings with Friends and Family:   . Attends Religious Services:   . Active Member of Clubs or Organizations:   . Attends Archivist Meetings:   Marland Kitchen Marital Status:     Tobacco Counseling Counseling given: Not Answered   Clinical Intake:  Pre-visit preparation completed: Yes  Pain : No/denies pain     Nutritional Status: BMI 25 -29 Overweight Nutritional Risks: None Diabetes: Yes CBG done?: No Did pt. bring in CBG monitor from home?: No  How often do you need to have someone help you when you read instructions, pamphlets, or other written materials from your doctor or pharmacy?: 1 - Never What is the last grade level you completed in school?: 12th grade  Diabetic? Yes Nutrition Risk Assessment:  Has the patient had any N/V/D within the last 2 months?  No  Does the patient have any non-healing wounds?  No  Has the patient had any unintentional weight loss or weight gain?  No   Diabetes:  Is the patient diabetic?  Yes  If diabetic, was a CBG obtained today?  Yes  Did the patient bring in their glucometer from home?  No  How often do you monitor your CBG's? 2-3 times weekly.   Financial Strains and Diabetes Management:  Are you having any financial strains with the device, your supplies or your medication? No .  Does the patient want to be seen by Chronic Care Management for management of their diabetes?  No  Would the patient like to be referred to a Nutritionist or for Diabetic Management?  No   Diabetic Exams:  Diabetic Eye Exam: Scheduled for December Diabetic Foot Exam: Overdue, Pt has been advised about the importance in completing this exam. Pt is scheduled for diabetic foot exam on today.   Interpreter Needed?: No  Information entered by ::  NAllen LPN   Activities of Daily Living In your present state of health, do you have any difficulty performing the following activities: 07/08/2019 07/07/2019  Hearing? N N  Vision? N N  Difficulty concentrating or making decisions? N N  Walking or climbing stairs? N N  Dressing or bathing? N N  Doing errands, shopping? N N  Preparing Food and eating ? N -  Using the Toilet? N -  In the past six months, have you accidently leaked urine? N -  Do you have problems with loss of bowel control? N -  Managing your Medications? N -  Managing your Finances? N -  Housekeeping or managing your Housekeeping? N -  Some recent data might be hidden    Patient Care Team: [name], FNP as PCP - General (General Practice)  Indicate any recent Medical Services you may have  received from other than [facility] providers in the past year (date may be approximate).     Assessment:   This is a routine wellness examination for [place].  Hearing/Vision screen  Hearing Screening   125Hz  250Hz  500Hz  1000Hz  2000Hz  3000Hz  4000Hz  6000Hz  8000Hz   Right ear:           Left ear:           Vision Screening Comments: Regular eye exams. [facility]  Dietary issues and exercise activities discussed: Current Exercise Habits: Home exercise routine, Type of exercise: walking;strength training/weights, Time (Minutes): 60, Frequency (Times/Week): 3, Weekly Exercise (Minutes/Week): 180  Goals    . Patient Stated     Wants get get rid of diabetes    . Patient Stated     07/08/2019, wants to stay healthy eat more fruits and vegetables      Depression Screen PHQ 2/9 Scores 07/08/2019 09/30/2018 06/25/2018 01/19/2018 12/19/2017 09/03/2014  PHQ - 2 Score 0 0 0 0 0 0  PHQ- 9 Score 0 - 0 - - -    Fall Risk Fall Risk  07/08/2019 09/30/2018 06/25/2018 01/19/2018 12/19/2017  Falls in the past year? 0 0 0 0 0  Risk for fall due to : Medication side effect - Medication side effect - -  Follow up Falls evaluation  completed;Education provided;Falls prevention discussed - Falls prevention discussed;Education provided - -    Any stairs in or around the home? Yes  If so, are there any without handrails? Yes  Home free of loose throw rugs in walkways, pet beds, electrical cords, etc? Yes  Adequate lighting in your home to reduce risk of falls? Yes   ASSISTIVE DEVICES UTILIZED TO PREVENT FALLS:  Life alert? No  Use of a cane, walker or w/c? No  Grab bars in the bathroom? No  Shower chair or bench in shower? No  Elevated toilet seat or a handicapped toilet? No   TIMED UP AND GO:  Was the test performed? No .  .   Gait steady and fast without use of assistive device  Cognitive Function:     6CIT Screen 07/08/2019 06/25/2018  What Year? 0 points 0 points  What month? 0 points 0 points  What time? 0 points 0 points  Count back from 20 0 points 0 points  Months in reverse 0 points 0 points  Repeat phrase 4 points 2 points  Total Score 4 2    Immunizations Immunization History  Administered Date(s) Administered  . Fluad Quad(high Dose 65+) 09/30/2018  . Influenza, High Dose Seasonal PF 10/10/2017  . Influenza-Unspecified 11/10/2017  . PFIZER SARS-COV-2 Vaccination 02/19/2019, 03/12/2019  . Pneumococcal Conjugate-13 09/30/2018  . Pneumococcal-Unspecified 11/02/2012, 02/09/2014  . Tdap 11/02/2012    TDAP status: Up to date Flu Vaccine status: Up to date Pneumococcal vaccine status: Up to date Covid-19 vaccine status: Completed vaccines  Qualifies for Shingles Vaccine? Yes   Zostavax completed Yes   Shingrix Completed?: No.    Education has been provided regarding the importance of this vaccine. Patient has been advised to call insurance company to determine out of pocket expense if they have not yet received this vaccine. Advised may also receive vaccine at local pharmacy or Health Dept. Verbalized acceptance and understanding.  Screening Tests Health Maintenance  Topic Date Due  .  FOOT EXAM  06/25/2019  . HEMOGLOBIN A1C  06/30/2019  . INFLUENZA VACCINE  08/15/2019  . OPHTHALMOLOGY EXAM  12/31/2019  . TETANUS/TDAP  11/03/2022  .  COLONOSCOPY  03/05/2024  . COVID-19 Vaccine  Completed  . Hepatitis C Screening  Completed  . PNA vac Low Risk Adult  Completed    Health Maintenance  Health Maintenance Due  Topic Date Due  . FOOT EXAM  06/25/2019  . HEMOGLOBIN A1C  06/30/2019    Colorectal cancer screening: Completed 03/05/2014. Repeat every 10 years  Lung Cancer Screening: (Low Dose CT Chest recommended if Age 100-80 years, 30 pack-year currently smoking OR have quit w/in 15years.) does not qualify.   Lung Cancer Screening Referral: no  Additional Screening:  Hepatitis C Screening: does qualify; Completed 06/02/2016  Vision Screening: Recommended annual ophthalmology exams for early detection of glaucoma and other disorders of the eye. Is the patient up to date with their annual eye exam?  Yes  Who is the provider or what is the name of the office in which the patient attends annual eye exams? Dr. Venetia Maxon If pt is not established with a provider, would they like to be referred to a provider to establish care? No .   Dental Screening: Recommended annual dental exams for proper oral hygiene  Community Resource Referral / Chronic Care Management: CRR required this visit?  No   CCM required this visit?  No      Plan:     I have personally reviewed and noted the following in the patient's chart:   . Medical and social history . Use of alcohol, tobacco or illicit drugs  . Current medications and supplements . Functional ability and status . Nutritional status . Physical activity . Advanced directives . List of other physicians . Hospitalizations, surgeries, and ER visits in previous 12 months . Vitals . Screenings to include cognitive, depression, and falls . Referrals and appointments  In addition, I have reviewed and discussed with patient  certain preventive protocols, quality metrics, and best practice recommendations. A written personalized care plan for preventive services as well as general preventive health recommendations were provided to patient.     Kellie Simmering, LPN   [id number]   Nurse Notes: n/a

## 2019-07-09 ENCOUNTER — Other Ambulatory Visit: Payer: Self-pay | Admitting: Internal Medicine

## 2019-07-09 LAB — CMP14+EGFR
ALT: 21 IU/L (ref 0–44)
AST: 22 IU/L (ref 0–40)
Albumin/Globulin Ratio: 1.6 (ref 1.2–2.2)
Albumin: 4.7 g/dL (ref 3.7–4.7)
Alkaline Phosphatase: 81 IU/L (ref 48–121)
BUN/Creatinine Ratio: 11 (ref 10–24)
BUN: 11 mg/dL (ref 8–27)
Bilirubin Total: 0.4 mg/dL (ref 0.0–1.2)
CO2: 23 mmol/L (ref 20–29)
Calcium: 10.3 mg/dL — ABNORMAL HIGH (ref 8.6–10.2)
Chloride: 99 mmol/L (ref 96–106)
Creatinine, Ser: 1.03 mg/dL (ref 0.76–1.27)
GFR calc Af Amer: 83 mL/min/{1.73_m2} (ref >59)
GFR calc non Af Amer: 72 mL/min/{1.73_m2} (ref >59)
Globulin, Total: 2.9 g/dL (ref 1.5–4.5)
Glucose: 151 mg/dL — ABNORMAL HIGH (ref 65–99)
Potassium: 3.7 mmol/L (ref 3.5–5.2)
Sodium: 138 mmol/L (ref 134–144)
Total Protein: 7.6 g/dL (ref 6.0–8.5)

## 2019-07-09 LAB — LIPID PANEL
Chol/HDL Ratio: 3 ratio (ref 0.0–5.0)
Cholesterol, Total: 141 mg/dL (ref 100–199)
HDL: 47 mg/dL (ref >39)
LDL Chol Calc (NIH): 75 mg/dL (ref 0–99)
Triglycerides: 106 mg/dL (ref 0–149)
VLDL Cholesterol Cal: 19 mg/dL (ref 5–40)

## 2019-07-09 LAB — HEMOGLOBIN A1C
Est. average glucose Bld gHb Est-mCnc: 151 mg/dL
Hgb A1c MFr Bld: 6.9 % — ABNORMAL HIGH (ref 4.8–5.6)

## 2019-07-09 LAB — CBC
Hematocrit: 39.8 % (ref 37.5–51.0)
Hemoglobin: 14.3 g/dL (ref 13.0–17.7)
MCH: 34.1 pg — ABNORMAL HIGH (ref 26.6–33.0)
MCHC: 35.9 g/dL — ABNORMAL HIGH (ref 31.5–35.7)
MCV: 95 fL (ref 79–97)
Platelets: 250 10*3/uL (ref 150–450)
RBC: 4.19 x10E6/uL (ref 4.14–5.80)
RDW: 11.8 % (ref 11.6–15.4)
WBC: 6.4 10*3/uL (ref 3.4–10.8)

## 2019-08-26 ENCOUNTER — Other Ambulatory Visit: Payer: Self-pay | Admitting: Nurse Practitioner

## 2019-08-27 ENCOUNTER — Other Ambulatory Visit: Payer: Self-pay | Admitting: Nurse Practitioner

## 2019-09-04 DIAGNOSIS — Z1152 Encounter for screening for COVID-19: Secondary | ICD-10-CM | POA: Diagnosis not present

## 2019-09-29 ENCOUNTER — Telehealth: Payer: Self-pay | Admitting: *Deleted

## 2019-09-29 NOTE — Chronic Care Management (AMB) (Signed)
  Chronic Care Management   Note  09/29/2019 Name: Ricky Shields MRN: [id number] DOB: 1945-08-28  Ricky Shields is a 74 y.o. year old male who is a primary care patient of [name], [place]. I reached out to [name] by phone today in response to a referral sent by Ricky Shields's health plan.     [name] was given information about Chronic Care Management services today including:  1. CCM service includes personalized support from designated clinical staff supervised by his physician, including individualized plan of care and coordination with other care providers 2. 24/7 contact phone numbers for assistance for urgent and routine care needs. 3. Service will only be billed when office clinical staff spend 20 minutes or more in a month to coordinate care. 4. Only one practitioner may furnish and bill the service in a calendar month. 5. The patient may stop CCM services at any time (effective at the end of the month) by phone call to the office staff. 6. The patient will be responsible for cost sharing (co-pay) of up to 20% of the service fee (after annual deductible is met).  Patient agreed to services and verbal consent obtained.   Follow up plan: Telephone appointment with care management team member scheduled for: 10/07/2019  [facility]  [place], [place] [address] Direct Dial: [email or web address] Website: [email or web address]

## 2019-09-29 NOTE — Chronic Care Management (AMB) (Signed)
°  Chronic Care Management   Outreach Note  09/29/2019 Name: Ricky Shields MRN: [id number] DOB: 06/13/1945  Ricky Shields is a 74 y.o. year old male who is a primary care patient of [name], [place]. I reached out to [name] by phone today in response to a referral sent by Ricky Shields's health plan.     An unsuccessful telephone outreach was attempted today. The patient was referred to the case management team for assistance with care management and care coordination.   Follow Up Plan: A HIPAA compliant phone message was left for the patient providing contact information and requesting a return call. The care management team will reach out to the patient again over the next 7 days. If patient returns call to provider office, please advise to call [place] at [phone].  [facility]

## 2019-10-04 ENCOUNTER — Other Ambulatory Visit: Payer: Self-pay | Admitting: Nurse Practitioner

## 2019-10-05 ENCOUNTER — Telehealth: Payer: Self-pay

## 2019-10-05 NOTE — Chronic Care Management (AMB) (Signed)
I    Chronic Care Management Pharmacy Assistant   Name: HASSANI SLINEY  MRN: [id number] DOB: 1945-04-18  Reason for Encounter: Medication Review - Initial Questions for Pharmacists.  Patient Questions:   Have you seen any other providers since your last visit? 09/04/2019 [facility]  Any changes in your medications or health? Yes, Patient is  having some swelling in feet, for about two weeks.  Any side effects from any medications? No  Do you have an symptoms or problems not managed by your medications? no  Any concerns about your health right now? Patient  having swelling in feet started two weeks ago and continues to have pain in lower back, hips and legs  Has your provider asked that you check blood pressure, blood sugar, or follow special diet at home? Patient checks  blood sugars , blood pressures twice a week.  Do you get any type of exercise on a regular basis? Patient does weight lifting and stationary bike twice a week.  Can you think of a goal you would like to reach for your health? Patient would like to be pain free and be able to walk more.     Do you have any problems getting your medications? No , Patient goes to [facility] .  Is there anything that you would like to discuss during the appointment?  Patient would like to be healthier and just to feel better .   Patient is aware to have medications and supplements available at time of phone visit.   PCP : [name], FNP  Allergies:   Allergies  Allergen Reactions   Penicillins Rash    Has patient had a PCN reaction causing immediate rash, facial/tongue/throat swelling, SOB or lightheadedness with hypotension: No Has patient had a PCN reaction causing severe rash involving mucus membranes or skin necrosis: No Has patient had a PCN reaction that required hospitalization No Has patient had a PCN reaction occurring within the last 10 years: No If all of the above  answers are "NO", then may proceed with Cephalosporin use.     Medications: Outpatient Encounter Medications as of 10/05/2019  Medication Sig   cholecalciferol (VITAMIN D) 1000 UNITS tablet Take 1,000 Units by mouth daily.   diclofenac Sodium (VOLTAREN) 1 % GEL Apply 2 g topically 4 (four) times daily.   metFORMIN (GLUCOPHAGE) 500 MG tablet TAKE 2 TABLETS BY MOUTH TWICE DAILY WITH THE MORNING AND EVENING MEAL   mometasone (NASONEX) 50 MCG/ACT nasal spray Place 2 sprays into the nose daily.   Multiple Vitamin (MULTIVITAMIN WITH MINERALS) TABS tablet Take 1 tablet by mouth daily.   Olmesartan-amLODIPine-HCTZ 40-10-12.5 MG TABS TAKE 1 TABLET BY MOUTH DAILY   ONE TOUCH ULTRA TEST test strip    simvastatin (ZOCOR) 40 MG tablet TAKE 1 TABLET(40 MG) BY MOUTH EVERY EVENING   tamsulosin (FLOMAX) 0.4 MG CAPS capsule Take 0.4 mg by mouth daily.   No facility-administered encounter medications on file as of 10/05/2019.    Current Diagnosis: Patient Active Problem List   Diagnosis Date Noted   Tingling of skin 03/20/2018   Type 2 diabetes mellitus without complication, without long-term current use of insulin ([place]) 12/19/2017   Essential hypertension 12/19/2017    Follow-Up:  Pharmacist Review  Jannette Fogo, CPP  Judithann Sheen, [place] Pharmacist Assistant [phone]

## 2019-10-07 ENCOUNTER — Other Ambulatory Visit: Payer: Self-pay

## 2019-10-07 ENCOUNTER — Ambulatory Visit: Payer: PPO

## 2019-10-07 DIAGNOSIS — E119 Type 2 diabetes mellitus without complications: Secondary | ICD-10-CM

## 2019-10-07 DIAGNOSIS — I10 Essential (primary) hypertension: Secondary | ICD-10-CM

## 2019-10-07 NOTE — Chronic Care Management (AMB) (Signed)
Chronic Care Management Pharmacy  Name: Ricky Shields  MRN: [id number] DOB: 1945/11/06  Chief Complaint/ HPI  Ricky Shields,  74 y.o. , male presents for their Initial CCM visit with the clinical pharmacist via telephone due to COVID-19 Pandemic.  PCP : [name], FNP  Their chronic conditions include: Hypertension, Hyperlipidemia and Diabetes  Office Visits: 07/08/19 AWV and OV: Annual exam. HTN/ DM follow up. BP well controlled. Continue regular exercise and low salt diet. EKG done with NSR. DM stable, continue metformin (tolerating well). Pt complains of reoccurring right hip pain. Toradol and Kenalog injection administered in office. Given prescription for diclofenac gel. Pt sees [facility]. Kidney function and blood levels normal. HgbA1c up to 6.9%. Increase fiber intake and exercise. Avoid sugary foods and drinks. Cholesterol normal.   12/30/18 OV: DM/HTN follow up. BP well controlled. HgbA1c up to 6.8%. Increase physical activity to at least 150 minutes per week. Kidney and liver function normal. Cholesterol levels are normal. Firm mass of left arm which appeared after last blood draw in August/September. Advised to apply warm compress twice daily. Consider ultrasound.   Consult Visits: 09/04/19 COVID test negative  12/31/18 Ophthalmology OV w. Dr. Venetia Maxon: Note unavailable  CCM Encounters:  Medications: Outpatient Encounter Medications as of 10/07/2019  Medication Sig   aspirin EC 81 MG tablet Take 81 mg by mouth daily. Swallow whole.   Cholecalciferol (VITAMIN D) 50 MCG (2000 UT) tablet Take 2,000 Units by mouth daily.    Multiple Vitamin (MULTIVITAMIN WITH MINERALS) TABS tablet Take 1 tablet by mouth daily. One a Day Men's   Multiple Vitamins-Minerals (OCUVITE PRESERVISION PO) Take 1 tablet by mouth daily.   Olmesartan-amLODIPine-HCTZ 40-10-12.5 MG TABS TAKE 1 TABLET BY MOUTH DAILY   ONE TOUCH ULTRA TEST test strip    simvastatin (ZOCOR) 40 MG tablet  TAKE 1 TABLET(40 MG) BY MOUTH EVERY EVENING   tamsulosin (FLOMAX) 0.4 MG CAPS capsule Take 0.4 mg by mouth daily.   valACYclovir (VALTREX) 500 MG tablet Take 500 mg by mouth daily.   [DISCONTINUED] metFORMIN (GLUCOPHAGE) 500 MG tablet TAKE 2 TABLETS BY MOUTH TWICE DAILY WITH THE MORNING AND EVENING MEAL   diclofenac Sodium (VOLTAREN) 1 % GEL Apply 2 g topically 4 (four) times daily. (Patient not taking: Reported on 10/07/2019)   mometasone (NASONEX) 50 MCG/ACT nasal spray Place 2 sprays into the nose daily.   No facility-administered encounter medications on file as of 10/07/2019.    Current Diagnosis/Assessment:  SDOH Interventions     Most Recent Value  SDOH Interventions  Financial Strain Interventions Intervention Not Indicated      Goals Addressed            This Visit's Progress    Pharmacy Care Plan       CARE PLAN ENTRY (see longitudinal plan of care for additional care plan information)  Current Barriers:   Chronic Disease Management support, education, and care coordination needs related to Hypertension, Hyperlipidemia, and Diabetes   Hypertension BP Readings from Last 3 Encounters:  10/14/19 140/78  07/08/19 128/80  07/08/19 128/80    Pharmacist Clinical Goal(s): o Over the next 90 days, patient will work with PharmD and providers to maintain BP goal <130/80  Current regimen:  o Olmesartan/Amlodipine/HCTZ 40/10/12.66m daily  Interventions: o Provided dietary and exercise recommendations o Discussed appropriate goal for blood pressure (less than 130/80) o Patient complained of swelling - Recommended patient elevate legs - Consider compression stockings - Could consider increasing diuretic if patient  is retaining fluid - Advised patient to discuss with PCP at office visit next week  Patient self care activities - Over the next 90 days, patient will: o Check BP once-twice weekly, document, and provide at future appointments o Ensure daily salt  intake < 2300 mg/day o Elevate legs to help with swelling and consider using compression stockings o Discuss swelling with PCP at office visit next week o Exercise for 30 minutes daily 5 times per week (150 minutes total)  Hyperlipidemia Lab Results  Component Value Date/Time   LDLCALC 75 07/08/2019 03:38 PM    Pharmacist Clinical Goal(s): o Over the next 90 days, patient will work with PharmD and providers to achieve LDL goal < 70  Current regimen:  o Simvastatin 10m daily every evening  Interventions: o Provided dietary and exercise recommendations o Discussed appropriate goals for LDL (less than 70), HDL (greater than 40), and triglycerides (less than 150)  Patient self care activities - Over the next 90 days, patient will: o Exercise for 30 minutes daily 5 times per week (150 minutes total)  Diabetes Lab Results  Component Value Date/Time   HGBA1C 6.9 (H) 07/08/2019 02:07 PM   HGBA1C 6.8 (H) 12/30/2018 02:17 PM    Pharmacist Clinical Goal(s): o Over the next 180 days, patient will work with PharmD and providers to maintain A1c goal <7%  Current regimen:  o Metformin 5082m2 tablets twice daily with morning and evening meal  Interventions: o Provided dietary and exercise recommendations o Discussed appropriate goal for fasting blood sugar (80-130) o Discussed hypoglycemia treatment  Patient self care activities - Over the next 180 days, patient will: o Check blood sugar twice weekly, document, and provide at future appointments o Contact provider with any episodes of hypoglycemia o Exercise for 30 minutes daily 5 times per week (150 minutes total)  Medication management  Pharmacist Clinical Goal(s): o Over the next 90 days, patient will work with PharmD and providers to maintain optimal medication adherence  Current pharmacy: [name]  Interventions o Comprehensive medication review performed. o Continue current medication management strategy  Patient  self care activities - Over the next 90 days, patient will: o Focus on medication adherence by utilizing pill box o Take medications as prescribed o Report any questions or concerns to PharmD and/or provider(s)  Initial goal documentation        Diabetes   A1c goal <7%  Recent Relevant Labs: Lab Results  Component Value Date/Time   HGBA1C 6.9 (H) 07/08/2019 02:07 PM   HGBA1C 6.8 (H) 12/30/2018 02:17 PM   MICROALBUR 30 07/08/2019 12:51 PM   MICROALBUR 80 06/25/2018 12:39 PM    Last diabetic Eye exam:  Lab Results  Component Value Date/Time   HMDIABEYEEXA No Retinopathy 12/31/2018 12:00 AM    Last diabetic Foot exam: 07/08/19  Checking BG: twice weekly  Recent FBG Readings: 138 Recent pre-meal BG readings:  Recent 2hr PP BG readings:   Recent HS BG readings:   Patient has failed these meds in past: N/A Patient is currently controlled on the following medications:  Metformin 5006m tablets twice daily with morning and evening meal  We discussed:   Diet extensively o Pt says that he is a breakfast person o Breakfast: Cereal sometimes with banana, steak, egg, and cheese biscuit o Lunch: [place] cheese sandwich, banana and peanut butter crackers o Dinner: Beef hotdogs, hamburger, fish, okra, green beans o Pt states he does not eat pork at all o Pt drinks water  all day (about 64 ounces of water daily) - No sodas, occasionally sweet tea o Pt loves fruit o PLATE method  Exercise extensively o Pt works out in home since [place] o Weight lifting with dumbbells 2-3 times weekly about 30 minutes each time o Treadmill and stationary bike 2-3 times weekly for 30-45 minutes o Walks outside some when he feels good o Recommend pt get 30 minutes of moderate intensity exercise daily 5 times a week (150 minutes total per week)  Denies side effects with metformin  FBG goal 80-130  Pt states he has had a few instances of low blood sugar o No symptoms of hypoglycemia aside  from feeling like he needed to eat something  Hypoglycemia treatment (BG <70)  Plan Continue current medications   Hyperlipidemia   LDL goal < 70  Lipid Panel     Component Value Date/Time   CHOL 141 07/08/2019 1538   TRIG 106 07/08/2019 1538   HDL 47 07/08/2019 1538   LDLCALC 75 07/08/2019 1538    Hepatic Function Latest Ref Rng & Units 07/08/2019 12/30/2018 09/30/2018  Total Protein 6.0 - 8.5 g/dL 7.6 7.2 7.0  Albumin 3.7 - 4.7 g/dL 4.7 4.6 4.4  AST 0 - 40 IU/L '22 22 22  ' ALT 0 - 44 IU/L '21 20 21  ' Alk Phosphatase 48 - 121 IU/L 81 85 67  Total Bilirubin 0.0 - 1.2 mg/dL 0.4 0.5 0.6     The 10-year ASCVD risk score Mikey Bussing DC Jr., et al., 2013) is: 24.9%   Values used to calculate the score:     Age: 55 years     Sex: Male     Is Non-Hispanic African American: Yes     Diabetic: Yes     Tobacco smoker: No     Systolic Blood Pressure: 412 mmHg     Is BP treated: No     HDL Cholesterol: 47 mg/dL     Total Cholesterol: 141 mg/dL   Patient has failed these meds in past: N/A Patient is currently controlled on the following medications:   Simvastatin 45m daily every evening  We discussed:    Goals for LDL, HDL, triglycerides Diet and exercise extensively Takes simvastatin in the evening  Plan Continue current medications   Hypertension   BP goal is:  <130/80  Office blood pressures are  BP Readings from Last 3 Encounters:  10/14/19 140/78  07/08/19 128/80  07/08/19 128/80   Patient checks BP at home 1-2x per week Patient home BP readings are ranging: 138/80  Patient has failed these meds in the past: Valsartan/HCTZ Patient is currently controlled on the following medications:   Olmesartan/Amlodipine/HCTZ 40/10/12.545mdaily  We discussed:  Goal BP <130/80 Diet and exercise extensively Pt tries to limit salt (adds very little to food if he adds any) Pt stays away from canned foods and frozen dinners Takes BP medication in the morning Pt complains of  swelling in feet and legs, denies shortness of breath Discussed that HCTZ is a diuretic  Consider possibly increasing diuretic Advised pt to elevate legs to help with swelling (consider compression stockings)  Plan Continue current medications  Advised pt to discuss swelling with PCP at office visit next week  Benign Prostatic Hyperplasia   Patient has failed these meds in past: Uroxatral Patient is currently controlled on the following medications:   Tamsulosin 0.46m15maily  Plan Continue current medications  Health Maintenance   Patient is currently on the following medications:   Multivitamin  daily (One a Day Men's)  Cholecalciferol (19mg) 2000 units daily  Aspirin 869mdaily  Preservision daily  Valacyclovir 50077maily  We discussed:    Diclofenac can be good for joint pain  Plan Continue current medications   Vaccines   Reviewed and discussed patient's vaccination history.    Immunization History  Administered Date(s) Administered   Fluad Quad(high Dose 65+) 09/30/2018   Influenza, High Dose Seasonal PF 10/10/2017   Influenza-Unspecified 11/10/2017   PFIZER SARS-COV-2 Vaccination 02/19/2019, 03/12/2019   Pneumococcal Conjugate-13 09/30/2018   Pneumococcal-Unspecified 11/02/2012, 02/09/2014   Tdap 11/02/2012   Plan Review and discuss at follow up  Medication Management   Pt uses [facility] all medications Pt endorses 98% compliance  We discussed:   Importance of taking each medication daily as directed  Discussed indications for medications  Pt very seldom misses doses of medications  Plan Continue current medication management strategy  Discuss medication synchronization, adherence packaging, and delivery available with [employer] at follow up   Follow up: 2 month phone visit  CouJannette FogoharmD Clinical Pharmacist [facility] [phone]

## 2019-10-14 ENCOUNTER — Ambulatory Visit
Admission: RE | Admit: 2019-10-14 | Discharge: 2019-10-14 | Disposition: A | Discharge status: Home / Self Care | Payer: PPO | Source: Ambulatory Visit | Attending: Nurse Practitioner | Admitting: Nurse Practitioner

## 2019-10-14 ENCOUNTER — Other Ambulatory Visit: Payer: Self-pay | Admitting: Nurse Practitioner

## 2019-10-14 ENCOUNTER — Ambulatory Visit (INDEPENDENT_AMBULATORY_CARE_PROVIDER_SITE_OTHER): Payer: PPO | Admitting: Nurse Practitioner

## 2019-10-14 ENCOUNTER — Encounter: Payer: Self-pay | Admitting: Nurse Practitioner

## 2019-10-14 ENCOUNTER — Other Ambulatory Visit: Payer: Self-pay

## 2019-10-14 VITALS — BP 140/78 | HR 99 | Temp 98.1°F | Ht 67.0 in | Wt 160.0 lb

## 2019-10-14 DIAGNOSIS — R609 Edema, unspecified: Secondary | ICD-10-CM

## 2019-10-14 DIAGNOSIS — M25552 Pain in left hip: Secondary | ICD-10-CM | POA: Diagnosis not present

## 2019-10-14 DIAGNOSIS — M791 Myalgia, unspecified site: Secondary | ICD-10-CM

## 2019-10-14 DIAGNOSIS — I10 Essential (primary) hypertension: Secondary | ICD-10-CM

## 2019-10-14 DIAGNOSIS — M47817 Spondylosis without myelopathy or radiculopathy, lumbosacral region: Secondary | ICD-10-CM | POA: Diagnosis not present

## 2019-10-14 DIAGNOSIS — M25551 Pain in right hip: Secondary | ICD-10-CM | POA: Diagnosis not present

## 2019-10-14 DIAGNOSIS — R21 Rash and other nonspecific skin eruption: Secondary | ICD-10-CM

## 2019-10-14 DIAGNOSIS — E119 Type 2 diabetes mellitus without complications: Secondary | ICD-10-CM

## 2019-10-14 MED ORDER — KETOROLAC TROMETHAMINE 60 MG/2ML IM SOLN
60.0000 mg | Freq: Once | INTRAMUSCULAR | Status: AC
  Administered 2019-10-14: 60 mg via INTRAMUSCULAR

## 2019-10-14 MED ORDER — ACETAMINOPHEN 500 MG PO TABS: 500.0000 mg | ORAL_TABLET | Freq: Four times a day (QID) | ORAL | 0 refills | Status: DC | PRN

## 2019-10-14 MED ORDER — METFORMIN HCL 1000 MG PO TABS: 1000.0000 mg | ORAL_TABLET | Freq: Two times a day (BID) | ORAL | 1 refills | Status: DC

## 2019-10-14 NOTE — Progress Notes (Signed)
Rutherford Nail as a scribe for [name], FNP.,have documented all relevant documentation on the behalf of [name], FNP,as directed by  [name], FNP while in the presence of [name], [place].  This visit occurred during the SARS-CoV-2 public health emergency.  Safety protocols were in place, including screening questions prior to the visit, additional usage of staff PPE, and extensive cleaning of exam room while observing appropriate contact time as indicated for disinfecting solutions.  Subjective:     Patient ID: Ricky Shields , male    DOB: 04/17/1945 , 74 y.o.   MRN: [id number]   Chief Complaint  Patient presents with  . Hypertension  . Diabetes    HPI  Patient here for a blood pressure check and diabetes f/u.  Reports his blood pressure before taking his medications this morning was 170/80s.  Diabetes He presents for his follow-up diabetic visit. He has type 2 diabetes mellitus. His disease course has been stable. There are no hypoglycemic associated symptoms. Pertinent negatives for hypoglycemia include no dizziness or headaches. Pertinent negatives for diabetes include no blurred vision, no chest pain, no polydipsia, no polyphagia and no polyuria. There are no hypoglycemic complications. Symptoms are stable. There are no diabetic complications. Risk factors for coronary artery disease include male sex and sedentary lifestyle. Current diabetic treatment includes oral agent (monotherapy). He is compliant with treatment all of the time. His weight is stable. He is following a diabetic diet. When asked about meal planning, he reported none. He has not had a previous visit with a dietitian. He participates in exercise intermittently. (This morning blood sugar was 157 - ate grilled cheese and [place] sandwich and banana.) An ACE inhibitor/angiotensin II receptor blocker is being taken. He does not see a podiatrist.Eye exam is current (scheduled for tomorrow).   Hypertension This is a chronic problem. The current episode started more than 1 year ago. The problem is unchanged. The problem is controlled. Pertinent negatives include no anxiety, blurred vision, chest pain, headaches or palpitations. There are no associated agents to hypertension. Risk factors for coronary artery disease include sedentary lifestyle and male gender. Past treatments include angiotensin blockers and diuretics. The current treatment provides no improvement. There are no compliance problems.  There is no history of angina. There is no history of chronic renal disease.     Past Medical History:  Diagnosis Date  . Back pain    intermittent-prior military injury  . BPH (benign prostatic hypertrophy) with urinary retention   . Diabetes mellitus without complication ([place])   . GERD (gastroesophageal reflux disease)   . High cholesterol   . Hypertension      Family History  Problem Relation Age of Onset  . Stroke Mother   . Stroke Father      Current Outpatient Medications:  .  Cholecalciferol (VITAMIN D) 50 MCG (2000 UT) tablet, Take 2,000 Units by mouth daily. , Disp: , Rfl:  .  metFORMIN (GLUCOPHAGE) 1000 MG tablet, Take 1 tablet (1,000 mg total) by mouth 2 (two) times daily with a meal., Disp: 180 tablet, Rfl: 1 .  mometasone (NASONEX) 50 MCG/ACT nasal spray, Place 2 sprays into the nose daily., Disp: 17 g, Rfl: 2 .  Multiple Vitamin (MULTIVITAMIN WITH MINERALS) TABS tablet, Take 1 tablet by mouth daily. One a Day Men's, Disp: , Rfl:  .  Olmesartan-amLODIPine-HCTZ 40-10-12.5 MG TABS, TAKE 1 TABLET BY MOUTH DAILY, Disp: 90 tablet, Rfl: 0 .  ONE TOUCH ULTRA TEST test strip, ,  Disp: , Rfl: 0 .  simvastatin (ZOCOR) 40 MG tablet, TAKE 1 TABLET(40 MG) BY MOUTH EVERY EVENING, Disp: 90 tablet, Rfl: 0 .  tamsulosin (FLOMAX) 0.4 MG CAPS capsule, Take 0.4 mg by mouth daily., Disp: , Rfl: 5 .  acetaminophen (TYLENOL) 500 MG tablet, Take 1 tablet (500 mg total) by mouth every 6 (six)  hours as needed., Disp: 30 tablet, Rfl: 0 .  aspirin EC 81 MG tablet, Take 81 mg by mouth daily. Swallow whole., Disp: , Rfl:  .  diclofenac Sodium (VOLTAREN) 1 % GEL, Apply 2 g topically 4 (four) times daily. (Patient not taking: Reported on 10/07/2019), Disp: 100 g, Rfl: 2 .  Multiple Vitamins-Minerals (OCUVITE PRESERVISION PO), Take 1 tablet by mouth daily., Disp: , Rfl:  .  valACYclovir (VALTREX) 500 MG tablet, Take 500 mg by mouth daily., Disp: , Rfl:    Allergies  Allergen Reactions  . Penicillins Rash    Has patient had a PCN reaction causing immediate rash, facial/tongue/throat swelling, SOB or lightheadedness with hypotension: No Has patient had a PCN reaction causing severe rash involving mucus membranes or skin necrosis: No Has patient had a PCN reaction that required hospitalization No Has patient had a PCN reaction occurring within the last 10 years: No If all of the above answers are "NO", then may proceed with Cephalosporin use.      Review of Systems  Eyes: Negative for blurred vision.  Cardiovascular: Positive for leg swelling (has been having off and on swelling ). Negative for chest pain and palpitations.  Endocrine: Negative for polydipsia, polyphagia and polyuria.  Neurological: Negative for dizziness and headaches.     Today's Vitals   10/14/19 1038  BP: 140/78  Pulse: 99  Temp: 98.1 F (36.7 C)  TempSrc: Oral  Weight: 160 lb (72.6 kg)  Height: $Remove'5\' 7"'wvhVKrE$  (1.702 m)  PainSc: 6    Body mass index is 25.06 kg/m.   Objective:  Physical Exam Vitals reviewed.  Constitutional:      General: He is not in acute distress.    Appearance: Normal appearance.  Cardiovascular:     Rate and Rhythm: Normal rate and regular rhythm.     Pulses: Normal pulses.     Heart sounds: Normal heart sounds. No murmur heard.   Pulmonary:     Effort: Pulmonary effort is normal. No respiratory distress.     Breath sounds: Normal breath sounds.  Musculoskeletal:        General: No  swelling.     Right lower leg: No edema.     Left lower leg: No edema.  Skin:    General: Skin is warm and dry.     Capillary Refill: Capillary refill takes less than 2 seconds.  Neurological:     General: No focal deficit present.     Mental Status: He is alert and oriented to person, place, and time.  Psychiatric:        Mood and Affect: Mood normal.        Behavior: Behavior normal.        Thought Content: Thought content normal.        Judgment: Judgment normal.         Assessment And Plan:     1. Essential hypertension  Chronic, fair control  Continue with current medications - Lipid panel - CMP14+EGFR  2. Type 2 diabetes mellitus without complication, without long-term current use of insulin (HCC)  Chronic, stable  Continue with current medications and encouraged  to stay physically active of at least 150 minutes per week - metFORMIN (GLUCOPHAGE) 1000 MG tablet; Take 1 tablet (1,000 mg total) by mouth 2 (two) times daily with a meal.  Dispense: 180 tablet; Refill: 1 - Hemoglobin A1c  3. Swelling  No swelling noted to lower extremities  I have encouraged him to wear support socks when up during the day  4. Bilateral hip pain  Discussed with him to try to do low impact walking and can try a pain cream for the pain he is having in his hips.    Will check xrays to see if has arthritis or other structural damage  He can also take tylenol scheduled - DG Hip Unilat W OR W/O Pelvis Min 4 Views Right; Future - acetaminophen (TYLENOL) 500 MG tablet; Take 1 tablet (500 mg total) by mouth every 6 (six) hours as needed.  Dispense: 30 tablet; Refill: 0 - ketorolac (TORADOL) injection 60 mg  5. Localized skin eruption  - Ambulatory referral to Dermatology  6. Myalgia  He is taking a statin so will check muscle enzymes to ensure not caused by medications - CK, total - ketorolac (TORADOL) injection 60 mg     Patient was given opportunity to ask questions. Patient  verbalized understanding of the plan and was able to repeat key elements of the plan. All questions were answered to their satisfaction.   Teola Bradley, FNP, have reviewed all documentation for this visit. The documentation on 10/24/19 for the exam, diagnosis, procedures, and orders are all accurate and complete.  THE PATIENT IS ENCOURAGED TO PRACTICE SOCIAL DISTANCING DUE TO THE COVID-19 PANDEMIC.

## 2019-10-14 NOTE — Patient Instructions (Addendum)
Visit Information  Goals Addressed            This Visit's Progress   . Pharmacy Care Plan       CARE PLAN ENTRY (see longitudinal plan of care for additional care plan information)  Current Barriers:  . Chronic Disease Management support, education, and care coordination needs related to Hypertension, Hyperlipidemia, and Diabetes   Hypertension BP Readings from Last 3 Encounters:  10/14/19 140/78  07/08/19 128/80  07/08/19 128/80   . Pharmacist Clinical Goal(s): o Over the next 90 days, patient will work with PharmD and providers to maintain BP goal <130/80 . Current regimen:  o Olmesartan/Amlodipine/HCTZ 40/10/12.5mg  daily . Interventions: o Provided dietary and exercise recommendations o Discussed appropriate goal for blood pressure (less than 130/80) o Patient complained of swelling - Recommended patient elevate legs - Consider compression stockings - Could consider increasing diuretic if patient is retaining fluid - Advised patient to discuss with PCP at office visit next week . Patient self care activities - Over the next 90 days, patient will: o Check BP once-twice weekly, document, and provide at future appointments o Ensure daily salt intake < 2300 mg/day o Elevate legs to help with swelling and consider using compression stockings o Discuss swelling with PCP at office visit next week o Exercise for 30 minutes daily 5 times per week (150 minutes total)  Hyperlipidemia Lab Results  Component Value Date/Time   LDLCALC 75 07/08/2019 03:38 PM   . Pharmacist Clinical Goal(s): o Over the next 90 days, patient will work with PharmD and providers to achieve LDL goal < 70 . Current regimen:  o Simvastatin 40mg  daily every evening . Interventions: o Provided dietary and exercise recommendations o Discussed appropriate goals for LDL (less than 70), HDL (greater than 40), and triglycerides (less than 150) . Patient self care activities - Over the next 90 days, patient  will: o Exercise for 30 minutes daily 5 times per week (150 minutes total)  Diabetes Lab Results  Component Value Date/Time   HGBA1C 6.9 (H) 07/08/2019 02:07 PM   HGBA1C 6.8 (H) 12/30/2018 02:17 PM   . Pharmacist Clinical Goal(s): o Over the next 180 days, patient will work with PharmD and providers to maintain A1c goal <7% . Current regimen:  o Metformin 500mg  2 tablets twice daily with morning and evening meal . Interventions: o Provided dietary and exercise recommendations o Discussed appropriate goal for fasting blood sugar (80-130) o Discussed hypoglycemia treatment . Patient self care activities - Over the next 180 days, patient will: o Check blood sugar twice weekly, document, and provide at future appointments o Contact provider with any episodes of hypoglycemia o Exercise for 30 minutes daily 5 times per week (150 minutes total)  Medication management . Pharmacist Clinical Goal(s): o Over the next 90 days, patient will work with PharmD and providers to maintain optimal medication adherence . Current pharmacy: [name] . Interventions o Comprehensive medication review performed. o Continue current medication management strategy . Patient self care activities - Over the next 90 days, patient will: o Focus on medication adherence by utilizing pill box o Take medications as prescribed o Report any questions or concerns to PharmD and/or provider(s)  Initial goal documentation        Ricky Shields was given information about Chronic Care Management services today including:  1. CCM service includes personalized support from designated clinical staff supervised by his physician, including individualized plan of care and coordination with other care providers 2. 24/7 contact  phone numbers for assistance for urgent and routine care needs. 3. Standard insurance, coinsurance, copays and deductibles apply for chronic care management only during months in which we provide at  least 20 minutes of these services. Most insurances cover these services at 100%, however patients may be responsible for any copay, coinsurance and/or deductible if applicable. This service may help you avoid the need for more expensive face-to-face services. 4. Only one practitioner may furnish and bill the service in a calendar month. 5. The patient may stop CCM services at any time (effective at the end of the month) by phone call to the office staff.  Patient agreed to services and verbal consent obtained.   The patient verbalized understanding of instructions provided today and agreed to receive a mailed copy of patient instruction and/or educational materials. Telephone follow up appointment with pharmacy team member scheduled for: 12/06/19 @ 4:15 PM  Ricky Shields, PharmD Clinical Pharmacist [facility] [phone]   Diabetes Mellitus and Nutrition, Adult When you have diabetes (diabetes mellitus), it is very important to have healthy eating habits because your blood sugar (glucose) levels are greatly affected by what you eat and drink. Eating healthy foods in the appropriate amounts, at about the same times every day, can help you:  Control your blood glucose.  Lower your risk of heart disease.  Improve your blood pressure.  Reach or maintain a healthy weight. Every person with diabetes is different, and each person has different needs for a meal plan. Your health care provider may recommend that you work with a diet and nutrition specialist (dietitian) to make a meal plan that is best for you. Your meal plan may vary depending on factors such as:  The calories you need.  The medicines you take.  Your weight.  Your blood glucose, blood pressure, and cholesterol levels.  Your activity level.  Other health conditions you have, such as heart or kidney disease. How do carbohydrates affect me? Carbohydrates, also called carbs, affect your blood  glucose level more than any other type of food. Eating carbs naturally raises the amount of glucose in your blood. Carb counting is a method for keeping track of how many carbs you eat. Counting carbs is important to keep your blood glucose at a healthy level, especially if you use insulin or take certain oral diabetes medicines. It is important to know how many carbs you can safely have in each meal. This is different for every person. Your dietitian can help you calculate how many carbs you should have at each meal and for each snack. Foods that contain carbs include:  Bread, cereal, rice, pasta, and crackers.  Potatoes and corn.  Peas, beans, and lentils.  Milk and yogurt.  Fruit and juice.  Desserts, such as cakes, cookies, ice cream, and candy. How does alcohol affect me? Alcohol can cause a sudden decrease in blood glucose (hypoglycemia), especially if you use insulin or take certain oral diabetes medicines. Hypoglycemia can be a life-threatening condition. Symptoms of hypoglycemia (sleepiness, dizziness, and confusion) are similar to symptoms of having too much alcohol. If your health care provider says that alcohol is safe for you, follow these guidelines:  Limit alcohol intake to no more than 1 drink per day for nonpregnant women and 2 drinks per day for men. One drink equals 12 oz of beer, 5 oz of wine, or 1 oz of hard liquor.  Do not drink on an empty stomach.  Keep yourself hydrated with water, diet  soda, or unsweetened iced tea.  Keep in mind that regular soda, juice, and other mixers may contain a lot of sugar and must be counted as carbs. What are tips for following this plan?  Reading food labels  Start by checking the serving size on the "Nutrition Facts" label of packaged foods and drinks. The amount of calories, carbs, fats, and other nutrients listed on the label is based on one serving of the item. Many items contain more than one serving per package.  Check the  total grams (g) of carbs in one serving. You can calculate the number of servings of carbs in one serving by dividing the total carbs by 15. For example, if a food has 30 g of total carbs, it would be equal to 2 servings of carbs.  Check the number of grams (g) of saturated and trans fats in one serving. Choose foods that have low or no amount of these fats.  Check the number of milligrams (mg) of salt (sodium) in one serving. Most people should limit total sodium intake to less than 2,300 mg per day.  Always check the nutrition information of foods labeled as "low-fat" or "nonfat". These foods may be higher in added sugar or refined carbs and should be avoided.  Talk to your dietitian to identify your daily goals for nutrients listed on the label. Shopping  Avoid buying canned, premade, or processed foods. These foods tend to be high in fat, sodium, and added sugar.  Shop around the outside edge of the grocery store. This includes fresh fruits and vegetables, bulk grains, fresh meats, and fresh dairy. Cooking  Use low-heat cooking methods, such as baking, instead of high-heat cooking methods like deep frying.  Cook using healthy oils, such as olive, canola, or sunflower oil.  Avoid cooking with butter, cream, or high-fat meats. Meal planning  Eat meals and snacks regularly, preferably at the same times every day. Avoid going long periods of time without eating.  Eat foods high in fiber, such as fresh fruits, vegetables, beans, and whole grains. Talk to your dietitian about how many servings of carbs you can eat at each meal.  Eat 4-6 ounces (oz) of lean protein each day, such as lean meat, chicken, fish, eggs, or tofu. One oz of lean protein is equal to: ? 1 oz of meat, chicken, or fish. ? 1 egg. ?  cup of tofu.  Eat some foods each day that contain healthy fats, such as avocado, nuts, seeds, and fish. Lifestyle  Check your blood glucose regularly.  Exercise regularly as told  by your health care provider. This may include: ? 150 minutes of moderate-intensity or vigorous-intensity exercise each week. This could be brisk walking, biking, or water aerobics. ? Stretching and doing strength exercises, such as yoga or weightlifting, at least 2 times a week.  Take medicines as told by your health care provider.  Do not use any products that contain nicotine or tobacco, such as cigarettes and e-cigarettes. If you need help quitting, ask your health care provider.  Work with a Social worker or diabetes educator to identify strategies to manage stress and any emotional and social challenges. Questions to ask a health care provider  Do I need to meet with a diabetes educator?  Do I need to meet with a dietitian?  What number can I call if I have questions?  When are the best times to check my blood glucose? Where to find more information:  American Diabetes Association: diabetes.org  Academy of Nutrition and Dietetics: www.eatright.[employer] of Diabetes and Digestive and Kidney Diseases (NIH): [employer] Summary  A healthy meal plan will help you control your blood glucose and maintain a healthy lifestyle.  Working with a diet and nutrition specialist (dietitian) can help you make a meal plan that is best for you.  Keep in mind that carbohydrates (carbs) and alcohol have immediate effects on your blood glucose levels. It is important to count carbs and to use alcohol carefully. This information is not intended to replace advice given to you by your health care provider. Make sure you discuss any questions you have with your health care provider. Document Revised: 12/13/2016 Document Reviewed: 02/05/2016 Elsevier Patient Education  2020 [employer].

## 2019-10-14 NOTE — Patient Instructions (Signed)

## 2019-10-15 LAB — CMP14+EGFR
ALT: 19 IU/L (ref 0–44)
AST: 19 IU/L (ref 0–40)
Albumin/Globulin Ratio: 1.8 (ref 1.2–2.2)
Albumin: 4.6 g/dL (ref 3.7–4.7)
Alkaline Phosphatase: 71 IU/L (ref 44–121)
BUN/Creatinine Ratio: 14 (ref 10–24)
BUN: 14 mg/dL (ref 8–27)
Bilirubin Total: 0.6 mg/dL (ref 0.0–1.2)
CO2: 24 mmol/L (ref 20–29)
Calcium: 10.2 mg/dL (ref 8.6–10.2)
Chloride: 102 mmol/L (ref 96–106)
Creatinine, Ser: 1.02 mg/dL (ref 0.76–1.27)
GFR calc Af Amer: 84 mL/min/{1.73_m2} (ref >59)
GFR calc non Af Amer: 73 mL/min/{1.73_m2} (ref >59)
Globulin, Total: 2.6 g/dL (ref 1.5–4.5)
Glucose: 137 mg/dL — ABNORMAL HIGH (ref 65–99)
Potassium: 4.2 mmol/L (ref 3.5–5.2)
Sodium: 140 mmol/L (ref 134–144)
Total Protein: 7.2 g/dL (ref 6.0–8.5)

## 2019-10-15 LAB — LIPID PANEL
Chol/HDL Ratio: 2.9 ratio (ref 0.0–5.0)
Cholesterol, Total: 137 mg/dL (ref 100–199)
HDL: 47 mg/dL (ref >39)
LDL Chol Calc (NIH): 75 mg/dL (ref 0–99)
Triglycerides: 74 mg/dL (ref 0–149)
VLDL Cholesterol Cal: 15 mg/dL (ref 5–40)

## 2019-10-15 LAB — HEMOGLOBIN A1C
Est. average glucose Bld gHb Est-mCnc: 157 mg/dL
Hgb A1c MFr Bld: 7.1 % — ABNORMAL HIGH (ref 4.8–5.6)

## 2019-10-15 LAB — CK: Total CK: 166 U/L (ref 41–331)

## 2019-10-19 ENCOUNTER — Other Ambulatory Visit: Payer: Self-pay

## 2019-10-19 ENCOUNTER — Ambulatory Visit (INDEPENDENT_AMBULATORY_CARE_PROVIDER_SITE_OTHER): Payer: PPO

## 2019-10-19 VITALS — BP 128/70 | HR 93 | Temp 98.0°F | Ht 67.0 in | Wt 160.8 lb

## 2019-10-19 DIAGNOSIS — Z23 Encounter for immunization: Secondary | ICD-10-CM | POA: Diagnosis not present

## 2019-10-19 NOTE — Progress Notes (Signed)
Pt is here today for a flu shot

## 2019-10-25 ENCOUNTER — Other Ambulatory Visit: Payer: Self-pay

## 2019-10-25 MED ORDER — GLUCOSE BLOOD VI STRP: ORAL_STRIP | 12 refills | Status: DC

## 2019-10-27 ENCOUNTER — Other Ambulatory Visit: Payer: Self-pay

## 2019-10-27 MED ORDER — ONETOUCH ULTRA VI STRP: ORAL_STRIP | 2 refills | Status: DC

## 2019-11-22 DIAGNOSIS — N401 Enlarged prostate with lower urinary tract symptoms: Secondary | ICD-10-CM | POA: Diagnosis not present

## 2019-11-23 ENCOUNTER — Other Ambulatory Visit: Payer: Self-pay | Admitting: Nurse Practitioner

## 2019-11-29 ENCOUNTER — Other Ambulatory Visit: Payer: Self-pay | Admitting: Nurse Practitioner

## 2019-11-29 DIAGNOSIS — R350 Frequency of micturition: Secondary | ICD-10-CM | POA: Diagnosis not present

## 2019-11-29 DIAGNOSIS — B002 Herpesviral gingivostomatitis and pharyngotonsillitis: Secondary | ICD-10-CM | POA: Diagnosis not present

## 2019-11-29 DIAGNOSIS — N401 Enlarged prostate with lower urinary tract symptoms: Secondary | ICD-10-CM | POA: Diagnosis not present

## 2019-12-06 ENCOUNTER — Telehealth: Payer: Self-pay

## 2019-12-06 DIAGNOSIS — L80 Vitiligo: Secondary | ICD-10-CM | POA: Diagnosis not present

## 2019-12-06 DIAGNOSIS — D485 Neoplasm of uncertain behavior of skin: Secondary | ICD-10-CM | POA: Diagnosis not present

## 2019-12-06 DIAGNOSIS — D2339 Other benign neoplasm of skin of other parts of face: Secondary | ICD-10-CM | POA: Diagnosis not present

## 2019-12-23 ENCOUNTER — Ambulatory Visit: Payer: Self-pay

## 2019-12-23 DIAGNOSIS — E119 Type 2 diabetes mellitus without complications: Secondary | ICD-10-CM

## 2019-12-23 DIAGNOSIS — I10 Essential (primary) hypertension: Secondary | ICD-10-CM

## 2019-12-23 NOTE — Chronic Care Management (AMB) (Signed)
Chronic Care Management Pharmacy  Name: Ricky Shields  MRN: [id number] DOB: 10-13-1945  Chief Complaint/ HPI  Ricky Shields,  74 y.o. , male presents for their Follow-Up CCM visit with the clinical pharmacist via telephone due to COVID-19 Pandemic. His first wife died in 03-13-00 with Breast Cancer and they had two children a son and daughter. His son moved to [place] and her daughter moved to [place]. He now has a second wife and they have been married for 12 years. He used work for [employer] for over 20 years. He is enjoying his retirement but COVID has changed how they move around. He was in the army for 21 years. They are not going out anymore and stay home more often. His wife is also retired and was a Pharmacist, hospital at [employer] in [place], [place]. She has been retired for 3 years. His normal daily routine is praying and giving thanks to God. He then reads his daily bread. He then runs some errands. He belongs to [facility] and they have a lot of friends that fellowship with. At the church you have to bring your vaccination card, and they do temperature checks, social distancing and wearing a mask. 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 He takes his medications in the morning.   PCP : [name], FNP  Their chronic conditions include: Hypertension, Hyperlipidemia and Diabetes  Office Visits: 07/08/19 AWV and OV: Annual exam. HTN/ DM follow up. BP well controlled. Continue regular exercise and low salt diet. EKG done with NSR. DM stable, continue metformin (tolerating well). Pt complains of reoccurring right hip pain. Toradol and Kenalog injection administered in office. Given prescription for diclofenac gel. Pt sees [facility]. Kidney function and blood levels normal. HgbA1c up to 6.9%. Increase fiber intake and exercise. Avoid sugary foods and drinks. Cholesterol normal.   12/30/18 OV: DM/HTN follow up. BP well controlled.  HgbA1c up to 6.8%. Increase physical activity to at least 150 minutes per week. Kidney and liver function normal. Cholesterol levels are normal. Firm mass of left arm which appeared after last blood draw in August/September. Advised to apply warm compress twice daily. Consider ultrasound.   Consult Visits: 09/04/19 COVID test negative  12/31/18 Ophthalmology OV w. Dr. Venetia Maxon: Note unavailable  CCM Encounters:  Medications: Outpatient Encounter Medications as of 12/23/2019  Medication Sig  . acetaminophen (TYLENOL) 500 MG tablet Take 1 tablet (500 mg total) by mouth every 6 (six) hours as needed.  Marland Kitchen aspirin EC 81 MG tablet Take 81 mg by mouth daily. Swallow whole.  . Cholecalciferol (VITAMIN D) 50 MCG (2000 UT) tablet Take 2,000 Units by mouth daily.   . diclofenac Sodium (VOLTAREN) 1 % GEL Apply 2 g topically 4 (four) times daily. (Patient not taking: Reported on 10/07/2019)  . glucose blood (ONETOUCH ULTRA) test strip Use as instructed  . glucose blood test strip Use as instructed to test blood sugars three times a day [id number]  . metFORMIN (GLUCOPHAGE) 1000 MG tablet Take 1 tablet (1,000 mg total) by mouth 2 (two) times daily with a meal.  . mometasone (NASONEX) 50 MCG/ACT nasal spray Place 2 sprays into the nose daily.  . Multiple Vitamin (MULTIVITAMIN WITH MINERALS) TABS tablet Take 1 tablet by mouth daily. One a Day Men's  . Multiple Vitamins-Minerals (OCUVITE PRESERVISION PO) Take 1 tablet by mouth daily.  . Olmesartan-amLODIPine-HCTZ 40-10-12.5 MG TABS TAKE 1 TABLET BY MOUTH  DAILY  . ONE TOUCH ULTRA TEST test strip   . simvastatin (ZOCOR) 40 MG tablet TAKE 1 TABLET(40 MG) BY MOUTH EVERY EVENING  . tamsulosin (FLOMAX) 0.4 MG CAPS capsule Take 0.4 mg by mouth daily.  . valACYclovir (VALTREX) 500 MG tablet Take 500 mg by mouth daily.   No facility-administered encounter medications on file as of 12/23/2019.    Current Diagnosis/Assessment:    Goals Addressed   None      Diabetes   A1c goal <7%  Recent Relevant Labs: Lab Results  Component Value Date/Time   HGBA1C 7.1 (H) 10/14/2019 02:21 PM   HGBA1C 6.9 (H) 07/08/2019 02:07 PM   MICROALBUR 30 07/08/2019 12:51 PM   MICROALBUR 80 06/25/2018 12:39 PM    Last diabetic Eye exam:  Lab Results  Component Value Date/Time   HMDIABEYEEXA No Retinopathy 12/31/2018 12:00 AM    Last diabetic Foot exam: 07/08/19  Checking BG: twice weekly  Recent FBG Readings: 138 Recent pre-meal BG readings:  Recent 2hr PP BG readings:   Recent HS BG readings:   Patient has failed these meds in past: N/A Patient is currently uncontrolled on the following medications: Marland Kitchen Metformin 1000 MG taking 1 tablet by mouth twice per day   We discussed:  . Diet extensively o Pt says that he is a breakfast person o Breakfast: Cereal sometimes with banana, steak, egg, and cheese biscuit - Yesterday: He went to [employer] and had breakfast with a honey bun o Patient only eats sweets maybe three times per month  o Pt states he does not eat pork at all o Pt drinks water all day (about 64 ounces of water daily) - No sodas, occasionally sweet tea . Exercise extensively o Pt works out in home since [employer] o Patient used to walk frequently but due to his back problem and COVID he had to limit the amount of walking he does outside  o Weight lifting with dumbbells 2-3 times weekly about 30 minutes each time o Treadmill and stationary bike 2-3 times weekly for 30-45 minutes o Recommend pt get 30 minutes of moderate intensity exercise daily 5 times a week (150 minutes total per week) . Denies side effects with metformin . FBG goal 80-130 . His BS readings have been 120-130, checks his BS every day in the morning   . Pt states he has had a few instances of low blood sugar o No symptoms of hypoglycemia aside from feeling like he needed to eat something . Patient reports that he has had some low BS readings, because he took his  medication without eating. Has not had a low BS in the past couple of months.  o He would forget to eat anything but now he is eating with his medication  o We discussed how to treat Hypoglycemia     Plan Continue current medications   Hyperlipidemia   LDL goal < 70  Lipid Panel     Component Value Date/Time   CHOL 137 10/14/2019 1421   TRIG 74 10/14/2019 1421   HDL 47 10/14/2019 1421   LDLCALC 75 10/14/2019 1421    Hepatic Function Latest Ref Rng & Units 10/14/2019 07/08/2019 12/30/2018  Total Protein 6.0 - 8.5 g/dL 7.2 7.6 7.2  Albumin 3.7 - 4.7 g/dL 4.6 4.7 4.6  AST 0 - 40 IU/L _0 ALT 0 - 44 IU/L _1 Alk Phosphatase 44 - 121 IU/L 71 81 85  Total Bilirubin 0.0 -  1.2 mg/dL 0.6 0.4 0.5     The 10-year ASCVD risk score Mikey Bussing DC Jr., et al., 2013) is: 21.4%   Values used to calculate the score:     Age: 68 years     Sex: Male     Is Non-Hispanic African American: Yes     Diabetic: Yes     Tobacco smoker: No     Systolic Blood Pressure: 983 mmHg     Is BP treated: No     HDL Cholesterol: 47 mg/dL     Total Cholesterol: 137 mg/dL   Patient has failed these meds in past: N/A Patient is currently uncontrolled on the following medications:  . Simvastatin 66m daily every evening  We discussed:   .Marland KitchenGoals for LDL, HDL, triglycerides Diet and exercise extensively   Patient is eating plenty of vegetables   Increasing the amount of exercise  Takes simvastatin in the evening  Plan Continue current medications   Hypertension   BP goal is:  <130/80  Office blood pressures are  BP Readings from Last 3 Encounters:  10/19/19 128/70  10/14/19 140/78  07/08/19 128/80   Patient checks BP at home 1-2x per week Patient home BP readings are ranging: 138/80  Patient has failed these meds in the past: Valsartan/HCTZ Patient is currently controlled on the following medications:  . Olmesartan/Amlodipine/HCTZ 40/10/12.543mdaily  We discussed: . Marland Kitchenoal BP  <130/80 Diet and exercise extensively  Plan Continue current medications  Will discuss further at next follow up appointment   Benign Prostatic Hyperplasia   Patient has failed these meds in past: Uroxatral Patient is currently controlled on the following medications:   Tamsulosin 0.44m644maily  Plan Continue current medications  Health Maintenance   Patient is currently on the following medications:  . MMarland Kitchenltivitamin daily (One a Day Men's) . Cholecalciferol (27m99m2000 units daily . Aspirin 81mg27mly . Preservision daily . Valacyclovir 500mg 20my . [place] Lidocaine OTC patches - applying one patch to the back as needed but not everyday  . Bio-Freeze Cream -applying to back as needed, not everyday   We discussed:   . Diclofenac can be good for joint pain  Plan Continue current medications   Vaccines   Reviewed and discussed patient's vaccination history.    Immunization History  Administered Date(s) Administered  . Fluad Quad(high Dose 65+) 09/30/2018, 10/19/2019  . Influenza, High Dose Seasonal PF 10/10/2017  . Influenza-Unspecified 11/10/2017  . PFIZER SARS-COV-2 Vaccination 02/19/2019, 03/12/2019  . Pneumococcal Conjugate-13 09/30/2018  . Pneumococcal-Unspecified 11/02/2012, 02/09/2014  . Tdap 11/02/2012   Plan Patient to   Medication Management   Pt uses [facility] medications Pt endorses 98% compliance  We discussed:  . Importance of taking each medication daily as directed . Discussed indications for medications . Pt very seldom misses doses of medications  Plan Continue current medication management strategy  Discuss medication synchronization, adherence packaging, and delivery available with [employer] at follow up   Follow up: 2 month phone visit  VallieOrlando PennermD Clinical Pharmacist [facility] [phone]

## 2019-12-30 LAB — HM DIABETES EYE EXAM

## 2019-12-31 DIAGNOSIS — L905 Scar conditions and fibrosis of skin: Secondary | ICD-10-CM | POA: Diagnosis not present

## 2019-12-31 DIAGNOSIS — D485 Neoplasm of uncertain behavior of skin: Secondary | ICD-10-CM | POA: Diagnosis not present

## 2020-01-03 ENCOUNTER — Encounter: Payer: Self-pay | Admitting: Nurse Practitioner

## 2020-01-05 NOTE — Patient Instructions (Signed)
Visit Information  Goals Addressed            This Visit's Progress   . Pharmacy Care Plan       CARE PLAN ENTRY (see longitudinal plan of care for additional care plan information)  Current Barriers:  . Chronic Disease Management support, education, and care coordination needs related to Hypertension, Hyperlipidemia, and Diabetes   Hypertension BP Readings from Last 3 Encounters:  10/14/19 140/78  07/08/19 128/80  07/08/19 128/80   . Pharmacist Clinical Goal(s): o Over the next 90 days, patient will work with PharmD and providers to maintain BP goal <130/80 . Current regimen:  o Olmesartan/Amlodipine/HCTZ 40/10/12.5mg  daily . Interventions: o Provided dietary and exercise recommendations o Discussed appropriate goal for blood pressure (less than 130/80) o Patient complained of swelling - Recommended patient elevate legs - Consider compression stockings - Could consider increasing diuretic if patient is retaining fluid - Advised patient to discuss with PCP at office visit next week . Patient self care activities - Over the next 90 days, patient will: o Check BP once-twice weekly, document, and provide at future appointments o Ensure daily salt intake < 2300 mg/day o Elevate legs to help with swelling and consider using compression stockings o Discuss swelling with PCP at office visit next week o Exercise for 30 minutes daily 5 times per week (150 minutes total)  Hyperlipidemia Lab Results  Component Value Date/Time   LDLCALC 75 07/08/2019 03:38 PM   . Pharmacist Clinical Goal(s): o Over the next 90 days, patient will work with PharmD and providers to achieve LDL goal < 70 . Current regimen:  o Simvastatin 40mg  daily every evening . Interventions: o Provided dietary and exercise recommendations o Discussed appropriate goals for LDL (less than 70), HDL (greater than 40), and triglycerides (less than 150) . Patient self care activities - Over the next 90 days, patient  will: o Exercise for 30 minutes daily 5 times per week (150 minutes total) o Taking medication every evening on a schedule  Diabetes Lab Results  Component Value Date/Time   HGBA1C 6.9 (H) 07/08/2019 02:07 PM   HGBA1C 6.8 (H) 12/30/2018 02:17 PM   . Pharmacist Clinical Goal(s): o Over the next 180 days, patient will work with PharmD and providers to maintain A1c goal <7% . Current regimen:  o Metformin 500mg  2 tablets twice daily with morning and evening meal . Interventions: o Provided dietary and exercise recommendations o Discussed appropriate goal for fasting blood sugar (80-130) o Discussed hypoglycemia treatment . Patient self care activities - Over the next 180 days, patient will: o Check blood sugar twice weekly, document, and provide at future appointments o Contact provider with any episodes of hypoglycemia o Exercise for 30 minutes daily 5 times per week (150 minutes total)  Medication management . Pharmacist Clinical Goal(s): o Over the next 90 days, patient will work with PharmD and providers to maintain optimal medication adherence . Current pharmacy: [name] . Interventions o Comprehensive medication review performed. o Continue current medication management strategy . Patient self care activities - Over the next 90 days, patient will: o Focus on medication adherence by utilizing pill box o Take medications as prescribed o Report any questions or concerns to PharmD and/or provider(s)  Initial goal documentation        The patient verbalized understanding of instructions, educational materials, and care plan provided today and declined offer to receive copy of patient instructions, educational materials, and care plan.   The pharmacy team will  reach out to the patient again over the next 90 days.   Harlan Stains, [facility]

## 2020-01-10 ENCOUNTER — Ambulatory Visit (INDEPENDENT_AMBULATORY_CARE_PROVIDER_SITE_OTHER): Payer: PPO | Admitting: Nurse Practitioner

## 2020-01-10 ENCOUNTER — Other Ambulatory Visit: Payer: Self-pay

## 2020-01-10 ENCOUNTER — Encounter: Payer: Self-pay | Admitting: Nurse Practitioner

## 2020-01-10 VITALS — BP 120/76 | HR 87 | Temp 98.4°F | Ht 67.0 in | Wt 161.0 lb

## 2020-01-10 DIAGNOSIS — M545 Low back pain, unspecified: Secondary | ICD-10-CM

## 2020-01-10 DIAGNOSIS — G8929 Other chronic pain: Secondary | ICD-10-CM

## 2020-01-10 DIAGNOSIS — I10 Essential (primary) hypertension: Secondary | ICD-10-CM

## 2020-01-10 DIAGNOSIS — E119 Type 2 diabetes mellitus without complications: Secondary | ICD-10-CM

## 2020-01-10 NOTE — Progress Notes (Signed)
Rutherford Nail as a scribe for [name], FNP.,have documented all relevant documentation on the behalf of [name], FNP,as directed by  [name], FNP while in the presence of [name], [place].  This visit occurred during the SARS-CoV-2 public health emergency.  Safety protocols were in place, including screening questions prior to the visit, additional usage of staff PPE, and extensive cleaning of exam room while observing appropriate contact time as indicated for disinfecting solutions.  Subjective:     Patient ID: Ricky Shields , male    DOB: 1945-10-25 , 74 y.o.   MRN: [id number]   Chief Complaint  Patient presents with   Hypertension   Diabetes    HPI  Patient here for a blood pressure check and diabetes f/u.    Wt Readings from Last 3 Encounters: 01/10/20 : 161 lb (73 kg) 10/19/19 : 160 lb 12.8 oz (72.9 kg) 10/14/19 : 160 lb (72.6 kg)  He was seen by Dr. Allyson Sabal had area removed from his chin, he went to the surgical center on [address] - and cleaned it out better. Thought to be a cyst - benign.    Diabetes He presents for his follow-up diabetic visit. He has type 2 diabetes mellitus. His disease course has been stable. There are no hypoglycemic associated symptoms. Pertinent negatives for hypoglycemia include no dizziness or headaches. Pertinent negatives for diabetes include no blurred vision, no chest pain, no fatigue, no polydipsia, no polyphagia and no polyuria. There are no hypoglycemic complications. Symptoms are stable. There are no diabetic complications. Risk factors for coronary artery disease include male sex and sedentary lifestyle. Current diabetic treatment includes oral agent (monotherapy). He is compliant with treatment all of the time. His weight is stable. He is following a diabetic diet. When asked about meal planning, he reported none. He has not had a previous visit with a dietitian. He participates in exercise intermittently. An ACE  inhibitor/angiotensin II receptor blocker is being taken. He does not see a podiatrist.Eye exam is current (scheduled for tomorrow).  Hypertension This is a chronic problem. The current episode started more than 1 year ago. The problem is unchanged. The problem is controlled. Pertinent negatives include no anxiety, blurred vision, chest pain, headaches or palpitations. There are no associated agents to hypertension. Risk factors for coronary artery disease include sedentary lifestyle and male gender. Past treatments include angiotensin blockers and diuretics. The current treatment provides no improvement. There are no compliance problems.  There is no history of angina. There is no history of chronic renal disease.     Past Medical History:  Diagnosis Date   Back pain    intermittent-prior military injury   BPH (benign prostatic hypertrophy) with urinary retention    Diabetes mellitus without complication (HCC)    GERD (gastroesophageal reflux disease)    High cholesterol    Hypertension      Family History  Problem Relation Age of Onset   Stroke Mother    Stroke Father      Current Outpatient Medications:    acetaminophen (TYLENOL) 500 MG tablet, Take 1 tablet (500 mg total) by mouth every 6 (six) hours as needed., Disp: 30 tablet, Rfl: 0   aspirin EC 81 MG tablet, Take 81 mg by mouth daily. Swallow whole., Disp: , Rfl:    Cholecalciferol (VITAMIN D) 50 MCG (2000 UT) tablet, Take 2,000 Units by mouth daily. , Disp: , Rfl:    glucose blood (ONETOUCH ULTRA) test strip, Use as instructed, Disp:  100 each, Rfl: 2   glucose blood test strip, Use as instructed to test blood sugars three times a day DX:E11.65, Disp: 100 each, Rfl: 12   metFORMIN (GLUCOPHAGE) 1000 MG tablet, Take 1 tablet (1,000 mg total) by mouth 2 (two) times daily with a meal., Disp: 180 tablet, Rfl: 1   Multiple Vitamin (MULTIVITAMIN WITH MINERALS) TABS tablet, Take 1 tablet by mouth daily. One a Day Men's,  Disp: , Rfl:    Multiple Vitamins-Minerals (OCUVITE PRESERVISION PO), Take 1 tablet by mouth daily., Disp: , Rfl:    Olmesartan-amLODIPine-HCTZ 40-10-12.5 MG TABS, TAKE 1 TABLET BY MOUTH DAILY, Disp: 90 tablet, Rfl: 0   ONE TOUCH ULTRA TEST test strip, , Disp: , Rfl: 0   simvastatin (ZOCOR) 40 MG tablet, TAKE 1 TABLET(40 MG) BY MOUTH EVERY EVENING, Disp: 90 tablet, Rfl: 0   tamsulosin (FLOMAX) 0.4 MG CAPS capsule, Take 0.4 mg by mouth daily., Disp: , Rfl: 5   valACYclovir (VALTREX) 500 MG tablet, Take 500 mg by mouth daily., Disp: , Rfl:    mometasone (NASONEX) 50 MCG/ACT nasal spray, Place 2 sprays into the nose daily., Disp: 17 g, Rfl: 2   Allergies  Allergen Reactions   Penicillins Rash    Has patient had a PCN reaction causing immediate rash, facial/tongue/throat swelling, SOB or lightheadedness with hypotension: No Has patient had a PCN reaction causing severe rash involving mucus membranes or skin necrosis: No Has patient had a PCN reaction that required hospitalization No Has patient had a PCN reaction occurring within the last 10 years: No If all of the above answers are "NO", then may proceed with Cephalosporin use.      Review of Systems  Constitutional: Negative.  Negative for fatigue.  HENT: Negative.   Eyes: Negative for blurred vision.  Respiratory: Negative.   Cardiovascular: Negative for chest pain, palpitations and leg swelling.  Endocrine: Negative for polydipsia, polyphagia and polyuria.  Musculoskeletal: Negative.   Skin: Negative.   Neurological: Negative for dizziness and headaches.  Psychiatric/Behavioral: Negative.      Today's Vitals   01/10/20 1136  BP: 120/76  Pulse: 87  Temp: 98.4 F (36.9 C)  TempSrc: Oral  Weight: 161 lb (73 kg)  Height: _0  (1.702 m)   Body mass index is 25.22 kg/m.  Wt Readings from Last 3 Encounters:  01/10/20 161 lb (73 kg)  10/19/19 160 lb 12.8 oz (72.9 kg)  10/14/19 160 lb (72.6 kg)   Objective:   Physical Exam Vitals reviewed.  Constitutional:      General: He is not in acute distress.    Appearance: Normal appearance.  Cardiovascular:     Rate and Rhythm: Normal rate and regular rhythm.     Pulses: Normal pulses.     Heart sounds: Normal heart sounds. No murmur heard.   Pulmonary:     Effort: No respiratory distress.     Breath sounds: Normal breath sounds. No wheezing.  Musculoskeletal:        General: No swelling.  Skin:    General: Skin is warm and dry.     Capillary Refill: Capillary refill takes less than 2 seconds.  Neurological:     General: No focal deficit present.     Mental Status: He is alert and oriented to person, place, and time.     Cranial Nerves: No cranial nerve deficit.  Psychiatric:        Mood and Affect: Mood normal.        Behavior:  Behavior normal.        Thought Content: Thought content normal.        Judgment: Judgment normal.         Assessment And Plan:     1. Essential hypertension  Chronic, excellent control  Continue with current medications - BMP8+eGFR  2. Type 2 diabetes mellitus without complication, without long-term current use of insulin (HCC)  Chronic, slightly elevated HgbA1c at last visit  Will check HgbA1c  Encouraged to continue with regular exercise he does admit to not walking as much since covid - Lipid panel - BMP8+eGFR - Hemoglobin A1c  3. Chronic right-sided low back pain without sciatica  He would like a referral to orthopedics for further evaluation  I offered to send him for physical therapy, this pain could be related to not being as active since Covid  I have given him samples of Salonpas that he can purchase over the counter - Ambulatory referral to Orthopedic Surgery     Patient was given opportunity to ask questions. Patient verbalized understanding of the plan and was able to repeat key elements of the plan. All questions were answered to their satisfaction.  [name], FNP    I,  [name], FNP, have reviewed all documentation for this visit. The documentation on 01/10/20 for the exam, diagnosis, procedures, and orders are all accurate and complete.   THE PATIENT IS ENCOURAGED TO PRACTICE SOCIAL DISTANCING DUE TO THE COVID-19 PANDEMIC.

## 2020-01-10 NOTE — Patient Instructions (Signed)
Diabetes Mellitus and Exercise Exercising regularly is important for your overall health, especially when you have diabetes (diabetes mellitus). Exercising is not only about losing weight. It has many other health benefits, such as increasing muscle strength and bone density and reducing body fat and stress. This leads to improved fitness, flexibility, and endurance, all of which result in better overall health. Exercise has additional benefits for people with diabetes, including:  Reducing appetite.  Helping to lower and control blood glucose.  Lowering blood pressure.  Helping to control amounts of fatty substances (lipids) in the blood, such as cholesterol and triglycerides.  Helping the body to respond better to insulin (improving insulin sensitivity).  Reducing how much insulin the body needs.  Decreasing the risk for heart disease by: ? Lowering cholesterol and triglyceride levels. ? Increasing the levels of good cholesterol. ? Lowering blood glucose levels. What is my activity plan? Your health care provider or certified diabetes educator can help you make a plan for the type and frequency of exercise (activity plan) that works for you. Make sure that you:  Do at least 150 minutes of moderate-intensity or vigorous-intensity exercise each week. This could be brisk walking, biking, or water aerobics. ? Do stretching and strength exercises, such as yoga or weightlifting, at least 2 times a week. ? Spread out your activity over at least 3 days of the week.  Get some form of physical activity every day. ? Do not go more than 2 days in a row without some kind of physical activity. ? Avoid being inactive for more than 30 minutes at a time. Take frequent breaks to walk or stretch.  Choose a type of exercise or activity that you enjoy, and set realistic goals.  Start slowly, and gradually increase the intensity of your exercise over time. What do I need to know about managing my  diabetes?   Check your blood glucose before and after exercising. ? If your blood glucose is 240 mg/dL (13.3 mmol/L) or higher before you exercise, check your urine for ketones. If you have ketones in your urine, do not exercise until your blood glucose returns to normal. ? If your blood glucose is 100 mg/dL (5.6 mmol/L) or lower, eat a snack containing 15-20 grams of carbohydrate. Check your blood glucose 15 minutes after the snack to make sure that your level is above 100 mg/dL (5.6 mmol/L) before you start your exercise.  Know the symptoms of low blood glucose (hypoglycemia) and how to treat it. Your risk for hypoglycemia increases during and after exercise. Common symptoms of hypoglycemia can include: ? Hunger. ? Anxiety. ? Sweating and feeling clammy. ? Confusion. ? Dizziness or feeling light-headed. ? Increased heart rate or palpitations. ? Blurry vision. ? Tingling or numbness around the mouth, lips, or tongue. ? Tremors or shakes. ? Irritability.  Keep a rapid-acting carbohydrate snack available before, during, and after exercise to help prevent or treat hypoglycemia.  Avoid injecting insulin into areas of the body that are going to be exercised. For example, avoid injecting insulin into: ? The arms, when playing tennis. ? The legs, when jogging.  Keep records of your exercise habits. Doing this can help you and your health care provider adjust your diabetes management plan as needed. Write down: ? Food that you eat before and after you exercise. ? Blood glucose levels before and after you exercise. ? The type and amount of exercise you have done. ? When your insulin is expected to peak, if you use   insulin. Avoid exercising at times when your insulin is peaking.  When you start a new exercise or activity, work with your health care provider to make sure the activity is safe for you, and to adjust your insulin, medicines, or food intake as needed.  Drink plenty of water while  you exercise to prevent dehydration or heat stroke. Drink enough fluid to keep your urine clear or pale yellow. Summary  Exercising regularly is important for your overall health, especially when you have diabetes (diabetes mellitus).  Exercising has many health benefits, such as increasing muscle strength and bone density and reducing body fat and stress.  Your health care provider or certified diabetes educator can help you make a plan for the type and frequency of exercise (activity plan) that works for you.  When you start a new exercise or activity, work with your health care provider to make sure the activity is safe for you, and to adjust your insulin, medicines, or food intake as needed. This information is not intended to replace advice given to you by your health care provider. Make sure you discuss any questions you have with your health care provider. Document Revised: 07/25/2016 Document Reviewed: 06/12/2015 Elsevier Patient Education  2020 Else[employer] Inc[employer] Hypertension, Adult Hypertension is another name for high blood pressure. High blood pressure forces your heart to work harder to pump blood. This can cause problems over time. There are two numbers in a blood pressure reading. There is a top number (systolic) over a bottom number (diastolic). It is best to have a blood pressure that is below 120/80. Healthy choices can help lower your blood pressure, or you may need medicine to help lower it. What are the causes? The cause of this condition is not known. Some conditions may be related to high blood pressure. What increases the risk?  Smoking.  Having type 2 diabetes mellitus, high cholesterol, or both.  Not getting enough exercise or physical activity.  Being overweight.  Having too much fat, sugar, calories, or salt (sodium) in your diet.  Drinking too much alcohol.  Having long-term (chronic) kidney disease.  Having a family history of high blood pressure.  Age.  Risk increases with age.  Race. You may be at higher risk if you are African American.  Gender. Men are at higher risk than women before age 45. After age 65, women are at higher risk than men.  Having obstructive sleep apnea.  Stress. What are the signs or symptoms?  High blood pressure may not cause symptoms. Very high blood pressure (hypertensive crisis) may cause: ? Headache. ? Feelings of worry or nervousness (anxiety). ? Shortness of breath. ? Nosebleed. ? A feeling of being sick to your stomach (nausea). ? Throwing up (vomiting). ? Changes in how you see. ? Very bad chest pain. ? Seizures. How is this treated?  This condition is treated by making healthy lifestyle changes, such as: ? Eating healthy foods. ? Exercising more. ? Drinking less alcohol.  Your health care provider may prescribe medicine if lifestyle changes are not enough to get your blood pressure under control, and if: ? Your top number is above 130. ? Your bottom number is above 80.  Your personal target blood pressure may vary. Follow these instructions at home: Eating and drinking   If told, follow the DASH eating plan. To follow this plan: ? Fill one half of your plate at each meal with fruits and vegetables. ? Fill one fourth of your plate at each   meal with whole grains. Whole grains include whole-wheat pasta, brown rice, and whole-grain bread. ? Eat or drink low-fat dairy products, such as skim milk or low-fat yogurt. ? Fill one fourth of your plate at each meal with low-fat (lean) proteins. Low-fat proteins include fish, chicken without skin, eggs, beans, and tofu. ? Avoid fatty meat, cured and processed meat, or chicken with skin. ? Avoid pre-made or processed food.  Eat less than 1,500 mg of salt each day.  Do not drink alcohol if: ? Your doctor tells you not to drink. ? You are pregnant, may be pregnant, or are planning to become pregnant.  If you drink alcohol: ? Limit how much you  use to:  0-1 drink a day for women.  0-2 drinks a day for men. ? Be aware of how much alcohol is in your drink. In the [place] one drink equals one 12 oz bottle of beer (355 mL), one 5 oz glass of wine (148 mL), or one 1 oz glass of hard liquor (44 mL). Lifestyle   Work with your doctor to stay at a healthy weight or to lose weight. Ask your doctor what the best weight is for you.  Get at least 30 minutes of exercise most days of the week. This may include walking, swimming, or biking.  Get at least 30 minutes of exercise that strengthens your muscles (resistance exercise) at least 3 days a week. This may include lifting weights or doing Pilates.  Do not use any products that contain nicotine or tobacco, such as cigarettes, e-cigarettes, and chewing tobacco. If you need help quitting, ask your doctor.  Check your blood pressure at home as told by your doctor.  Keep all follow-up visits as told by your doctor. This is important. Medicines  Take over-the-counter and prescription medicines only as told by your doctor. Follow directions carefully.  Do not skip doses of blood pressure medicine. The medicine does not work as well if you skip doses. Skipping doses also puts you at risk for problems.  Ask your doctor about side effects or reactions to medicines that you should watch for. Contact a doctor if you:  Think you are having a reaction to the medicine you are taking.  Have headaches that keep coming back (recurring).  Feel dizzy.  Have swelling in your ankles.  Have trouble with your vision. Get help right away if you:  Get a very bad headache.  Start to feel mixed up (confused).  Feel weak or numb.  Feel faint.  Have very bad pain in your: ? Chest. ? Belly (abdomen).  Throw up more than once.  Have trouble breathing. Summary  Hypertension is another name for high blood pressure.  High blood pressure forces your heart to work harder to pump blood.  For  most people, a normal blood pressure is less than 120/80.  Making healthy choices can help lower blood pressure. If your blood pressure does not get lower with healthy choices, you may need to take medicine. This information is not intended to replace advice given to you by your health care provider. Make sure you discuss any questions you have with your health care provider. Document Revised: 09/10/2017 Document Reviewed: 09/10/2017 Elsevier Patient Education  2020 [employer] Inc[employer]  

## 2020-01-11 ENCOUNTER — Other Ambulatory Visit: Payer: Self-pay | Admitting: Nurse Practitioner

## 2020-01-11 ENCOUNTER — Ambulatory Visit: Payer: PPO | Admitting: Nurse Practitioner

## 2020-01-11 DIAGNOSIS — E119 Type 2 diabetes mellitus without complications: Secondary | ICD-10-CM

## 2020-01-11 LAB — BMP8+EGFR
BUN/Creatinine Ratio: 14 (ref 10–24)
BUN: 17 mg/dL (ref 8–27)
CO2: 22 mmol/L (ref 20–29)
Calcium: 9.8 mg/dL (ref 8.6–10.2)
Chloride: 102 mmol/L (ref 96–106)
Creatinine, Ser: 1.19 mg/dL (ref 0.76–1.27)
GFR calc Af Amer: 70 mL/min/{1.73_m2} (ref >59)
GFR calc non Af Amer: 60 mL/min/{1.73_m2} (ref >59)
Glucose: 126 mg/dL — ABNORMAL HIGH (ref 65–99)
Potassium: 4.4 mmol/L (ref 3.5–5.2)
Sodium: 141 mmol/L (ref 134–144)

## 2020-01-11 LAB — LIPID PANEL
Chol/HDL Ratio: 3.1 ratio (ref 0.0–5.0)
Cholesterol, Total: 150 mg/dL (ref 100–199)
HDL: 49 mg/dL (ref >39)
LDL Chol Calc (NIH): 86 mg/dL (ref 0–99)
Triglycerides: 79 mg/dL (ref 0–149)
VLDL Cholesterol Cal: 15 mg/dL (ref 5–40)

## 2020-01-11 LAB — HEMOGLOBIN A1C
Est. average glucose Bld gHb Est-mCnc: 154 mg/dL
Hgb A1c MFr Bld: 7 % — ABNORMAL HIGH (ref 4.8–5.6)

## 2020-01-11 MED ORDER — EMPAGLIFLOZIN 10 MG PO TABS: 10.0000 mg | ORAL_TABLET | Freq: Every day | ORAL | 2 refills | Status: DC

## 2020-01-11 NOTE — Progress Notes (Signed)
I will start him on Jardiance 10 mg take once a day, if he has vomiting or diarrhea he needs to hold the medication and schedule a follow up medication visit for 4-6 weeks.

## 2020-01-12 ENCOUNTER — Ambulatory Visit (INDEPENDENT_AMBULATORY_CARE_PROVIDER_SITE_OTHER): Payer: PPO

## 2020-01-12 ENCOUNTER — Encounter: Payer: Self-pay | Admitting: Orthopaedic Surgery

## 2020-01-12 ENCOUNTER — Ambulatory Visit (INDEPENDENT_AMBULATORY_CARE_PROVIDER_SITE_OTHER): Payer: PPO | Admitting: Orthopaedic Surgery

## 2020-01-12 ENCOUNTER — Telehealth: Payer: Self-pay

## 2020-01-12 VITALS — BP 129/63 | HR 106 | Ht 69.0 in | Wt 160.0 lb

## 2020-01-12 DIAGNOSIS — M545 Low back pain, unspecified: Secondary | ICD-10-CM

## 2020-01-12 DIAGNOSIS — G8929 Other chronic pain: Secondary | ICD-10-CM

## 2020-01-12 MED ORDER — TRAMADOL HCL 50 MG PO TABS: 50.0000 mg | ORAL_TABLET | Freq: Two times a day (BID) | ORAL | 0 refills | Status: DC | PRN

## 2020-01-12 NOTE — Progress Notes (Signed)
Office Visit Note   Patient: Ricky Shields           Date of Birth: 09-20-1945           MRN: [id number] Visit Date: 01/12/2020              Requested by: Arnette Felts, FNP [address] [place],  [place] [address] PCP: Arnette Felts, FNP   Assessment & Plan: Visit Diagnoses:  1. Chronic bilateral low back pain, unspecified whether sciatica present     Plan: Patient been treated for several months with anti-inflammatories without relief now on Tylenol.  Previous injections for several months.  Would recommend proceeding with MRI scan to evaluate him for L4-5 right lateral recess stenosis.  Office follow-up after MRI.  Follow-Up Instructions: No follow-ups on file.   Orders:  Orders Placed This Encounter  Procedures  . XR Lumbar Spine 2-3 Views  . MR Lumbar Spine w/o contrast   Meds ordered this encounter  Medications  . traMADol (ULTRAM) 50 MG tablet    Sig: Take 1 tablet (50 mg total) by mouth every 12 (twelve) hours as needed.    Dispense:  30 tablet    Refill:  0      Procedures: No procedures performed   Clinical Data: No additional findings.   Subjective: Chief Complaint  Patient presents with  . Lower Back - Pain    HPI 74 year old male with chronic back pain that radiates into the right buttocks right leg with tingling into his foot.  Occasionally has had symptoms on the left side but mostly on the right.  He has had previous injections with Toradol IM.  He has been taking Aleve but he was told to stop this due to concerns about kidney problems and switch to Tylenol.  States Tylenol has not given him any relief.  He was in the [employer] in the 70s did some heavy lifting activities does not recall any specific injury to his back at that time.  Patient is here today with his wife.  Review of Systems plus for type 2 diabetes.  Also hypertension.   Objective: Vital Signs: BP 129/63   Pulse (!) 106   Ht 5\' 9"  (1.753 m)   Wt 160 lb (72.6 kg)    BMI 23.63 kg/m   Physical Exam Constitutional:      Appearance: He is well-developed and well-nourished.  HENT:     Head: Normocephalic and atraumatic.  Eyes:     Extraocular Movements: EOM normal.     Pupils: Pupils are equal, round, and reactive to light.  Neck:     Thyroid: No thyromegaly.     Trachea: No tracheal deviation.  Cardiovascular:     Rate and Rhythm: Normal rate.  Pulmonary:     Effort: Pulmonary effort is normal.     Breath sounds: No wheezing.  Abdominal:     General: Bowel sounds are normal.     Palpations: Abdomen is soft.  Skin:    General: Skin is warm and dry.     Capillary Refill: Capillary refill takes less than 2 seconds.  Neurological:     Mental Status: He is alert and oriented to person, place, and time.  Psychiatric:        Mood and Affect: Mood and affect normal.        Behavior: Behavior normal.        Thought Content: Thought content normal.        Judgment:  Judgment normal.     Ortho Exam straight leg raising on the right 70 degrees positive sciatic notch tenderness.  Positive popliteal compression test.  Knee and ankle jerk are intact decreased sensation over the dorsal foot on the right normal on the left.  He has some EHL weakness on the right normal on the left anterior tib test trace weak on the right normal on the left.  He was able to toe walk.  Specialty Comments:  No specialty comments available.  Imaging: No results found.   PMFS History: Patient Active Problem List   Diagnosis Date Noted  . Tingling of skin 03/20/2018  . Type 2 diabetes mellitus without complication, without long-term current use of insulin ([place]) 12/19/2017  . Essential hypertension 12/19/2017   Past Medical History:  Diagnosis Date  . Back pain    intermittent-prior military injury  . BPH (benign prostatic hypertrophy) with urinary retention   . Diabetes mellitus without complication ([place])   . GERD (gastroesophageal reflux disease)   . High  cholesterol   . Hypertension     Family History  Problem Relation Age of Onset  . Stroke Mother   . Stroke Father     Past Surgical History:  Procedure Laterality Date  . COLONOSCOPY    . GREEN LIGHT LASER TURP (TRANSURETHRAL RESECTION OF PROSTATE N/A 10/21/2014   Procedure: GREEN LIGHT LASER TURP (TRANSURETHRAL RESECTION OF PROSTATE;  Surgeon: Festus Aloe, MD;  Location: [facility];  Service: Urology;  Laterality: N/A;   Social History   Occupational History  . Occupation: retired  Tobacco Use  . Smoking status: Never Smoker  . Smokeless tobacco: Never Used  Vaping Use  . Vaping Use: Never used  Substance and Sexual Activity  . Alcohol use: No    Alcohol/week: 0.0 standard drinks  . Drug use: No  . Sexual activity: Yes

## 2020-01-12 NOTE — Telephone Encounter (Signed)
-----   Message from Arnette Felts, FNP sent at 01/11/2020 10:14 AM EST ----- Cholesterol levels are normal. Kidney functions are normal. HgbA1c is stable at 7, I feel like we need to add a medication to help with your levels are you willing to do this?

## 2020-01-12 NOTE — Telephone Encounter (Signed)
I called the pt to let him know that Ludwig Clarks, FNP-BC is starting the pt on Jardiance 10 mg daily and the pt needed to follow-up with her 4-6 wks.

## 2020-01-20 ENCOUNTER — Telehealth: Payer: Self-pay

## 2020-01-20 NOTE — Chronic Care Management (AMB) (Signed)
Chronic Care Management Pharmacy Assistant   Name: Ricky Shields  MRN: [id number] DOB: 11-09-1945  Reason for Encounter: Diabetes, Hypertension, Hyperlipidemia Adherence Call  Patient Questions:  1.  Have you seen any other providers since your last visit? Yes, 01/12/20-Yates, Thana Farr, MD (OV). 01/10/20-Moore, Doreene Burke, FNP (OV).    2.  Any changes in your medicines or health? Yes, 01/12/20-Tramadol HCI 50 mg added. 01/10/20-Diclofenac Sodium (D/C).    PCP : [name], FNP  Allergies:   Allergies  Allergen Reactions   Penicillins Rash    Has patient had a PCN reaction causing immediate rash, facial/tongue/throat swelling, SOB or lightheadedness with hypotension: No Has patient had a PCN reaction causing severe rash involving mucus membranes or skin necrosis: No Has patient had a PCN reaction that required hospitalization No Has patient had a PCN reaction occurring within the last 10 years: No If all of the above answers are "NO", then may proceed with Cephalosporin use.     Medications: Outpatient Encounter Medications as of 01/20/2020  Medication Sig   acetaminophen (TYLENOL) 500 MG tablet Take 1 tablet (500 mg total) by mouth every 6 (six) hours as needed.   aspirin EC 81 MG tablet Take 81 mg by mouth daily. Swallow whole.   Cholecalciferol (VITAMIN D) 50 MCG (2000 UT) tablet Take 2,000 Units by mouth daily.    empagliflozin (JARDIANCE) 10 MG TABS tablet Take 1 tablet (10 mg total) by mouth daily before breakfast.   glucose blood (ONETOUCH ULTRA) test strip Use as instructed   glucose blood test strip Use as instructed to test blood sugars three times a day [id number]   metFORMIN (GLUCOPHAGE) 1000 MG tablet Take 1 tablet (1,000 mg total) by mouth 2 (two) times daily with a meal.   mometasone (NASONEX) 50 MCG/ACT nasal spray Place 2 sprays into the nose daily.   Multiple Vitamin (MULTIVITAMIN WITH MINERALS) TABS tablet Take 1 tablet by mouth daily. One a Day Men's    Multiple Vitamins-Minerals (OCUVITE PRESERVISION PO) Take 1 tablet by mouth daily.   Olmesartan-amLODIPine-HCTZ 40-10-12.5 MG TABS TAKE 1 TABLET BY MOUTH DAILY   ONE TOUCH ULTRA TEST test strip    simvastatin (ZOCOR) 40 MG tablet TAKE 1 TABLET(40 MG) BY MOUTH EVERY EVENING   tamsulosin (FLOMAX) 0.4 MG CAPS capsule Take 0.4 mg by mouth daily.   traMADol (ULTRAM) 50 MG tablet Take 1 tablet (50 mg total) by mouth every 12 (twelve) hours as needed.   valACYclovir (VALTREX) 500 MG tablet Take 500 mg by mouth daily.   No facility-administered encounter medications on file as of 01/20/2020.    Current Diagnosis: Patient Active Problem List   Diagnosis Date Noted   Tingling of skin 03/20/2018   Type 2 diabetes mellitus without complication, without long-term current use of insulin ([place]) 12/19/2017   Essential hypertension 12/19/2017   Recent Relevant Labs: Lab Results  Component Value Date/Time   HGBA1C 7.0 (H) 01/10/2020 12:24 PM   HGBA1C 7.1 (H) 10/14/2019 02:21 PM   MICROALBUR 30 07/08/2019 12:51 PM   MICROALBUR 80 06/25/2018 12:39 PM    Kidney Function Lab Results  Component Value Date/Time   CREATININE 1.19 01/10/2020 12:24 PM   CREATININE 1.02 10/14/2019 02:21 PM   GFRNONAA 60 01/10/2020 12:24 PM   GFRAA 70 01/10/2020 12:24 PM     Current antihyperglycemic regimen:   Metformin 1000 MG taking 1 tablet by mouth twice per day    What recent interventions/DTPs have been made to improve glycemic  control:  o Recent interventions was to check blood sugar twice weekly, contact provider with any episodes of hypoglycemia, exercise for 30 minutes daily 5 times per week (150 minutes total).  o   Have there been any recent hospitalizations or ED visits since last visit with CPP? No    Patient denies hypoglycemic symptoms, including Pale, Shaky, Hungry, Nervous/irritable and Vision changes. Patient reports feeling sweaty when blood sugar gets low.   Patient denies  hyperglycemic symptoms, including blurry vision, excessive thirst, fatigue, polyuria and weakness   How often are you checking your blood sugar? twice daily    What are your blood sugars ranging?  o Fasting: 127 in a.m; 01/19/20,  154 a.m. 01/21/20. o Before meals: none o After meals: ranges in the 160s o Bedtime: none   During the week, how often does your blood glucose drop below 70? Never    Are you checking your feet daily/regularly? Patient checks his feet daily with no issues to report.   Adherence Review: Is the patient currently on a STATIN medication? Yes Is the patient currently on ACE/ARB medication? Yes Does the patient have >5 day gap between last estimated fill dates? No   Reviewed chart prior to disease state call. Spoke with patient regarding BP  Recent Office Vitals: BP Readings from Last 3 Encounters:  01/12/20 129/63  01/10/20 120/76  10/19/19 128/70   Pulse Readings from Last 3 Encounters:  01/12/20 (!) 106  01/10/20 87  10/19/19 93    Wt Readings from Last 3 Encounters:  01/12/20 160 lb (72.6 kg)  01/10/20 161 lb (73 kg)  10/19/19 160 lb 12.8 oz (72.9 kg)     Kidney Function Lab Results  Component Value Date/Time   CREATININE 1.19 01/10/2020 12:24 PM   CREATININE 1.02 10/14/2019 02:21 PM   GFRNONAA 60 01/10/2020 12:24 PM   GFRAA 70 01/10/2020 12:24 PM    BMP Latest Ref Rng & Units 01/10/2020 10/14/2019 07/08/2019  Glucose 65 - 99 mg/dL 254(Y) 706(C) 376(E)  BUN 8 - 27 mg/dL 17 14 11   Creatinine 0.76 - 1.27 mg/dL 8.31 5.17  BUN/Creat Ratio 10 - 24 14 14 11   Sodium 134 - 144 mmol/L 141 140 138  Potassium 3.5 - 5.2 mmol/L 4.4 4.2 3.7  Chloride 96 - 106 mmol/L 102 102 99  CO2 20 - 29 mmol/L 22 24 23   Calcium 8.6 - 10.2 mg/dL 9.8 6.16 10.3(H)     Current antihypertensive regimen:  ? Olmesartan/Amlodipine/HCTZ 40/10/12.5mg  daily   How often are you checking your Blood Pressure? daily    Current home BP readings: 127/84 this  morning.    What recent interventions/DTPs have been made by any provider to improve Blood Pressure control since last CPP Visit:  - Recent interventions was to check BP once-twice weekly, Ensure daily salt intake < 2300 mg/day, Elevate legs to help with swelling and consider using compression stockings, Exercise for 30 minutes daily 5 times per week (150 minutes total). - Patient reports he does elevates his legs and feet daily. - Patient states he is taking blood pressure medications as directed.   Any recent hospitalizations or ED visits since last visit with CPP? No    What diet changes have been made to improve Blood Pressure Control?   Patient reports he does not eat pork. Patient states he likes to eat salads with cucumber, tomato, cranberries, pecans, and walnuts; sometimes add meat slices. Patient also states he eats oatmeal with raisins, Jell-O with  mixed fruit, bananas, apples, drinks water all day long, and will go to [employer] for breakfast 2 times a week to get a steak, egg and cheese.  What exercise is being done to improve your Blood Pressure Control?   Patient reports before the pandemic him and his wife would go to [employer] at least 3 to 4 times a week; with his lower back problems he only walks on Treadmill, rides indoor exercise bike, and lift with dumbbells in his basement when he is physically able.  Adherence Review: Is the patient currently on ACE/ARB medication? Yes Does the patient have >5 day gap between last estimated fill dates? No    Lipid Panel    Component Value Date/Time   CHOL 150 01/10/2020 1224   TRIG 79 01/10/2020 1224   HDL 49 01/10/2020 1224   LDLCALC 86 01/10/2020 1224    10-year ASCVD risk score: The 10-year ASCVD risk score Mikey Bussing DC Brooke Bonito., et al., 2013) is: 22%   Values used to calculate the score:     Age: 52 years     Sex: Male     Is Non-Hispanic African American: Yes     Diabetic: Yes     Tobacco smoker: No     Systolic  Blood Pressure: 129 mmHg     Is BP treated: No     HDL Cholesterol: 49 mg/dL     Total Cholesterol: 150 mg/dL   Current antihyperlipidemic regimen:  ? Simvastatin 40mg  daily (every evening)   Previous antihyperlipidemic medications tried: None   ASCVD risk enhancing conditions: age >71, DM and HTN    What recent interventions/DTPs have been made by any provider to improve Cholesterol control since last CPP Visit:  - Recent Interventions was to exercise for 30 minutes daily 5 times per week (150 minutes total).  - Patient reports he is taking cholesterol medication as directed.   Any recent hospitalizations or ED visits since last visit with CPP? No    What diet changes have been made to improve Cholesterol?  o Patient reports he does not eat pork. Patient states he likes to eat salads with cucumber, tomato, cranberries, pecans, and walnuts; sometimes add [place] meat slices. Patient also states he eats oatmeal with raisins, Jell-O with mixed fruit, bananas, apples, drinks water all day long, and will go to [employer] for breakfast 2 times a week to get a steak, egg and cheese.   What exercise is being done to improve Cholesterol?  o Patient reports before the pandemic him and his wife would go to [employer] at least 3 to 4 times a week; with his lower back problems he only walks on Treadmill, rides indoor exercise bike, and lift with dumbbells in his basement when he is physically able.  Adherence Review: Does the patient have >5 day gap between last estimated fill dates? No    Goals Addressed            This Visit's Progress    Pharmacy Care Plan   On track    CARE PLAN ENTRY (see longitudinal plan of care for additional care plan information)  Current Barriers:   Chronic Disease Management support, education, and care coordination needs related to Hypertension, Hyperlipidemia, and Diabetes   Hypertension BP Readings from Last 3 Encounters:  10/14/19 140/78   07/08/19 128/80  07/08/19 128/80    Pharmacist Clinical Goal(s): o Over the next 90 days, patient will work with PharmD and providers to maintain BP goal <130/80  Current regimen:  o Olmesartan/Amlodipine/HCTZ 40/10/12.5mg  daily  Interventions: o Provided dietary and exercise recommendations o Discussed appropriate goal for blood pressure (less than 130/80) o Patient complained of swelling - Recommended patient elevate legs - Consider compression stockings - Could consider increasing diuretic if patient is retaining fluid - Advised patient to discuss with PCP at office visit next week  Patient self care activities - Over the next 90 days, patient will: o Check BP once-twice weekly, document, and provide at future appointments o Ensure daily salt intake < 2300 mg/day o Elevate legs to help with swelling and consider using compression stockings o Discuss swelling with PCP at office visit next week o Exercise for 30 minutes daily 5 times per week (150 minutes total)  Hyperlipidemia Lab Results  Component Value Date/Time   LDLCALC 75 07/08/2019 03:38 PM    Pharmacist Clinical Goal(s): o Over the next 90 days, patient will work with PharmD and providers to achieve LDL goal < 70  Current regimen:  o Simvastatin 40mg  daily every evening  Interventions: o Provided dietary and exercise recommendations o Discussed appropriate goals for LDL (less than 70), HDL (greater than 40), and triglycerides (less than 150)  Patient self care activities - Over the next 90 days, patient will: o Exercise for 30 minutes daily 5 times per week (150 minutes total) o Taking medication every evening on a schedule  Diabetes Lab Results  Component Value Date/Time   HGBA1C 6.9 (H) 07/08/2019 02:07 PM   HGBA1C 6.8 (H) 12/30/2018 02:17 PM    Pharmacist Clinical Goal(s): o Over the next 180 days, patient will work with PharmD and providers to maintain A1c goal <7%  Current regimen:   o Metformin 500mg  2 tablets twice daily with morning and evening meal  Interventions: o Provided dietary and exercise recommendations o Discussed appropriate goal for fasting blood sugar (80-130) o Discussed hypoglycemia treatment  Patient self care activities - Over the next 180 days, patient will: o Check blood sugar twice weekly, document, and provide at future appointments o Contact provider with any episodes of hypoglycemia o Exercise for 30 minutes daily 5 times per week (150 minutes total)  Medication management  Pharmacist Clinical Goal(s): o Over the next 90 days, patient will work with PharmD and providers to maintain optimal medication adherence  Current pharmacy: [name]  Interventions o Comprehensive medication review performed. o Continue current medication management strategy  Patient self care activities - Over the next 90 days, patient will: o Focus on medication adherence by utilizing pill box o Take medications as prescribed o Report any questions or concerns to PharmD and/or provider(s)  Initial goal documentation        Follow-Up:  Pharmacist Review-Notified [name], CPP.   Raynelle Highland, [place] Pharmacist Assistant [phone]

## 2020-02-06 ENCOUNTER — Other Ambulatory Visit: Payer: Self-pay

## 2020-02-06 ENCOUNTER — Ambulatory Visit
Admission: RE | Admit: 2020-02-06 | Discharge: 2020-02-06 | Disposition: A | Discharge status: Home / Self Care | Payer: PPO | Source: Ambulatory Visit | Attending: Orthopaedic Surgery | Admitting: Orthopaedic Surgery

## 2020-02-06 DIAGNOSIS — M545 Low back pain, unspecified: Secondary | ICD-10-CM

## 2020-02-06 DIAGNOSIS — M48061 Spinal stenosis, lumbar region without neurogenic claudication: Secondary | ICD-10-CM | POA: Diagnosis not present

## 2020-02-06 DIAGNOSIS — G8929 Other chronic pain: Secondary | ICD-10-CM

## 2020-02-11 ENCOUNTER — Encounter: Payer: Self-pay | Admitting: Nurse Practitioner

## 2020-02-16 ENCOUNTER — Encounter: Payer: Self-pay | Admitting: Orthopaedic Surgery

## 2020-02-16 ENCOUNTER — Ambulatory Visit (INDEPENDENT_AMBULATORY_CARE_PROVIDER_SITE_OTHER): Payer: PPO | Admitting: Orthopaedic Surgery

## 2020-02-16 DIAGNOSIS — M48061 Spinal stenosis, lumbar region without neurogenic claudication: Secondary | ICD-10-CM | POA: Diagnosis not present

## 2020-02-16 NOTE — Progress Notes (Signed)
Office Visit Note   Patient: Ricky Shields           Date of Birth: 02/26/1945           MRN: [id number] Visit Date: 02/16/2020              Requested by: [name], [address] [place],  [place] [address] PCP: [name], FNP   Assessment & Plan: Visit Diagnoses:  1. Spinal stenosis of lumbar region, unspecified whether neurogenic claudication present     Plan: Patient not exactly sure how far he can ambulate where he has to stop.  He does have back pain that radiates into his buttocks.  We will ask him to go out and walk and see what type of distance he is able to cover up for rest stops at rest.  He notes that he has pain but is not really sure how long he is able to stand and he will return in a couple weeks and he will try to keep at least a minimal record of how long he can stand or walk.  We discussed decompression surgery for spinal stenosis and indications for surgery.  He will try to find a traction place he can ambulate to see what distance he develops claudication.   Follow-Up Instructions: Return in about 2 weeks (around 03/01/2020).   Orders:  No orders of the defined types were placed in this encounter.  No orders of the defined types were placed in this encounter.     Procedures: No procedures performed   Clinical Data: No additional findings.   Subjective: Chief Complaint  Patient presents with   Lower Back - Pain, Follow-up    MRI review    HPI     75 year old male returns with chronic back pain right buttocks pain tingling into his right leg.  Patient states pain is not really getting better.  He has had previous IM Toradol injections taking Aleve, Tylenol.  Patient does have hypertension and type 2 diabetes.  MRI scan obtained 02/06/2020 shows some severe stenosis at L3-4 and L4-5 level.  He does have some right foraminal impingement at L3-4 and L4-5.  Left foraminal impingement at L 4-5, and L5-S1.    Review of Systems all  other systems are noncontributory to HPI other than as mentioned above.   Objective: Vital Signs: BP (!) 138/55    Pulse 93    Ht 5\' 9"  (1.753 m)    Wt 160 lb (72.6 kg)    BMI 23.63 kg/m   Physical Exam Constitutional:      Appearance: He is well-developed and well-nourished.  HENT:     Head: Normocephalic and atraumatic.  Eyes:     Extraocular Movements: EOM normal.     Pupils: Pupils are equal, round, and reactive to light.  Neck:     Thyroid: No thyromegaly.     Trachea: No tracheal deviation.  Cardiovascular:     Rate and Rhythm: Normal rate.  Pulmonary:     Effort: Pulmonary effort is normal.     Breath sounds: No wheezing.  Abdominal:     General: Bowel sounds are normal.     Palpations: Abdomen is soft.  Skin:    General: Skin is warm and dry.     Capillary Refill: Capillary refill takes less than 2 seconds.  Neurological:     Mental Status: He is alert and oriented to person, place, and time.  Psychiatric:  Mood and Affect: Mood and affect normal.        Behavior: Behavior normal.        Thought Content: Thought content normal.        Judgment: Judgment normal.     Ortho Exam patient pain with straight leg raising on the right 70 degrees.  There is some EHL weakness on the right and trace anterior tib weakness on the left.  He is able to walk on his toes has problems when he tries to heel walk.  Distal pulses are palpable.  Specialty Comments:  No specialty comments available.  Imaging: CLINICAL DATA:  Lateral recess stenosis and right L5 impingement.  EXAM: MRI LUMBAR SPINE WITHOUT CONTRAST  TECHNIQUE: Multiplanar, multisequence MR imaging of the lumbar spine was performed. No intravenous contrast was administered.  COMPARISON:  09/16/2014  FINDINGS: Segmentation:  5 lumbar type vertebrae  Alignment: Degenerative straightening of the lumbar spine with borderline retrolisthesis at L2-3 and L5-S1  Vertebrae: Minor discogenic endplate  edematous signal at L4-5 and L5-S1. L4 superior endplate Schmorl's node with mild marrow edema, new from prior.  Conus medullaris and cauda equina: Conus extends to the L2 level. Conus appears normal. There is cauda equina redundancy due to the degree of severe spinal stenosis.  Paraspinal and other soft tissues: Left pelvic kidney.  Disc levels:  Degenerative changes are exacerbated by short pedicles.  T12- L1: Unremarkable.  L1-L2: Spondylosis with mild disc narrowing and bulging.  L2-L3: Disc narrowing and bulging eccentric to the right where there is a paracentral protrusion and buttressing osteophyte that is chronic. Chronic right L3 nerve root compression. Moderate spinal stenosis.  L3-L4: Disc narrowing and bulging with an upward pointing protrusion. Facet osteoarthritis with spurring and ligamentum flavum thickening. Severe spinal stenosis. Advanced right foraminal impingement.  L4-L5: Disc narrowing and bulging. Degenerative facet spurring and ligamentum flavum thickening. Advanced spinal stenosis. Advanced foraminal impingement on the left more than right. On the left, there has been a foraminal herniation since prior.  L5-S1:Disc collapse and endplate degeneration with endplate ridging. Facet osteoarthritis with spurring on the left more than right. Moderate left foraminal narrowing  IMPRESSION: 1. Disc and facet degeneration exacerbated by short pedicles. 2. L3-4 and L4-5 severe spinal stenosis. 3. L2-3 advanced right subarticular recess stenosis with L3 compression. 4. Right foraminal impingement at L3-4 and L4-5. 5. Left foraminal impingement at L4-5 and L5-S1. The left L4-5 foramen is notable for advanced impingement and interval herniation.   Electronically Signed   By: Monte Fantasia M.D.   On: 02/06/2020 09:12    PMFS History: Patient Active Problem List   Diagnosis Date Noted   Spinal stenosis of lumbar region 02/17/2020    Tingling of skin 03/20/2018   Type 2 diabetes mellitus without complication, without long-term current use of insulin ([place]) 12/19/2017   Essential hypertension 12/19/2017   Past Medical History:  Diagnosis Date   Back pain    intermittent-prior military injury   BPH (benign prostatic hypertrophy) with urinary retention    Diabetes mellitus without complication (HCC)    GERD (gastroesophageal reflux disease)    High cholesterol    Hypertension     Family History  Problem Relation Age of Onset   Stroke Mother    Stroke Father     Past Surgical History:  Procedure Laterality Date   COLONOSCOPY     GREEN LIGHT LASER TURP (TRANSURETHRAL RESECTION OF PROSTATE N/A 10/21/2014   Procedure: GREEN LIGHT LASER TURP (TRANSURETHRAL RESECTION OF PROSTATE;  Surgeon: Festus Aloe, MD;  Location: [facility];  Service: Urology;  Laterality: N/A;   Social History   Occupational History   Occupation: retired  Tobacco Use   Smoking status: Never Smoker   Smokeless tobacco: Never Used  Scientific laboratory technician Use: Never used  Substance and Sexual Activity   Alcohol use: No    Alcohol/week: 0.0 standard drinks   Drug use: No   Sexual activity: Yes

## 2020-02-17 ENCOUNTER — Ambulatory Visit (INDEPENDENT_AMBULATORY_CARE_PROVIDER_SITE_OTHER): Payer: PPO | Admitting: Nurse Practitioner

## 2020-02-17 ENCOUNTER — Encounter: Payer: Self-pay | Admitting: Nurse Practitioner

## 2020-02-17 ENCOUNTER — Other Ambulatory Visit: Payer: Self-pay

## 2020-02-17 VITALS — BP 124/78 | HR 83 | Temp 97.6°F | Ht 69.0 in | Wt 163.0 lb

## 2020-02-17 DIAGNOSIS — M48061 Spinal stenosis, lumbar region without neurogenic claudication: Secondary | ICD-10-CM | POA: Insufficient documentation

## 2020-02-17 DIAGNOSIS — E119 Type 2 diabetes mellitus without complications: Secondary | ICD-10-CM

## 2020-02-17 DIAGNOSIS — G8929 Other chronic pain: Secondary | ICD-10-CM

## 2020-02-17 DIAGNOSIS — M545 Low back pain, unspecified: Secondary | ICD-10-CM | POA: Diagnosis not present

## 2020-02-17 MED ORDER — EMPAGLIFLOZIN 10 MG PO TABS: 10.0000 mg | ORAL_TABLET | Freq: Every day | ORAL | 5 refills | Status: DC

## 2020-02-17 NOTE — Patient Instructions (Signed)
Diabetes Mellitus and Foot Care Foot care is an important part of your health, especially when you have diabetes. Diabetes may cause you to have problems because of poor blood flow (circulation) to your feet and legs, which can cause your skin to:  Become thinner and drier.  Break more easily.  Heal more slowly.  Peel and crack. You may also have nerve damage (neuropathy) in your legs and feet, causing decreased feeling in them. This means that you may not notice minor injuries to your feet that could lead to more serious problems. Noticing and addressing any potential problems early is the best way to prevent future foot problems. How to care for your feet Foot hygiene  Wash your feet daily with warm water and mild soap. Do not use hot water. Then, pat your feet and the areas between your toes until they are completely dry. Do not soak your feet as this can dry your skin.  Trim your toenails straight across. Do not dig under them or around the cuticle. File the edges of your nails with an emery board or nail file.  Apply a moisturizing lotion or petroleum jelly to the skin on your feet and to dry, brittle toenails. Use lotion that does not contain alcohol and is unscented. Do not apply lotion between your toes.   Shoes and socks  Wear clean socks or stockings every day. Make sure they are not too tight. Do not wear knee-high stockings since they may decrease blood flow to your legs.  Wear shoes that fit properly and have enough cushioning. Always look in your shoes before you put them on to be sure there are no objects inside.  To break in new shoes, wear them for just a few hours a day. This prevents injuries on your feet. Wounds, scrapes, corns, and calluses  Check your feet daily for blisters, cuts, bruises, sores, and redness. If you cannot see the bottom of your feet, use a mirror or ask someone for help.  Do not cut corns or calluses or try to remove them with medicine.  If you  find a minor scrape, cut, or break in the skin on your feet, keep it and the skin around it clean and dry. You may clean these areas with mild soap and water. Do not clean the area with peroxide, alcohol, or iodine.  If you have a wound, scrape, corn, or callus on your foot, look at it several times a day to make sure it is healing and not infected. Check for: ? Redness, swelling, or pain. ? Fluid or blood. ? Warmth. ? Pus or a bad smell.   General tips  Do not cross your legs. This may decrease blood flow to your feet.  Do not use heating pads or hot water bottles on your feet. They may burn your skin. If you have lost feeling in your feet or legs, you may not know this is happening until it is too late.  Protect your feet from hot and cold by wearing shoes, such as at the beach or on hot pavement.  Schedule a complete foot exam at least once a year (annually) or more often if you have foot problems. Report any cuts, sores, or bruises to your health care provider immediately. Where to find more information  American Diabetes Association: www.diabetes.org  Association of Diabetes Care & Education Specialists: www.diabeteseducator.org Contact a health care provider if:  You have a medical condition that increases your risk of infection and   you have any cuts, sores, or bruises on your feet.  You have an injury that is not healing.  You have redness on your legs or feet.  You feel burning or tingling in your legs or feet.  You have pain or cramps in your legs and feet.  Your legs or feet are numb.  Your feet always feel cold.  You have pain around any toenails. Get help right away if:  You have a wound, scrape, corn, or callus on your foot and: ? You have pain, swelling, or redness that gets worse. ? You have fluid or blood coming from the wound, scrape, corn, or callus. ? Your wound, scrape, corn, or callus feels warm to the touch. ? You have pus or a bad smell coming from  the wound, scrape, corn, or callus. ? You have a fever. ? You have a red line going up your leg. Summary  Check your feet every day for blisters, cuts, bruises, sores, and redness.  Apply a moisturizing lotion or petroleum jelly to the skin on your feet and to dry, brittle toenails.  Wear shoes that fit properly and have enough cushioning.  If you have foot problems, report any cuts, sores, or bruises to your health care provider immediately.  Schedule a complete foot exam at least once a year (annually) or more often if you have foot problems. This information is not intended to replace advice given to you by your health care provider. Make sure you discuss any questions you have with your health care provider. Document Revised: 07/22/2019 Document Reviewed: 07/22/2019 Elsevier Patient Education  2021 Elsevier Inc.  

## 2020-02-17 NOTE — Progress Notes (Signed)
Rutherford Nail as a scribe for [name], FNP.,have documented all relevant documentation on the behalf of [name], FNP,as directed by  [name], FNP while in the presence of [name], [place]. This visit occurred during the SARS-CoV-2 public health emergency.  Safety protocols were in place, including screening questions prior to the visit, additional usage of staff PPE, and extensive cleaning of exam room while observing appropriate contact time as indicated for disinfecting solutions.  Subjective:     Patient ID: Ricky Shields , male    DOB: 07-24-1945 , 75 y.o.   MRN: [id number]   Chief Complaint  Patient presents with  . Diabetes    HPI  Patient here for new medication follow up of Jardiance. He is tolerating well.    He was seen by Dr. Allyson Sabal had area removed from his chin, he went to the surgical center on [address] - and cleaned it out better. Thought to be a cyst - benign.    He has also seen Dr. [name] for his low back pain, had an MRI done, he was encouraged to start back walking. He has a follow up in 2 weeks.      Past Medical History:  Diagnosis Date  . Back pain    intermittent-prior military injury  . BPH (benign prostatic hypertrophy) with urinary retention   . Diabetes mellitus without complication ([place])   . GERD (gastroesophageal reflux disease)   . High cholesterol   . Hypertension      Family History  Problem Relation Age of Onset  . Stroke Mother   . Stroke Father      Current Outpatient Medications:  .  acetaminophen (TYLENOL) 500 MG tablet, Take 1 tablet (500 mg total) by mouth every 6 (six) hours as needed., Disp: 30 tablet, Rfl: 0 .  aspirin EC 81 MG tablet, Take 81 mg by mouth daily. Swallow whole., Disp: , Rfl:  .  Cholecalciferol (VITAMIN D) 50 MCG (2000 UT) tablet, Take 2,000 Units by mouth daily. , Disp: , Rfl:  .  glucose blood (ONETOUCH ULTRA) test strip, Use as instructed, Disp: 100 each, Rfl: 2 .  glucose blood  test strip, Use as instructed to test blood sugars three times a day DX:E11.65, Disp: 100 each, Rfl: 12 .  metFORMIN (GLUCOPHAGE) 1000 MG tablet, Take 1 tablet (1,000 mg total) by mouth 2 (two) times daily with a meal., Disp: 180 tablet, Rfl: 1 .  mometasone (NASONEX) 50 MCG/ACT nasal spray, Place 2 sprays into the nose daily., Disp: 17 g, Rfl: 2 .  Multiple Vitamin (MULTIVITAMIN WITH MINERALS) TABS tablet, Take 1 tablet by mouth daily. One a Day Men's, Disp: , Rfl:  .  Multiple Vitamins-Minerals (OCUVITE PRESERVISION PO), Take 1 tablet by mouth daily., Disp: , Rfl:  .  ONE TOUCH ULTRA TEST test strip, , Disp: , Rfl: 0 .  simvastatin (ZOCOR) 40 MG tablet, TAKE 1 TABLET(40 MG) BY MOUTH EVERY EVENING, Disp: 90 tablet, Rfl: 0 .  tamsulosin (FLOMAX) 0.4 MG CAPS capsule, Take 0.4 mg by mouth daily., Disp: , Rfl: 5 .  traMADol (ULTRAM) 50 MG tablet, Take 1 tablet (50 mg total) by mouth every 12 (twelve) hours as needed., Disp: 30 tablet, Rfl: 0 .  valACYclovir (VALTREX) 500 MG tablet, Take 500 mg by mouth daily., Disp: , Rfl:  .  empagliflozin (JARDIANCE) 10 MG TABS tablet, Take 1 tablet (10 mg total) by mouth daily before breakfast., Disp: 30 tablet, Rfl: 5 .  Olmesartan-amLODIPine-HCTZ  40-10-12.5 MG TABS, TAKE 1 TABLET BY MOUTH DAILY, Disp: 90 tablet, Rfl: 0 .  Olmesartan-amLODIPine-HCTZ 40-10-12.5 MG TABS, TAKE 1 TABLET BY MOUTH DAILY, Disp: 90 tablet, Rfl: 0   Allergies  Allergen Reactions  . Penicillins Rash    Has patient had a PCN reaction causing immediate rash, facial/tongue/throat swelling, SOB or lightheadedness with hypotension: No Has patient had a PCN reaction causing severe rash involving mucus membranes or skin necrosis: No Has patient had a PCN reaction that required hospitalization No Has patient had a PCN reaction occurring within the last 10 years: No If all of the above answers are "NO", then may proceed with Cephalosporin use.      Review of Systems  Constitutional:  Negative.   HENT: Negative.   Respiratory: Negative.   Cardiovascular: Negative.  Negative for leg swelling.  Musculoskeletal: Negative.   Skin: Negative.   Psychiatric/Behavioral: Negative.      Today's Vitals   02/17/20 1050  BP: 124/78  Pulse: 83  Temp: 97.6 F (36.4 C)  TempSrc: Oral  Weight: 163 lb (73.9 kg)  Height: '5\' 9"'  (1.753 m)  PainSc: 9   PainLoc: Back   Body mass index is 24.07 kg/m.   Objective:  Physical Exam Vitals reviewed.  Constitutional:      General: He is not in acute distress.    Appearance: Normal appearance.  Cardiovascular:     Pulses: Normal pulses.     Heart sounds: Normal heart sounds. No murmur heard.   Pulmonary:     Effort: Pulmonary effort is normal. No respiratory distress.     Breath sounds: Normal breath sounds. No wheezing.  Neurological:     General: No focal deficit present.     Mental Status: He is alert and oriented to person, place, and time.     Cranial Nerves: No cranial nerve deficit.  Psychiatric:        Mood and Affect: Mood normal.        Behavior: Behavior normal.        Thought Content: Thought content normal.        Judgment: Judgment normal.         Assessment And Plan:     1. Type 2 diabetes mellitus without complication, without long-term current use of insulin (HCC)  Chronic, he is tolerating jardiance well.  Continue with current dose. - Hemoglobin A1c; Future - BMP8+EGFR; Future - empagliflozin (JARDIANCE) 10 MG TABS tablet; Take 1 tablet (10 mg total) by mouth daily before breakfast.  Dispense: 30 tablet; Refill: 5  2. Chronic right-sided low back pain without sciatica  Continue folllow up with Dr. [name] and I too encourage him to start back walking regularly   Patient was given opportunity to ask questions. Patient verbalized understanding of the plan and was able to repeat key elements of the plan. All questions were answered to their satisfaction.  [name], FNP     I, [name],  FNP, have reviewed all documentation for this visit. The documentation on 02/17/20 for the exam, diagnosis, procedures, and orders are all accurate and complete.    THE PATIENT IS ENCOURAGED TO PRACTICE SOCIAL DISTANCING DUE TO THE COVID-19 PANDEMIC.

## 2020-02-17 NOTE — Progress Notes (Deleted)
I,Ricky Shields,acting as a Education administrator for [employer], FNP.,have documented all relevant documentation on the behalf of Ricky Brine, FNP,as directed by  Ricky Brine, FNP while in the presence of Ricky Shields, [place].  This visit occurred during the SARS-CoV-2 public health emergency.  Safety protocols were in place, including screening questions prior to the visit, additional usage of staff PPE, and extensive cleaning of exam room while observing appropriate contact time as indicated for disinfecting solutions.  Subjective:     Patient ID: Ricky Shields , male    DOB: 1945/02/06 , 75 y.o.   MRN: [id number]   Chief Complaint  Patient presents with  . Hypertension  . Diabetes    HPI  Patient here for a blood pressure check and diabetes f/u.       Diabetes He presents for his follow-up diabetic visit. He has type 2 diabetes mellitus. His disease course has been stable. There are no hypoglycemic associated symptoms. Pertinent negatives for hypoglycemia include no dizziness or headaches. Pertinent negatives for diabetes include no blurred vision, no chest pain, no fatigue, no polydipsia, no polyphagia and no polyuria. There are no hypoglycemic complications. Symptoms are stable. There are no diabetic complications. Risk factors for coronary artery disease include male sex and sedentary lifestyle. Current diabetic treatment includes oral agent (monotherapy). He is compliant with treatment all of the time. His weight is stable. He is following a diabetic diet. When asked about meal planning, he reported none. He has not had a previous visit with a dietitian. He participates in exercise intermittently. An ACE inhibitor/angiotensin II receptor blocker is being taken. He does not see a podiatrist.Eye exam is current (scheduled for tomorrow).  Hypertension This is a chronic problem. The current episode started more than 1 year ago. The problem is unchanged. The problem is controlled. Pertinent  negatives include no anxiety, blurred vision, chest pain, headaches or palpitations. There are no associated agents to hypertension. Risk factors for coronary artery disease include sedentary lifestyle and male gender. Past treatments include angiotensin blockers and diuretics. The current treatment provides no improvement. There are no compliance problems.  There is no history of angina. There is no history of chronic renal disease.     Past Medical History:  Diagnosis Date  . Back pain    intermittent-prior military injury  . BPH (benign prostatic hypertrophy) with urinary retention   . Diabetes mellitus without complication ([place])   . GERD (gastroesophageal reflux disease)   . High cholesterol   . Hypertension      Family History  Problem Relation Age of Onset  . Stroke Mother   . Stroke Father      Current Outpatient Medications:  .  acetaminophen (TYLENOL) 500 MG tablet, Take 1 tablet (500 mg total) by mouth every 6 (six) hours as needed., Disp: 30 tablet, Rfl: 0 .  aspirin EC 81 MG tablet, Take 81 mg by mouth daily. Swallow whole., Disp: , Rfl:  .  Cholecalciferol (VITAMIN D) 50 MCG (2000 UT) tablet, Take 2,000 Units by mouth daily. , Disp: , Rfl:  .  empagliflozin (JARDIANCE) 10 MG TABS tablet, Take 1 tablet (10 mg total) by mouth daily before breakfast., Disp: 30 tablet, Rfl: 2 .  glucose blood (ONETOUCH ULTRA) test strip, Use as instructed, Disp: 100 each, Rfl: 2 .  glucose blood test strip, Use as instructed to test blood sugars three times a day DX:E11.65, Disp: 100 each, Rfl: 12 .  metFORMIN (GLUCOPHAGE) 1000 MG tablet, Take 1 tablet (  1,000 mg total) by mouth 2 (two) times daily with a meal., Disp: 180 tablet, Rfl: 1 .  mometasone (NASONEX) 50 MCG/ACT nasal spray, Place 2 sprays into the nose daily., Disp: 17 g, Rfl: 2 .  Multiple Vitamin (MULTIVITAMIN WITH MINERALS) TABS tablet, Take 1 tablet by mouth daily. One a Day Men's, Disp: , Rfl:  .  Multiple Vitamins-Minerals  (OCUVITE PRESERVISION PO), Take 1 tablet by mouth daily., Disp: , Rfl:  .  Olmesartan-amLODIPine-HCTZ 40-10-12.5 MG TABS, TAKE 1 TABLET BY MOUTH DAILY, Disp: 90 tablet, Rfl: 0 .  ONE TOUCH ULTRA TEST test strip, , Disp: , Rfl: 0 .  simvastatin (ZOCOR) 40 MG tablet, TAKE 1 TABLET(40 MG) BY MOUTH EVERY EVENING, Disp: 90 tablet, Rfl: 0 .  tamsulosin (FLOMAX) 0.4 MG CAPS capsule, Take 0.4 mg by mouth daily., Disp: , Rfl: 5 .  traMADol (ULTRAM) 50 MG tablet, Take 1 tablet (50 mg total) by mouth every 12 (twelve) hours as needed., Disp: 30 tablet, Rfl: 0 .  valACYclovir (VALTREX) 500 MG tablet, Take 500 mg by mouth daily., Disp: , Rfl:    Allergies  Allergen Reactions  . Penicillins Rash    Has patient had a PCN reaction causing immediate rash, facial/tongue/throat swelling, SOB or lightheadedness with hypotension: No Has patient had a PCN reaction causing severe rash involving mucus membranes or skin necrosis: No Has patient had a PCN reaction that required hospitalization No Has patient had a PCN reaction occurring within the last 10 years: No If all of the above answers are "NO", then may proceed with Cephalosporin use.      Review of Systems  Constitutional: Negative.  Negative for fatigue.  Eyes: Negative for blurred vision.  Respiratory: Negative.   Cardiovascular: Negative.  Negative for chest pain and palpitations.  Gastrointestinal: Negative.   Endocrine: Negative for polydipsia, polyphagia and polyuria.  Musculoskeletal: Positive for back pain.  Neurological: Negative for dizziness and headaches.  Psychiatric/Behavioral: Negative.   All other systems reviewed and are negative.    Today's Vitals   02/17/20 1050  BP: 124/78  Pulse: 83  Temp: 97.6 F (36.4 C)  TempSrc: Oral  Weight: 163 lb (73.9 kg)  Height: 5\' 9"  (1.753 m)  PainSc: 9   PainLoc: Back   Body mass index is 24.07 kg/m.  Wt Readings from Last 3 Encounters:  02/17/20 163 lb (73.9 kg)  02/16/20 160 lb (72.6  kg)  01/12/20 160 lb (72.6 kg)   Objective:  Physical Exam Vitals and nursing note reviewed.  Constitutional:      Appearance: Normal appearance. He is obese.  HENT:     Head: Normocephalic and atraumatic.  Cardiovascular:     Rate and Rhythm: Normal rate and regular rhythm.     Heart sounds: Normal heart sounds.  Pulmonary:     Breath sounds: Normal breath sounds.  Skin:    General: Skin is warm.  Neurological:     General: No focal deficit present.     Mental Status: He is alert and oriented to person, place, and time.         Assessment And Plan:     1. Type 2 diabetes mellitus without complication, without long-term current use of insulin ([place])  2. Essential hypertension     Patient was given opportunity to ask questions. Patient verbalized understanding of the plan and was able to repeat key elements of the plan. All questions were answered to their satisfaction.  Michelle Nasuti, CMA   I, Caleen Jobs  Yakov Bergen, CMA, have reviewed all documentation for this visit. The documentation on 02/17/20 for the exam, diagnosis, procedures, and orders are all accurate and complete.  THE PATIENT IS ENCOURAGED TO PRACTICE SOCIAL DISTANCING DUE TO THE COVID-19 PANDEMIC.

## 2020-02-22 ENCOUNTER — Other Ambulatory Visit: Payer: Self-pay | Admitting: Nurse Practitioner

## 2020-02-23 ENCOUNTER — Telehealth: Payer: Self-pay

## 2020-02-23 NOTE — Progress Notes (Signed)
02/23/20-Left a voicemail notifying the patient of upcoming CCM Call appointment on 02/24/20 at 10:00 am with [name], CPP and to have medications and supplements near during phone visit.  [name], CPP Notified.  Raynelle Highland, [place] Pharmacist Assistant [phone]

## 2020-02-24 ENCOUNTER — Ambulatory Visit (INDEPENDENT_AMBULATORY_CARE_PROVIDER_SITE_OTHER): Payer: PPO

## 2020-02-24 DIAGNOSIS — E119 Type 2 diabetes mellitus without complications: Secondary | ICD-10-CM | POA: Diagnosis not present

## 2020-02-24 DIAGNOSIS — G8929 Other chronic pain: Secondary | ICD-10-CM

## 2020-02-24 DIAGNOSIS — I10 Essential (primary) hypertension: Secondary | ICD-10-CM

## 2020-02-24 DIAGNOSIS — M25559 Pain in unspecified hip: Secondary | ICD-10-CM

## 2020-02-24 DIAGNOSIS — M545 Low back pain, unspecified: Secondary | ICD-10-CM

## 2020-02-24 NOTE — Progress Notes (Signed)
Chronic Care Management Pharmacy Note  03/08/2020 Name:  Ricky Shields MRN:  [id number] DOB:  [birth year]  Subjective: Ricky Shields,  75 y.o. , male presents for his Follow-Up CCM visit with the clinical pharmacist via telephone. Patient went to see Dr. [name] a doctor for his back. The doctor would like him to start walking again. He wants him to go back outside and start walking. He is going to walk starting today   Engaged with patient by telephone for follow up visit in response to provider referral for pharmacy case management and/or care coordination services.   Consent to Services:  The patient was given the following information about Chronic Care Management services today, agreed to services, and gave verbal consent: 1. CCM service includes personalized support from designated clinical staff supervised by the primary care provider, including individualized plan of care and coordination with other care providers 2. 24/7 contact phone numbers for assistance for urgent and routine care needs. 3. Service will only be billed when office clinical staff spend 20 minutes or more in a month to coordinate care. 4. Only one practitioner may furnish and bill the service in a calendar month. 5.The patient may stop CCM services at any time (effective at the end of the month) by phone call to the office staff. 6. The patient will be responsible for cost sharing (co-pay) of up to 20% of the service fee (after annual deductible is met). Patient agreed to services and consent obtained.  Patient Care Team: [name], FNP as PCP - General (General Practice) Mayford Knife, [facility] (Pharmacist)   Office Visits: 02/17/2020  PCP OV   Consult Visit: 01/12/2020 Ortho Visit   Hospital visits: None in previous 6 months  Objective:  Lab Results  Component Value Date   CREATININE 1.19 01/10/2020   BUN 17 01/10/2020   GFRNONAA 60 01/10/2020   GFRAA 70 01/10/2020   NA 141 01/10/2020   K 4.4  01/10/2020   CALCIUM 9.8 01/10/2020   CO2 22 01/10/2020    Lab Results  Component Value Date/Time   HGBA1C 7.0 (H) 01/10/2020 12:24 PM   HGBA1C 7.1 (H) 10/14/2019 02:21 PM   MICROALBUR 30 07/08/2019 12:51 PM   MICROALBUR 80 06/25/2018 12:39 PM    Last diabetic Eye exam:  Lab Results  Component Value Date/Time   HMDIABEYEEXA No Retinopathy 12/30/2019 12:00 AM    Last diabetic Foot exam: No results found for: HMDIABFOOTEX   Lab Results  Component Value Date   CHOL 150 01/10/2020   HDL 49 01/10/2020   LDLCALC 86 01/10/2020   TRIG 79 01/10/2020   CHOLHDL 3.1 01/10/2020    Hepatic Function Latest Ref Rng & Units 10/14/2019 07/08/2019 12/30/2018  Total Protein 6.0 - 8.5 g/dL 7.2 7.6 7.2  Albumin 3.7 - 4.7 g/dL 4.6 4.7 4.6  AST 0 - 40 IU/L '19 22 22  ' ALT 0 - 44 IU/L '19 21 20  ' Alk Phosphatase 44 - 121 IU/L 71 81 85  Total Bilirubin 0.0 - 1.2 mg/dL 0.6 0.4 0.5    No results found for: TSH, FREET4  CBC Latest Ref Rng & Units 07/08/2019 06/25/2018 10/21/2014  WBC 3.4 - 10.8 x10E3/uL 6.4 5.5 -  Hemoglobin 13.0 - 17.7 g/dL 14.3 13.9 13.9  Hematocrit 37.5 - 51.0 % 39.8 37.6 41.0  Platelets 150 - 450 x10E3/uL 250 229 -    No results found for: VD25OH  Clinical ASCVD: No  The 10-year ASCVD risk score (Goff DC Jr.,  et al., 2013) is: 23.4%   Values used to calculate the score:     Age: 32 years     Sex: Male     Is Non-Hispanic African American: Yes     Diabetic: Yes     Tobacco smoker: No     Systolic Blood Pressure: 427 mmHg     Is BP treated: No     HDL Cholesterol: 49 mg/dL     Total Cholesterol: 150 mg/dL    Depression screen [facility] 2/9 07/08/2019 09/30/2018 06/25/2018  Decreased Interest 0 0 0  Down, Depressed, Hopeless 0 0 0  PHQ - 2 Score 0 0 0  Altered sleeping 0 - 0  Tired, decreased energy 0 - 0  Change in appetite 0 - 0  Feeling bad or failure about yourself  0 - 0  Trouble concentrating 0 - 0  Moving slowly or fidgety/restless 0 - 0  Suicidal thoughts 0 - 0   PHQ-9 Score 0 - 0  Difficult doing work/chores Not difficult at all - -      Social History   Tobacco Use  Smoking Status Never Smoker  Smokeless Tobacco Never Used   BP Readings from Last 3 Encounters:  03/07/20 132/60  02/17/20 124/78  02/16/20 (!) 138/55   Pulse Readings from Last 3 Encounters:  03/07/20 92  02/17/20 83  02/16/20 93   Wt Readings from Last 3 Encounters:  03/07/20 163 lb (73.9 kg)  02/17/20 163 lb (73.9 kg)  02/16/20 160 lb (72.6 kg)    Assessment/Interventions: Review of patient past medical history, allergies, medications, health status, including review of consultants reports, laboratory and other test data, was performed as part of comprehensive evaluation and provision of chronic care management services.   SDOH:  (Social Determinants of Health) assessments and interventions performed: No   CCM Care Plan  Allergies  Allergen Reactions  . Penicillins Rash    Has patient had a PCN reaction causing immediate rash, facial/tongue/throat swelling, SOB or lightheadedness with hypotension: No Has patient had a PCN reaction causing severe rash involving mucus membranes or skin necrosis: No Has patient had a PCN reaction that required hospitalization No Has patient had a PCN reaction occurring within the last 10 years: No If all of the above answers are "NO", then may proceed with Cephalosporin use.     Medications Reviewed Today    Reviewed by Carmine Savoy, RT (Technologist) on 03/07/20 at [id number]  Med List Status: <None>  Medication Order Taking? Sig Documenting Provider Last Dose Status Informant  acetaminophen (TYLENOL) 500 MG tablet [id number] Yes Take 1 tablet (500 mg total) by mouth every 6 (six) hours as needed. [name], FNP Taking Active   aspirin EC 81 MG tablet [id number] Yes Take 81 mg by mouth daily. Swallow whole. [provider] Taking Active   Cholecalciferol (VITAMIN D) 50 MCG (2000 UT) tablet [id number] Yes Take 2,000 Units  by mouth daily.  [provider] Taking Active Self  empagliflozin (JARDIANCE) 10 MG TABS tablet [id number] Yes Take 1 tablet (10 mg total) by mouth daily before breakfast. [name], FNP Taking Active   glucose blood (ONETOUCH ULTRA) test strip [id number] Yes Use as instructed [name], FNP Taking Active   glucose blood test strip [id number] Yes Use as instructed to test blood sugars three times a day DX:E11.65 [name], FNP Taking Active   metFORMIN (GLUCOPHAGE) 1000 MG tablet [id number] Yes Take 1 tablet (1,000 mg total) by mouth 2 (two) times daily  with a meal. [name], FNP Taking Active   mometasone (NASONEX) 50 MCG/ACT nasal spray [id number]  Place 2 sprays into the nose daily. [name], FNP  Expired 10/14/19 2359   Multiple Vitamin (MULTIVITAMIN WITH MINERALS) TABS tablet [id number] Yes Take 1 tablet by mouth daily. One a Day Men's [provider] Taking Active Self  Multiple Vitamins-Minerals (OCUVITE PRESERVISION PO) [id number] Yes Take 1 tablet by mouth daily. [provider] Taking Active   Olmesartan-amLODIPine-HCTZ 40-10-12.5 MG TABS [id number] Yes TAKE 1 TABLET BY MOUTH DAILY [name], FNP Taking Active   Olmesartan-amLODIPine-HCTZ 40-10-12.5 MG TABS [id number] Yes TAKE 1 TABLET BY MOUTH DAILY [name], FNP Taking Active   ONE TOUCH ULTRA TEST test strip [id number] Yes  [provider] Taking Active            Med Note Emily Filbert, MICHAEL B   Sat Jan 27, 2016 11:58 AM)    simvastatin (ZOCOR) 40 MG tablet [id number] Yes TAKE 1 TABLET(40 MG) BY MOUTH EVERY Fredric Mare, MD Taking Active   tamsulosin [facility]) 0.4 MG CAPS capsule [id number] Yes Take 0.4 mg by mouth daily. [provider] Taking Active            Med Note Valma Cava   HER Jan 27, 2016 11:57 AM)    traMADol (ULTRAM) 50 MG tablet [id number] Yes Take 1 tablet (50 mg total) by mouth every 12 (twelve) hours as needed. Marybelle Killings, MD Taking  Active   valACYclovir (VALTREX) 500 MG tablet [id number] Yes Take 500 mg by mouth daily. [provider] Taking Active           Patient Active Problem List   Diagnosis Date Noted  . Spinal stenosis of lumbar region 02/17/2020  . Tingling of skin 03/20/2018  . Type 2 diabetes mellitus without complication, without long-term current use of insulin ([place]) 12/19/2017  . Essential hypertension 12/19/2017    Immunization History  Administered Date(s) Administered  . Fluad Quad(high Dose 65+) 09/30/2018, 10/19/2019  . Influenza, High Dose Seasonal PF 10/10/2017  . Influenza-Unspecified 11/10/2017  . PFIZER(Purple Top)SARS-COV-2 Vaccination 02/19/2019, 03/12/2019, 12/14/2019  . Pneumococcal Conjugate-13 09/30/2018  . Pneumococcal-Unspecified 11/02/2012, 02/09/2014  . Tdap 11/02/2012    Conditions to be addressed/monitored:  Hypertension, Hyperlipidemia, Diabetes and Pain/Spinal Stenosis  Care Plan : [place]  Updates made by Mayford Knife, RPH since 03/08/2020 12:00 AM    Problem: HTN, HLD, DM, PAIN   Priority: High    Long-Range Goal: Disease Management   Start Date: 02/24/2020  This Visit's Progress: On track  Priority: High  Note:    Current Barriers:  . Does not adhere to prescribed medication regimen  Pharmacist Clinical Goal(s):  Marland Kitchen Over the next 90 days, patient will achieve adherence to monitoring guidelines and medication adherence to achieve therapeutic efficacy through collaboration with PharmD and provider.    Interventions: . 1:1 collaboration with [name], FNP regarding development and update of comprehensive plan of care as evidenced by provider attestation and co-signature . Inter-disciplinary care team collaboration (see longitudinal plan of care) . Comprehensive medication review performed; medication list updated in electronic medical record  Hypertension (BP goal <130/80) -controlled -Current  treatment: . Olmesartan-Amlodipine-HCTZ 40-10-12.5 mg - take 1 tablet by mouth daily  -Current home readings: 138/82, checking BP about once or twice a week   -Current exercise habits: Patients reports that he was walking 3-4 miles three times per week.  -Denies hypotensive/hypertensive symptoms -Educated on BP  goals and benefits of medications for prevention of heart attack, stroke and kidney damage; Daily salt intake goal < 2300 mg; Exercise goal of 150 minutes per week; Importance of home blood pressure monitoring; Proper BP monitoring technique; -Counseled to monitor BP at home at least once per week, document, and provide log at future appointments -Counseled on diet and exercise extensively Recommended to continue current medication  Hyperlipidemia: (LDL goal < 70) -uncontrolled -Current treatment: . Simvastatin 40 mg tablet daily  -Educated on Benefits of statin for ASCVD risk reduction; Importance of limiting foods high in cholesterol; Exercise goal of 150 minutes per week; -Counseled on diet and exercise extensively Recommended to continue current medication  Diabetes (A1c goal <7%) -uncontrolled -Current medications: . Jardiance 10 mg -taking 1 tablet daily  -Current home glucose readings . fasting glucose: 137, 120  -Denies hypoglycemic/hyperglycemic symptoms -Current meal patterns:  . breakfast: honey bun or frappuccino  . lunch: eating vegetables and protein . drinks: mostly water, he is no longer drinking soda and he does drink a sweet tea sometimes.  -Current exercise: Was walking twice per week for 3-4 miles. Now he is going to start walking again per Dr. [name] the ortho doctor.  -Educated onA1c and blood sugar goals; Complications of diabetes including kidney damage, retinal damage, and cardiovascular disease; Exercise goal of 150 minutes per week; Benefits of routine self-monitoring of blood sugar; -Counseled to check feet daily and get yearly eye  exams -Counseled on diet and exercise extensively Recommended to continue current medication Collaborated with PCP and patient fill out patient assistance form   Pain  (Goal: Tolerable Pain -4/5) -controlled -Current treatment  . Tramadol 50 mg tablet - take 1 tablet by mouth every 12 hours as needed . Acetaminophen 500 mg tablet- take 1 tablet by mouth every 6 hours as needed  -Recommended to continue current medication Counseled on continuing to see provider Dr. [name]  Follow up if you pain does not get better.  Patient stated that Dr. 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   Patient Goals/Self-Care Activities . Over the next 90  days, patient will:  - take medications as prescribed target a minimum of 150 minutes of moderate intensity exercise weekly  Follow Up Plan: Telephone follow up appointment with care management team member scheduled for: 06/07/2020 Next PCP appointment scheduled for:  04/10/2020      Medication Assistance: None required.  Patient affirms current coverage meets needs.  Patient's preferred pharmacy is:  [id number] [address], [address] [place] [address] Phone: [phone] Fax: [phone]  [facility] [phone] [place], [place] - [place] [address] Phone: [phone] Fax: [phone]  Uses pill box? Yes Pt endorses 100% compliance  We discussed: Benefits of medication synchronization, packaging and delivery as well as enhanced pharmacist oversight with [employer]. Patient decided to: Continue current medication management strategy  Care Plan and Follow Up Patient Decision:  Patient agrees to Care Plan and Follow-up.  Plan: Telephone follow up appointment with care management team member scheduled for:  06/07/2020 and Next PCP appointment scheduled for:  04/10/2020    [name], PharmD Clinical Pharmacist [facility] [phone]

## 2020-03-07 ENCOUNTER — Ambulatory Visit (INDEPENDENT_AMBULATORY_CARE_PROVIDER_SITE_OTHER): Payer: PPO | Admitting: Orthopaedic Surgery

## 2020-03-07 ENCOUNTER — Encounter: Payer: Self-pay | Admitting: Orthopaedic Surgery

## 2020-03-07 VITALS — BP 132/60 | HR 92 | Ht 69.0 in | Wt 163.0 lb

## 2020-03-07 DIAGNOSIS — M48061 Spinal stenosis, lumbar region without neurogenic claudication: Secondary | ICD-10-CM | POA: Diagnosis not present

## 2020-03-07 NOTE — Progress Notes (Signed)
Office Visit Note   Patient: Ricky Shields           Date of Birth: [birth year]           MRN: [id number] Visit Date: 03/07/2020              Requested by: [name], [address] [place],  [place] [address] PCP: [name], FNP   Assessment & Plan: Visit Diagnoses:  1. Spinal stenosis of lumbar region, unspecified whether neurogenic claudication present     Plan: Recheck 6 months continue good diabetic control discussed as he has been doing.  If he gets increase in symptoms or has claudication symptoms he can return.  We discussed two-level lumbar decompression surgery.  He has had some symptoms in his back and his right leg since he was in his 29s when he was in the [employer] in [place].  Recheck 6 months.  Follow-Up Instructions: Return in about 6 months (around 09/04/2020).   Orders:  No orders of the defined types were placed in this encounter.  No orders of the defined types were placed in this encounter.     Procedures: No procedures performed   Clinical Data: No additional findings.   Subjective: Chief Complaint  Patient presents with  . Lower Back - Follow-up, Pain    HPI 75 year old male returns with severe stenosis L3-4 and L4-5.  He states he went out walked and states he can make it 2 miles without having to stop or sit.  He can stand as long as he once at night he has increased pain he has had some pain that radiates from his back into his right thigh stops at the knee.  Has not noted any weakness.  He has some days that seem to be better.  He continues to have some tingling in his feet.  No weakness and he is able to walk up and down and a condo was on the third floor.  No problems with stairs or carrying objects.  Review of Systems most with diabetes A1c less than 7.2.  All other systems noncontributory to HPI.   Objective: Vital Signs: BP 132/60   Pulse 92   Ht 5\' 9"  (1.753 m)   Wt 163 lb (73.9 kg)   BMI 24.07 kg/m    Physical Exam Constitutional:      Appearance: He is well-developed and well-nourished.  HENT:     Head: Normocephalic and atraumatic.  Eyes:     Extraocular Movements: EOM normal.     Pupils: Pupils are equal, round, and reactive to light.  Neck:     Thyroid: No thyromegaly.     Trachea: No tracheal deviation.  Cardiovascular:     Rate and Rhythm: Normal rate.  Pulmonary:     Effort: Pulmonary effort is normal.     Breath sounds: No wheezing.  Abdominal:     General: Bowel sounds are normal.     Palpations: Abdomen is soft.  Skin:    General: Skin is warm and dry.     Capillary Refill: Capillary refill takes less than 2 seconds.  Neurological:     Mental Status: He is alert and oriented to person, place, and time.  Psychiatric:        Mood and Affect: Mood and affect normal.        Behavior: Behavior normal.        Thought Content: Thought content normal.        Judgment:  Judgment normal.     Ortho Exam normal heel toe gait no isolated motor weakness lower extremities. Specialty Comments:  No specialty comments available.  Imaging: No results found.   PMFS History: Patient Active Problem List   Diagnosis Date Noted  . Spinal stenosis of lumbar region 02/17/2020  . Tingling of skin 03/20/2018  . Type 2 diabetes mellitus without complication, without long-term current use of insulin ([place]) 12/19/2017  . Essential hypertension 12/19/2017   Past Medical History:  Diagnosis Date  . Back pain    intermittent-prior military injury  . BPH (benign prostatic hypertrophy) with urinary retention   . Diabetes mellitus without complication ([place])   . GERD (gastroesophageal reflux disease)   . High cholesterol   . Hypertension     Family History  Problem Relation Age of Onset  . Stroke Mother   . Stroke Father     Past Surgical History:  Procedure Laterality Date  . COLONOSCOPY    . GREEN LIGHT LASER TURP (TRANSURETHRAL RESECTION OF PROSTATE N/A 10/21/2014    Procedure: GREEN LIGHT LASER TURP (TRANSURETHRAL RESECTION OF PROSTATE;  Surgeon: Festus Aloe, MD;  Location: [facility];  Service: Urology;  Laterality: N/A;   Social History   Occupational History  . Occupation: retired  Tobacco Use  . Smoking status: Never Smoker  . Smokeless tobacco: Never Used  Vaping Use  . Vaping Use: Never used  Substance and Sexual Activity  . Alcohol use: No    Alcohol/week: 0.0 standard drinks  . Drug use: No  . Sexual activity: Yes

## 2020-03-08 NOTE — Patient Instructions (Signed)
Goals Addressed            This Visit's Progress   . Monitor and Manage My Blood Sugar-Diabetes Type 2       Timeframe:  Long-Range Goal Priority:  High Start Date:                             Expected End Date:                       Follow Up Date 06/07/2020   - check blood sugar at prescribed times - check blood sugar if I feel it is too high or too low - take the blood sugar log to all doctor visits    Why is this important?    Checking your blood sugar at home helps to keep it from getting very high or very low.   Writing the results in a diary or log helps the doctor know how to care for you.   Your blood sugar log should have the time, date and the results.   Also, write down the amount of insulin or other medicine that you take.   Other information, like what you ate, exercise done and how you were feeling, will also be helpful.           Patient Care Plan: CCM Pharmacy Care Plan    Problem Identified: HTN, HLD, DM, PAIN   Priority: High    Long-Range Goal: Disease Management   Start Date: 02/24/2020  This Visit's Progress: On track  Priority: High  Note:    Current Barriers:  . Does not adhere to prescribed medication regimen  Pharmacist Clinical Goal(s):  Marland Kitchen Over the next 90 days, patient will achieve adherence to monitoring guidelines and medication adherence to achieve therapeutic efficacy through collaboration with PharmD and provider.    Interventions: . 1:1 collaboration with [name], FNP regarding development and update of comprehensive plan of care as evidenced by provider attestation and co-signature . Inter-disciplinary care team collaboration (see longitudinal plan of care) . Comprehensive medication review performed; medication list updated in electronic medical record  Hypertension (BP goal <130/80) -controlled -Current treatment: . Olmesartan-Amlodipine-HCTZ 40-10-12.5 mg - take 1 tablet by mouth daily  -Current home readings:  138/82, checking BP about once or twice a week   -Current exercise habits: Patients reports that he was walking 3-4 miles three times per week.  -Denies hypotensive/hypertensive symptoms -Educated on BP goals and benefits of medications for prevention of heart attack, stroke and kidney damage; Daily salt intake goal < 2300 mg; Exercise goal of 150 minutes per week; Importance of home blood pressure monitoring; Proper BP monitoring technique; -Counseled to monitor BP at home at least once per week, document, and provide log at future appointments -Counseled on diet and exercise extensively Recommended to continue current medication  Hyperlipidemia: (LDL goal < 70) -uncontrolled -Current treatment: . Simvastatin 40 mg tablet daily  -Educated on Benefits of statin for ASCVD risk reduction; Importance of limiting foods high in cholesterol; Exercise goal of 150 minutes per week; -Counseled on diet and exercise extensively Recommended to continue current medication  Diabetes (A1c goal <7%) -uncontrolled -Current medications: . Jardiance 10 mg -taking 1 tablet daily  -Current home glucose readings . fasting glucose: 137, 120  -Denies hypoglycemic/hyperglycemic symptoms -Current meal patterns:  . breakfast: honey bun or frappuccino  . lunch: eating vegetables and protein . drinks: mostly water, he is  no longer drinking soda and he does drink a sweet tea sometimes.  -Current exercise: Was walking twice per week for 3-4 miles. Now he is going to start walking again per Dr. [name] the ortho doctor.  -Educated onA1c and blood sugar goals; Complications of diabetes including kidney damage, retinal damage, and cardiovascular disease; Exercise goal of 150 minutes per week; Benefits of routine self-monitoring of blood sugar; -Counseled to check feet daily and get yearly eye exams -Counseled on diet and exercise extensively Recommended to continue current medication Collaborated with PCP and  patient fill out patient assistance form   Pain  (Goal: Tolerable Pain -4/5) -controlled -Current treatment  . Tramadol 50 mg tablet - take 1 tablet by mouth every 12 hours as needed . Acetaminophen 500 mg tablet- take 1 tablet by mouth every 6 hours as needed  -Recommended to continue current medication Counseled on continuing to see provider Dr. [name]  Follow up if you pain does not get better.  Patient stated that Dr. 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   Patient Goals/Self-Care Activities . Over the next 90  days, patient will:  - take medications as prescribed target a minimum of 150 minutes of moderate intensity exercise weekly  Follow Up Plan: Telephone follow up appointment with care management team member scheduled for: 06/07/2020 Next PCP appointment scheduled for:  04/10/2020    Visit Information  Goals Addressed            This Visit's Progress   . Monitor and Manage My Blood Sugar-Diabetes Type 2       Timeframe:  Long-Range Goal Priority:  High Start Date:                             Expected End Date:                       Follow Up Date 06/07/2020   - check blood sugar at prescribed times - check blood sugar if I feel it is too high or too low - take the blood sugar log to all doctor visits    Why is this important?    Checking your blood sugar at home helps to keep it from getting very high or very low.   Writing the results in a diary or log helps the doctor know how to care for you.   Your blood sugar log should have the time, date and the results.   Also, write down the amount of insulin or other medicine that you take.   Other information, like what you ate, exercise done and how you were feeling, will also be helpful.           Patient Care Plan: CCM Pharmacy Care Plan    Problem Identified: HTN, HLD, DM, PAIN   Priority: High    Long-Range Goal: Disease Management   Start Date: 02/24/2020  This Visit's Progress: On  track  Priority: High  Note:    Current Barriers:  . Does not adhere to prescribed medication regimen  Pharmacist Clinical Goal(s):  Marland Kitchen Over the next 90 days, patient will achieve adherence to monitoring guidelines and medication adherence to achieve therapeutic efficacy through collaboration with PharmD and provider.    Interventions: . 1:1 collaboration with [name], FNP regarding development and update of comprehensive plan of care as evidenced by provider attestation and co-signature . Inter-disciplinary care team collaboration (  see longitudinal plan of care) . Comprehensive medication review performed; medication list updated in electronic medical record  Hypertension (BP goal <130/80) -controlled -Current treatment: . Olmesartan-Amlodipine-HCTZ 40-10-12.5 mg - take 1 tablet by mouth daily  -Current home readings: 138/82, checking BP about once or twice a week   -Current exercise habits: Patients reports that he was walking 3-4 miles three times per week.  -Denies hypotensive/hypertensive symptoms -Educated on BP goals and benefits of medications for prevention of heart attack, stroke and kidney damage; Daily salt intake goal < 2300 mg; Exercise goal of 150 minutes per week; Importance of home blood pressure monitoring; Proper BP monitoring technique; -Counseled to monitor BP at home at least once per week, document, and provide log at future appointments -Counseled on diet and exercise extensively Recommended to continue current medication  Hyperlipidemia: (LDL goal < 70) -uncontrolled -Current treatment: . Simvastatin 40 mg tablet daily  -Educated on Benefits of statin for ASCVD risk reduction; Importance of limiting foods high in cholesterol; Exercise goal of 150 minutes per week; -Counseled on diet and exercise extensively Recommended to continue current medication  Diabetes (A1c goal <7%) -uncontrolled -Current medications: . Jardiance 10 mg -taking 1  tablet daily  -Current home glucose readings . fasting glucose: 137, 120  -Denies hypoglycemic/hyperglycemic symptoms -Current meal patterns:  . breakfast: honey bun or frappuccino  . lunch: eating vegetables and protein . drinks: mostly water, he is no longer drinking soda and he does drink a sweet tea sometimes.  -Current exercise: Was walking twice per week for 3-4 miles. Now he is going to start walking again per Dr. [name] the ortho doctor.  -Educated onA1c and blood sugar goals; Complications of diabetes including kidney damage, retinal damage, and cardiovascular disease; Exercise goal of 150 minutes per week; Benefits of routine self-monitoring of blood sugar; -Counseled to check feet daily and get yearly eye exams -Counseled on diet and exercise extensively Recommended to continue current medication Collaborated with PCP and patient fill out patient assistance form   Pain  (Goal: Tolerable Pain -4/5) -controlled -Current treatment  . Tramadol 50 mg tablet - take 1 tablet by mouth every 12 hours as needed . Acetaminophen 500 mg tablet- take 1 tablet by mouth every 6 hours as needed  -Recommended to continue current medication Counseled on continuing to see provider Dr. [name]  Follow up if you pain does not get better.  Patient stated that Dr. 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   Patient Goals/Self-Care Activities . Over the next 90  days, patient will:  - take medications as prescribed target a minimum of 150 minutes of moderate intensity exercise weekly  Follow Up Plan: Telephone follow up appointment with care management team member scheduled for: 06/07/2020 Next PCP appointment scheduled for:  04/10/2020      The patient verbalized understanding of instructions, educational materials, and care plan provided today and agreed to receive a mailed copy of patient instructions, educational materials, and care plan.  The pharmacy team will reach out to the  patient again over the next 90 days.   Ricky Shields, [facility]

## 2020-03-29 ENCOUNTER — Telehealth: Payer: Self-pay

## 2020-03-29 NOTE — Chronic Care Management (AMB) (Signed)
Chronic Care Management Pharmacy Assistant   Name: MIKEN STECHER  MRN: [id number] DOB: Apr 20, 1945   Reason for Encounter: Hypertension Adherence Call     Recent office visits:  03/07/20-Yates, Thana Farr, MD (OV).   Recent consult visits:  None.  Hospital visits:  None in previous 6 months  Medications: Outpatient Encounter Medications as of 03/29/2020  Medication Sig  . acetaminophen (TYLENOL) 500 MG tablet Take 1 tablet (500 mg total) by mouth every 6 (six) hours as needed.  Marland Kitchen aspirin EC 81 MG tablet Take 81 mg by mouth daily. Swallow whole.  . Cholecalciferol (VITAMIN D) 50 MCG (2000 UT) tablet Take 2,000 Units by mouth daily.   . empagliflozin (JARDIANCE) 10 MG TABS tablet Take 1 tablet (10 mg total) by mouth daily before breakfast.  . glucose blood (ONETOUCH ULTRA) test strip Use as instructed  . glucose blood test strip Use as instructed to test blood sugars three times a day [id number]  . metFORMIN (GLUCOPHAGE) 1000 MG tablet Take 1 tablet (1,000 mg total) by mouth 2 (two) times daily with a meal.  . mometasone (NASONEX) 50 MCG/ACT nasal spray Place 2 sprays into the nose daily.  . Multiple Vitamin (MULTIVITAMIN WITH MINERALS) TABS tablet Take 1 tablet by mouth daily. One a Day Men's  . Multiple Vitamins-Minerals (OCUVITE PRESERVISION PO) Take 1 tablet by mouth daily.  . Olmesartan-amLODIPine-HCTZ 40-10-12.5 MG TABS TAKE 1 TABLET BY MOUTH DAILY  . Olmesartan-amLODIPine-HCTZ 40-10-12.5 MG TABS TAKE 1 TABLET BY MOUTH DAILY  . ONE TOUCH ULTRA TEST test strip   . simvastatin (ZOCOR) 40 MG tablet TAKE 1 TABLET(40 MG) BY MOUTH EVERY EVENING  . tamsulosin (FLOMAX) 0.4 MG CAPS capsule Take 0.4 mg by mouth daily.  . traMADol (ULTRAM) 50 MG tablet Take 1 tablet (50 mg total) by mouth every 12 (twelve) hours as needed.  . valACYclovir (VALTREX) 500 MG tablet Take 500 mg by mouth daily.   No facility-administered encounter medications on file as of 03/29/2020.   Reviewed chart prior  to disease state call. Spoke with patient regarding BP  Recent Office Vitals: BP Readings from Last 3 Encounters:  03/07/20 132/60  02/17/20 124/78  02/16/20 (!) 138/55   Pulse Readings from Last 3 Encounters:  03/07/20 92  02/17/20 83  02/16/20 93    Wt Readings from Last 3 Encounters:  03/07/20 163 lb (73.9 kg)  02/17/20 163 lb (73.9 kg)  02/16/20 160 lb (72.6 kg)     Kidney Function Lab Results  Component Value Date/Time   CREATININE 1.19 01/10/2020 12:24 PM   CREATININE 1.02 10/14/2019 02:21 PM   GFRNONAA 60 01/10/2020 12:24 PM   GFRAA 70 01/10/2020 12:24 PM    BMP Latest Ref Rng & Units 01/10/2020 10/14/2019 07/08/2019  Glucose 65 - 99 mg/dL 126(H) 137(H) 151(H)  BUN 8 - 27 mg/dL 17 14 11   Creatinine 0.76 - 1.27 mg/dL 1.19 1.02 1.03  BUN/Creat Ratio 10 - 24 14 14 11   Sodium 134 - 144 mmol/L 141 140 138  Potassium 3.5 - 5.2 mmol/L 4.4 4.2 3.7  Chloride 96 - 106 mmol/L 102 102 99  CO2 20 - 29 mmol/L 22 24 23   Calcium 8.6 - 10.2 mg/dL 9.8 10.2 10.3(H)    . Current antihypertensive regimen:   Olmesartan-Amlodipine-HCTZ 40-10-12.5 mg - take 1 tablet by mouth daily   . How often are you checking your Blood Pressure? Patient reports he is checking every other day.    . Current home BP  readings: 139/83.  . What recent interventions/DTPs have been made by any provider to improve Blood Pressure control since last CPP Visit:  - Patient reports he is taking medications as directed.  . Any recent hospitalizations or ED visits since last visit with CPP? No   . What diet changes have been made to improve Blood Pressure Control?  o Patient reports he always try to eat healthier. He eats a [employer], steak egg and cheese sandwich, eats two [employer] and throw the rest away. Eats broccoli, green beans, cabbage, fried okra. Patient voiced he eats fish dinners from [employer].  . What exercise is being done to improve your Blood Pressure Control?  o Patient reports he has  not walked outside in the past week, but usually walks over a mile. Patient reports he uses stationary bicycle and treadmill in basement but doesn't time himself.  Adherence Review: Is the patient currently on ACE/ARB medication? No Does the patient have >5 day gap between last estimated fill dates? No   Star Rating Drugs: Simvastatin 40 MG: #90 DS, last filled 01/24/20 at [facility]. Metformin HCL 1,000 MG: #90 DS, last filled on 12/14/19 at [facility].   Note: Patient is taking Metformin HCL-- 1 tablet 2 times daily with a meal, last fill date was on 12/14/19. Patient may need a refill of medication. Patient voiced he just had this medication filled 2 weeks ago. Patient voiced he is seeking patient assistance for Jardiance but uncertain if his income will meet the requirements, both him and wife are making $83,000 combined annually.  Teams Message sent to [facility] to initiate Patient Assistance for [place].   [name], CPP Notified.  Raynelle Highland, [place] Pharmacist Assistant [phone] CCM Total Time: 40 minutes

## 2020-04-07 ENCOUNTER — Other Ambulatory Visit: Payer: Self-pay | Admitting: Nurse Practitioner

## 2020-04-07 DIAGNOSIS — E119 Type 2 diabetes mellitus without complications: Secondary | ICD-10-CM

## 2020-04-10 ENCOUNTER — Encounter: Payer: Self-pay | Admitting: Nurse Practitioner

## 2020-04-10 ENCOUNTER — Ambulatory Visit (INDEPENDENT_AMBULATORY_CARE_PROVIDER_SITE_OTHER): Payer: PPO | Admitting: Nurse Practitioner

## 2020-04-10 ENCOUNTER — Other Ambulatory Visit: Payer: Self-pay

## 2020-04-10 VITALS — BP 138/86 | HR 88 | Temp 98.0°F | Ht 69.0 in | Wt 160.8 lb

## 2020-04-10 DIAGNOSIS — I10 Essential (primary) hypertension: Secondary | ICD-10-CM | POA: Diagnosis not present

## 2020-04-10 DIAGNOSIS — E119 Type 2 diabetes mellitus without complications: Secondary | ICD-10-CM | POA: Diagnosis not present

## 2020-04-10 DIAGNOSIS — M48061 Spinal stenosis, lumbar region without neurogenic claudication: Secondary | ICD-10-CM

## 2020-04-10 NOTE — Progress Notes (Signed)
I,Katawbba Wiggins,acting as a Education administrator for [employer], FNP.,have documented all relevant documentation on the behalf of [name], FNP,as directed by  [name], FNP while in the presence of [name], [place].  This visit occurred during the SARS-CoV-2 public health emergency.  Safety protocols were in place, including screening questions prior to the visit, additional usage of staff PPE, and extensive cleaning of exam room while observing appropriate contact time as indicated for disinfecting solutions.  Subjective:     Patient ID: Ricky Shields , male    DOB: 1945-10-21 , 75 y.o.   MRN: [id number]   Chief Complaint  Patient presents with  . Diabetes  . Hypertension    HPI  The patient is here today for a diabetes and blood pressure f/u.  This morning his blood pressure was 141/80 Diabetes He presents for his follow-up diabetic visit. He has type 2 diabetes mellitus. His disease course has been stable. There are no hypoglycemic associated symptoms. Pertinent negatives for hypoglycemia include no dizziness or headaches. Pertinent negatives for diabetes include no blurred vision, no chest pain, no fatigue, no polydipsia, no polyphagia and no polyuria. There are no hypoglycemic complications. Symptoms are stable. There are no diabetic complications. Risk factors for coronary artery disease include male sex and sedentary lifestyle. Current diabetic treatment includes oral agent (monotherapy). He is compliant with treatment all of the time. His weight is stable. He is following a diabetic diet. When asked about meal planning, he reported none. He has not had a previous visit with a dietitian. He participates in exercise intermittently. An ACE inhibitor/angiotensin II receptor blocker is being taken. He does not see a podiatrist.Eye exam is current (scheduled for tomorrow).  Hypertension This is a chronic problem. The current episode started more than 1 year ago. The problem is unchanged. The  problem is controlled. Pertinent negatives include no anxiety, blurred vision, chest pain, headaches or palpitations. There are no associated agents to hypertension. Risk factors for coronary artery disease include sedentary lifestyle and male gender. Past treatments include angiotensin blockers and diuretics. The current treatment provides no improvement. There are no compliance problems.  There is no history of angina. There is no history of chronic renal disease.     Past Medical History:  Diagnosis Date  . Back pain    intermittent-prior military injury  . BPH (benign prostatic hypertrophy) with urinary retention   . Diabetes mellitus without complication ([place])   . GERD (gastroesophageal reflux disease)   . High cholesterol   . Hypertension      Family History  Problem Relation Age of Onset  . Stroke Mother   . Stroke Father      Current Outpatient Medications:  .  acetaminophen (TYLENOL) 500 MG tablet, Take 1 tablet (500 mg total) by mouth every 6 (six) hours as needed., Disp: 30 tablet, Rfl: 0 .  aspirin EC 81 MG tablet, Take 81 mg by mouth daily. Swallow whole., Disp: , Rfl:  .  Cholecalciferol (VITAMIN D) 50 MCG (2000 UT) tablet, Take 2,000 Units by mouth daily. , Disp: , Rfl:  .  glucose blood (ONETOUCH ULTRA) test strip, Use as instructed, Disp: 100 each, Rfl: 2 .  glucose blood test strip, Use as instructed to test blood sugars three times a day DX:E11.65, Disp: 100 each, Rfl: 12 .  JARDIANCE 10 MG TABS tablet, TAKE 1 TABLET(10 MG) BY MOUTH DAILY BEFORE AND BREAKFAST, Disp: 30 tablet, Rfl: 5 .  metFORMIN (GLUCOPHAGE) 1000 MG tablet, Take  1 tablet (1,000 mg total) by mouth 2 (two) times daily with a meal., Disp: 180 tablet, Rfl: 1 .  mometasone (NASONEX) 50 MCG/ACT nasal spray, Place 2 sprays into the nose daily., Disp: 17 g, Rfl: 2 .  Multiple Vitamin (MULTIVITAMIN WITH MINERALS) TABS tablet, Take 1 tablet by mouth daily. One a Day Men's, Disp: , Rfl:  .  Multiple  Vitamins-Minerals (OCUVITE PRESERVISION PO), Take 1 tablet by mouth daily., Disp: , Rfl:  .  Olmesartan-amLODIPine-HCTZ 40-10-12.5 MG TABS, TAKE 1 TABLET BY MOUTH DAILY, Disp: 90 tablet, Rfl: 0 .  simvastatin (ZOCOR) 40 MG tablet, TAKE 1 TABLET(40 MG) BY MOUTH EVERY EVENING, Disp: 90 tablet, Rfl: 0 .  tamsulosin (FLOMAX) 0.4 MG CAPS capsule, Take 0.4 mg by mouth daily., Disp: , Rfl: 5 .  traMADol (ULTRAM) 50 MG tablet, Take 1 tablet (50 mg total) by mouth every 12 (twelve) hours as needed., Disp: 30 tablet, Rfl: 0 .  valACYclovir (VALTREX) 500 MG tablet, Take 500 mg by mouth daily., Disp: , Rfl:    Allergies  Allergen Reactions  . Penicillins Rash    Has patient had a PCN reaction causing immediate rash, facial/tongue/throat swelling, SOB or lightheadedness with hypotension: No Has patient had a PCN reaction causing severe rash involving mucus membranes or skin necrosis: No Has patient had a PCN reaction that required hospitalization No Has patient had a PCN reaction occurring within the last 10 years: No If all of the above answers are "NO", then may proceed with Cephalosporin use.      Review of Systems  Constitutional: Negative.  Negative for fatigue.  Eyes: Negative for blurred vision.  Respiratory: Negative.   Cardiovascular: Negative.  Negative for chest pain, palpitations and leg swelling.  Gastrointestinal: Negative.   Endocrine: Negative for polydipsia, polyphagia and polyuria.  Musculoskeletal: Positive for arthralgias (right hip pain) and back pain.  Neurological: Negative for dizziness and headaches.  Psychiatric/Behavioral: Negative.   All other systems reviewed and are negative.    Today's Vitals   04/10/20 1131  BP: 138/86  Pulse: 88  Temp: 98 F (36.7 C)  TempSrc: Oral  Weight: 160 lb 12.8 oz (72.9 kg)  Height: 5' 9" (1.753 m)  PainSc: 8   PainLoc: Hip   Body mass index is 23.75 kg/m.  Wt Readings from Last 3 Encounters:  04/10/20 160 lb 12.8 oz (72.9 kg)   03/07/20 163 lb (73.9 kg)  02/17/20 163 lb (73.9 kg)   Objective:  Physical Exam Vitals reviewed.  Constitutional:      General: He is not in acute distress.    Appearance: Normal appearance.  Cardiovascular:     Pulses: Normal pulses.     Heart sounds: Normal heart sounds. No murmur heard.   Pulmonary:     Effort: Pulmonary effort is normal. No respiratory distress.     Breath sounds: Normal breath sounds. No wheezing.  Musculoskeletal:        General: No swelling, tenderness or deformity. Normal range of motion.  Neurological:     General: No focal deficit present.     Mental Status: He is alert and oriented to person, place, and time.     Cranial Nerves: No cranial nerve deficit.  Psychiatric:        Mood and Affect: Mood normal.        Behavior: Behavior normal.        Thought Content: Thought content normal.        Judgment: Judgment normal.  Assessment And Plan:     1. Type 2 diabetes mellitus without complication, without long-term current use of insulin (HCC)  Chronic, he is tolerating his Jardiance well  Continue with current medications  Will check HgbA1c and kidney functions - BMP8+EGFR - Hemoglobin A1c  2. Essential hypertension . B/P is controlled.  . CMP ordered to check renal function.  . The importance of regular exercise and dietary modification was stressed to the patient.  . Stressed importance of losing ten percent of her body weight to help with B/P control.  - BMP8+EGFR  3. Spinal stenosis of lumbar region without neurogenic claudication He continues to have pain to his back and hip, according to his records from the specialist he has severe spinal stenosis, I had a long discussion about what spinal stenosis. I have explained to him according to the notes he may need surgical intervention, he is not interested in surgery at this time. Explained to him the injections will only be a temporary fix.    Patient was given opportunity to  ask questions. Patient verbalized understanding of the plan and was able to repeat key elements of the plan. All questions were answered to their satisfaction.  [name], FNP   I, [name], FNP, have reviewed all documentation for this visit. The documentation on 04/10/20 for the exam, diagnosis, procedures, and orders are all accurate and complete  IF YOU HAVE BEEN REFERRED TO A SPECIALIST, IT MAY TAKE 1-2 WEEKS TO SCHEDULE/PROCESS THE REFERRAL. IF YOU HAVE NOT HEARD FROM US/SPECIALIST IN TWO WEEKS, PLEASE GIVE [place] A CALL AT [phone].   THE PATIENT IS ENCOURAGED TO PRACTICE SOCIAL DISTANCING DUE TO THE COVID-19 PANDEMIC.

## 2020-04-10 NOTE — Patient Instructions (Signed)
Diabetes Mellitus and Foot Care Foot care is an important part of your health, especially when you have diabetes. Diabetes may cause you to have problems because of poor blood flow (circulation) to your feet and legs, which can cause your skin to:  Become thinner and drier.  Break more easily.  Heal more slowly.  Peel and crack. You may also have nerve damage (neuropathy) in your legs and feet, causing decreased feeling in them. This means that you may not notice minor injuries to your feet that could lead to more serious problems. Noticing and addressing any potential problems early is the best way to prevent future foot problems. How to care for your feet Foot hygiene  Wash your feet daily with warm water and mild soap. Do not use hot water. Then, pat your feet and the areas between your toes until they are completely dry. Do not soak your feet as this can dry your skin.  Trim your toenails straight across. Do not dig under them or around the cuticle. File the edges of your nails with an emery board or nail file.  Apply a moisturizing lotion or petroleum jelly to the skin on your feet and to dry, brittle toenails. Use lotion that does not contain alcohol and is unscented. Do not apply lotion between your toes.   Shoes and socks  Wear clean socks or stockings every day. Make sure they are not too tight. Do not wear knee-high stockings since they may decrease blood flow to your legs.  Wear shoes that fit properly and have enough cushioning. Always look in your shoes before you put them on to be sure there are no objects inside.  To break in new shoes, wear them for just a few hours a day. This prevents injuries on your feet. Wounds, scrapes, corns, and calluses  Check your feet daily for blisters, cuts, bruises, sores, and redness. If you cannot see the bottom of your feet, use a mirror or ask someone for help.  Do not cut corns or calluses or try to remove them with medicine.  If you  find a minor scrape, cut, or break in the skin on your feet, keep it and the skin around it clean and dry. You may clean these areas with mild soap and water. Do not clean the area with peroxide, alcohol, or iodine.  If you have a wound, scrape, corn, or callus on your foot, look at it several times a day to make sure it is healing and not infected. Check for: ? Redness, swelling, or pain. ? Fluid or blood. ? Warmth. ? Pus or a bad smell.   General tips  Do not cross your legs. This may decrease blood flow to your feet.  Do not use heating pads or hot water bottles on your feet. They may burn your skin. If you have lost feeling in your feet or legs, you may not know this is happening until it is too late.  Protect your feet from hot and cold by wearing shoes, such as at the beach or on hot pavement.  Schedule a complete foot exam at least once a year (annually) or more often if you have foot problems. Report any cuts, sores, or bruises to your health care provider immediately. Where to find more information  American Diabetes Association: www.diabetes.org  Association of Diabetes Care & Education Specialists: www.diabeteseducator.org Contact a health care provider if:  You have a medical condition that increases your risk of infection and   you have any cuts, sores, or bruises on your feet.  You have an injury that is not healing.  You have redness on your legs or feet.  You feel burning or tingling in your legs or feet.  You have pain or cramps in your legs and feet.  Your legs or feet are numb.  Your feet always feel cold.  You have pain around any toenails. Get help right away if:  You have a wound, scrape, corn, or callus on your foot and: ? You have pain, swelling, or redness that gets worse. ? You have fluid or blood coming from the wound, scrape, corn, or callus. ? Your wound, scrape, corn, or callus feels warm to the touch. ? You have pus or a bad smell coming from  the wound, scrape, corn, or callus. ? You have a fever. ? You have a red line going up your leg. Summary  Check your feet every day for blisters, cuts, bruises, sores, and redness.  Apply a moisturizing lotion or petroleum jelly to the skin on your feet and to dry, brittle toenails.  Wear shoes that fit properly and have enough cushioning.  If you have foot problems, report any cuts, sores, or bruises to your health care provider immediately.  Schedule a complete foot exam at least once a year (annually) or more often if you have foot problems. This information is not intended to replace advice given to you by your health care provider. Make sure you discuss any questions you have with your health care provider. Document Revised: 07/22/2019 Document Reviewed: 07/22/2019 Elsevier Patient Education  2021 Elsevier Inc.  

## 2020-04-11 LAB — BMP8+EGFR
BUN/Creatinine Ratio: 14 (ref 10–24)
BUN: 15 mg/dL (ref 8–27)
CO2: 22 mmol/L (ref 20–29)
Calcium: 10.2 mg/dL (ref 8.6–10.2)
Chloride: 100 mmol/L (ref 96–106)
Creatinine, Ser: 1.06 mg/dL (ref 0.76–1.27)
Glucose: 115 mg/dL — ABNORMAL HIGH (ref 65–99)
Potassium: 4.7 mmol/L (ref 3.5–5.2)
Sodium: 139 mmol/L (ref 134–144)
eGFR: 74 mL/min/{1.73_m2} (ref >59)

## 2020-04-11 LAB — HEMOGLOBIN A1C
Est. average glucose Bld gHb Est-mCnc: 148 mg/dL
Hgb A1c MFr Bld: 6.8 % — ABNORMAL HIGH (ref 4.8–5.6)

## 2020-04-24 ENCOUNTER — Other Ambulatory Visit: Payer: Self-pay | Admitting: Internal Medicine

## 2020-06-05 ENCOUNTER — Telehealth: Payer: Self-pay

## 2020-06-05 NOTE — Progress Notes (Signed)
Left patient a voicemail with appointment reminder with Ricky Shields ,[place] on 06-06-2020 at 11:30. Instructed patient to have all meds/supplements, any logs available for review and if unable to keep appointment to call to reschedule.  [facility] Pharmacist Assistant [phone]

## 2020-06-06 ENCOUNTER — Telehealth: Payer: Self-pay

## 2020-06-10 ENCOUNTER — Other Ambulatory Visit: Payer: Self-pay | Admitting: Nurse Practitioner

## 2020-06-14 ENCOUNTER — Telehealth: Payer: Self-pay

## 2020-06-14 NOTE — Telephone Encounter (Signed)
I called pt and let him know his refill was sent to the pharmacy. YL,RMA

## 2020-06-15 ENCOUNTER — Other Ambulatory Visit: Payer: Self-pay | Admitting: Nurse Practitioner

## 2020-06-15 DIAGNOSIS — E119 Type 2 diabetes mellitus without complications: Secondary | ICD-10-CM

## 2020-07-12 ENCOUNTER — Ambulatory Visit (INDEPENDENT_AMBULATORY_CARE_PROVIDER_SITE_OTHER): Payer: PPO | Admitting: Nurse Practitioner

## 2020-07-12 ENCOUNTER — Encounter: Payer: Self-pay | Admitting: Nurse Practitioner

## 2020-07-12 VITALS — BP 130/76 | HR 96 | Temp 98.1°F | Ht 66.8 in | Wt 155.6 lb

## 2020-07-12 DIAGNOSIS — I10 Essential (primary) hypertension: Secondary | ICD-10-CM

## 2020-07-12 DIAGNOSIS — H6123 Impacted cerumen, bilateral: Secondary | ICD-10-CM

## 2020-07-12 DIAGNOSIS — M25559 Pain in unspecified hip: Secondary | ICD-10-CM

## 2020-07-12 DIAGNOSIS — M48061 Spinal stenosis, lumbar region without neurogenic claudication: Secondary | ICD-10-CM | POA: Diagnosis not present

## 2020-07-12 DIAGNOSIS — Z0000 Encounter for general adult medical examination without abnormal findings: Secondary | ICD-10-CM | POA: Diagnosis not present

## 2020-07-12 DIAGNOSIS — Z23 Encounter for immunization: Secondary | ICD-10-CM

## 2020-07-12 DIAGNOSIS — E119 Type 2 diabetes mellitus without complications: Secondary | ICD-10-CM | POA: Diagnosis not present

## 2020-07-12 LAB — LIPID PANEL
Chol/HDL Ratio: 2.9 ratio (ref 0.0–5.0)
Cholesterol, Total: 146 mg/dL (ref 100–199)
HDL: 50 mg/dL (ref >39)
LDL Chol Calc (NIH): 79 mg/dL (ref 0–99)
Triglycerides: 90 mg/dL (ref 0–149)
VLDL Cholesterol Cal: 17 mg/dL (ref 5–40)

## 2020-07-12 LAB — CMP14+EGFR
ALT: 14 IU/L (ref 0–44)
AST: 20 IU/L (ref 0–40)
Albumin/Globulin Ratio: 2 (ref 1.2–2.2)
Albumin: 5 g/dL — ABNORMAL HIGH (ref 3.7–4.7)
Alkaline Phosphatase: 73 IU/L (ref 44–121)
BUN/Creatinine Ratio: 14 (ref 10–24)
BUN: 15 mg/dL (ref 8–27)
Bilirubin Total: 0.7 mg/dL (ref 0.0–1.2)
CO2: 22 mmol/L (ref 20–29)
Calcium: 10.5 mg/dL — ABNORMAL HIGH (ref 8.6–10.2)
Chloride: 99 mmol/L (ref 96–106)
Creatinine, Ser: 1.09 mg/dL (ref 0.76–1.27)
Globulin, Total: 2.5 g/dL (ref 1.5–4.5)
Glucose: 126 mg/dL — ABNORMAL HIGH (ref 65–99)
Potassium: 4.6 mmol/L (ref 3.5–5.2)
Sodium: 140 mmol/L (ref 134–144)
Total Protein: 7.5 g/dL (ref 6.0–8.5)
eGFR: 71 mL/min/{1.73_m2} (ref >59)

## 2020-07-12 LAB — CBC
Hematocrit: 43.2 % (ref 37.5–51.0)
Hemoglobin: 14.9 g/dL (ref 13.0–17.7)
MCH: 32.9 pg (ref 26.6–33.0)
MCHC: 34.5 g/dL (ref 31.5–35.7)
MCV: 95 fL (ref 79–97)
Platelets: 262 10*3/uL (ref 150–450)
RBC: 4.53 x10E6/uL (ref 4.14–5.80)
RDW: 12.4 % (ref 11.6–15.4)
WBC: 5.9 10*3/uL (ref 3.4–10.8)

## 2020-07-12 LAB — HEMOGLOBIN A1C
Est. average glucose Bld gHb Est-mCnc: 148 mg/dL
Hgb A1c MFr Bld: 6.8 % — ABNORMAL HIGH (ref 4.8–5.6)

## 2020-07-12 MED ORDER — SHINGRIX 50 MCG/0.5ML IM SUSR: 0.5000 mL | Freq: Once | INTRAMUSCULAR | 0 refills | Status: AC

## 2020-07-12 MED ORDER — MELOXICAM 7.5 MG PO TABS: 7.5000 mg | ORAL_TABLET | Freq: Every day | ORAL | 2 refills | Status: DC

## 2020-07-12 NOTE — Patient Instructions (Signed)
Health Maintenance, Male Adopting a healthy lifestyle and getting preventive care are important in promoting health and wellness. Ask your health care provider about: The right schedule for you to have regular tests and exams. Things you can do on your own to prevent diseases and keep yourself healthy. What should I know about diet, weight, and exercise? Eat a healthy diet  Eat a diet that includes plenty of vegetables, fruits, low-fat dairy products, and lean protein. Do not eat a lot of foods that are high in solid fats, added sugars, or sodium.  Maintain a healthy weight Body mass index (BMI) is a measurement that can be used to identify possible weight problems. It estimates body fat based on height and weight. Your health care provider can help determine your BMI and help you achieve or maintain ahealthy weight. Get regular exercise Get regular exercise. This is one of the most important things you can do for your health. Most adults should: Exercise for at least 150 minutes each week. The exercise should increase your heart rate and make you sweat (moderate-intensity exercise). Do strengthening exercises at least twice a week. This is in addition to the moderate-intensity exercise. Spend less time sitting. Even light physical activity can be beneficial. Watch cholesterol and blood lipids Have your blood tested for lipids and cholesterol at 74 years of age, then havethis test every 5 years. You may need to have your cholesterol levels checked more often if: Your lipid or cholesterol levels are high. You are older than 74 years of age. You are at high risk for heart disease. What should I know about cancer screening? Many types of cancers can be detected early and may often be prevented. Depending on your health history and family history, you may need to have cancer screening at various ages. This may include screening for: Colorectal cancer. Prostate cancer. Skin cancer. Lung  cancer. What should I know about heart disease, diabetes, and high blood pressure? Blood pressure and heart disease High blood pressure causes heart disease and increases the risk of stroke. This is more likely to develop in people who have high blood pressure readings, are of African descent, or are overweight. Talk with your health care provider about your target blood pressure readings. Have your blood pressure checked: Every 3-5 years if you are 18-39 years of age. Every year if you are 40 years old or older. If you are between the ages of 65 and 75 and are a current or former smoker, ask your health care provider if you should have a one-time screening for abdominal aortic aneurysm (AAA). Diabetes Have regular diabetes screenings. This checks your fasting blood sugar level. Have the screening done: Once every three years after age 45 if you are at a normal weight and have a low risk for diabetes. More often and at a younger age if you are overweight or have a high risk for diabetes. What should I know about preventing infection? Hepatitis B If you have a higher risk for hepatitis B, you should be screened for this virus. Talk with your health care provider to find out if you are at risk forhepatitis B infection. Hepatitis C Blood testing is recommended for: Everyone born from 1945 through 1965. Anyone with known risk factors for hepatitis C. Sexually transmitted infections (STIs) You should be screened each year for STIs, including gonorrhea and chlamydia, if: You are sexually active and are younger than 74 years of age. You are older than 74 years of age   and your health care provider tells you that you are at risk for this type of infection. Your sexual activity has changed since you were last screened, and you are at increased risk for chlamydia or gonorrhea. Ask your health care provider if you are at risk. Ask your health care provider about whether you are at high risk for HIV.  Your health care provider may recommend a prescription medicine to help prevent HIV infection. If you choose to take medicine to prevent HIV, you should first get tested for HIV. You should then be tested every 3 months for as long as you are taking the medicine. Follow these instructions at home: Lifestyle Do not use any products that contain nicotine or tobacco, such as cigarettes, e-cigarettes, and chewing tobacco. If you need help quitting, ask your health care provider. Do not use street drugs. Do not share needles. Ask your health care provider for help if you need support or information about quitting drugs. Alcohol use Do not drink alcohol if your health care provider tells you not to drink. If you drink alcohol: Limit how much you have to 0-2 drinks a day. Be aware of how much alcohol is in your drink. In the [place] one drink equals one 12 oz bottle of beer (355 mL), one 5 oz glass of wine (148 mL), or one 1 oz glass of hard liquor (44 mL). General instructions Schedule regular health, dental, and eye exams. Stay current with your vaccines. Tell your health care provider if: You often feel depressed. You have ever been abused or do not feel safe at home. Summary Adopting a healthy lifestyle and getting preventive care are important in promoting health and wellness. Follow your health care provider's instructions about healthy diet, exercising, and getting tested or screened for diseases. Follow your health care provider's instructions on monitoring your cholesterol and blood pressure. This information is not intended to replace advice given to you by your health care provider. Make sure you discuss any questions you have with your healthcare provider. Document Revised: 12/24/2017 Document Reviewed: 12/24/2017 Elsevier Patient Education  2022 [employer]  

## 2020-07-12 NOTE — Progress Notes (Signed)
I,Ricky Shields,acting as a Education administrator for [employer], FNP.,have documented all relevant documentation on the behalf of Ricky Brine, FNP,as directed by  Ricky Brine, FNP while in the presence of Ricky Shields, [place].  This visit occurred during the SARS-CoV-2 public health emergency.  Safety protocols were in place, including screening questions prior to the visit, additional usage of staff PPE, and extensive cleaning of exam room while observing appropriate contact time as indicated for disinfecting solutions.  Subjective:     Patient ID: Ricky Shields , male    DOB: 09-23-1945 , 75 y.o.   MRN: [id number]   Chief Complaint  Patient presents with   Annual Exam    HPI  The patient is here today for physical exam. He is followed by Dr [name] as his Urologist.  He is having some knee pain. He does see Dr [name] as his Orthopedic but does not see him until next month. He is taking Tramadol as needed and felt was not helping. He is taking Tylenol daily.   Diabetes He presents for his follow-up diabetic visit. He has type 2 diabetes mellitus. His disease course has been stable. There are no hypoglycemic associated symptoms. Pertinent negatives for hypoglycemia include no dizziness or headaches. Pertinent negatives for diabetes include no blurred vision, no chest pain, no fatigue, no polydipsia, no polyphagia and no polyuria. There are no hypoglycemic complications. Symptoms are stable. There are no diabetic complications. Risk factors for coronary artery disease include male sex and sedentary lifestyle. Current diabetic treatment includes oral agent (monotherapy). He is compliant with treatment all of the time. His weight is stable. He is following a diabetic diet. When asked about meal planning, he reported none. He has not had a previous visit with a dietitian. He participates in exercise intermittently. An ACE inhibitor/angiotensin II receptor blocker is being taken. He does not see a podiatrist.Eye  exam is current (scheduled for tomorrow).  Hypertension This is a chronic problem. The current episode started more than 1 year ago. The problem is unchanged. The problem is controlled. Pertinent negatives include no anxiety, blurred vision, chest pain, headaches or palpitations. There are no associated agents to hypertension. Risk factors for coronary artery disease include sedentary lifestyle and male gender. Past treatments include angiotensin blockers and diuretics. The current treatment provides no improvement. There are no compliance problems.  There is no history of angina. There is no history of chronic renal disease.    Past Medical History:  Diagnosis Date   Back pain    intermittent-prior military injury   BPH (benign prostatic hypertrophy) with urinary retention    Diabetes mellitus without complication (HCC)    GERD (gastroesophageal reflux disease)    High cholesterol    Hypertension      Family History  Problem Relation Age of Onset   Stroke Mother    Stroke Father      Current Outpatient Medications:    acetaminophen (TYLENOL) 500 MG tablet, Take 1 tablet (500 mg total) by mouth every 6 (six) hours as needed., Disp: 30 tablet, Rfl: 0   aspirin EC 81 MG tablet, Take 81 mg by mouth daily. Swallow whole., Disp: , Rfl:    Cholecalciferol (VITAMIN D) 50 MCG (2000 UT) tablet, Take 2,000 Units by mouth daily. , Disp: , Rfl:    glucose blood (ONETOUCH ULTRA) test strip, Use as instructed, Disp: 100 each, Rfl: 2   glucose blood test strip, Use as instructed to test blood sugars three times a day DX:E11.65,  Disp: 100 each, Rfl: 12   JARDIANCE 10 MG TABS tablet, TAKE 1 TABLET(10 MG) BY MOUTH DAILY BEFORE AND BREAKFAST, Disp: 30 tablet, Rfl: 5   meloxicam (MOBIC) 7.5 MG tablet, Take 1 tablet (7.5 mg total) by mouth daily., Disp: 30 tablet, Rfl: 2   metFORMIN (GLUCOPHAGE) 1000 MG tablet, TAKE 1 TABLET(1000 MG) BY MOUTH TWICE DAILY WITH A MEAL, Disp: 180 tablet, Rfl: 1   mometasone  (NASONEX) 50 MCG/ACT nasal spray, Place 2 sprays into the nose daily., Disp: 17 g, Rfl: 2   Multiple Vitamin (MULTIVITAMIN WITH MINERALS) TABS tablet, Take 1 tablet by mouth daily. One a Day Men's, Disp: , Rfl:    Olmesartan-amLODIPine-HCTZ 40-10-12.5 MG TABS, TAKE 1 TABLET BY MOUTH DAILY, Disp: 90 tablet, Rfl: 0   simvastatin (ZOCOR) 40 MG tablet, TAKE 1 TABLET(40 MG) BY MOUTH EVERY EVENING, Disp: 90 tablet, Rfl: 0   tamsulosin (FLOMAX) 0.4 MG CAPS capsule, Take 0.4 mg by mouth daily., Disp: , Rfl: 5   traMADol (ULTRAM) 50 MG tablet, Take 1 tablet (50 mg total) by mouth every 12 (twelve) hours as needed. (Patient not taking: Reported on 07/13/2020), Disp: 30 tablet, Rfl: 0   valACYclovir (VALTREX) 500 MG tablet, Take 500 mg by mouth daily., Disp: , Rfl:    Allergies  Allergen Reactions   Penicillins Rash    Has patient had a PCN reaction causing immediate rash, facial/tongue/throat swelling, SOB or lightheadedness with hypotension: No Has patient had a PCN reaction causing severe rash involving mucus membranes or skin necrosis: No Has patient had a PCN reaction that required hospitalization No Has patient had a PCN reaction occurring within the last 10 years: No If all of the above answers are "NO", then may proceed with Cephalosporin use.      Men's preventive visit. Patient Health Questionnaire (PHQ-2) is  [place] Office Visit from 07/12/2020 in [facility]  PHQ-2 Total Score 0      Patient is on a Regular diet, does protein shakes at home. Exercises - with walking on the treadmill and will do the stationary bike with dumbbells. Marital status: Married. Relevant history for alcohol use is:  Social History   Substance and Sexual Activity  Alcohol Use No   Alcohol/week: 0.0 standard drinks   Relevant history for tobacco use is:  Social History   Tobacco Use  Smoking Status Never  Smokeless Tobacco Never  .   Review of Systems  Constitutional:  Negative.  Negative for fatigue.  HENT: Negative.    Eyes: Negative.  Negative for blurred vision.  Respiratory: Negative.    Cardiovascular: Negative.  Negative for chest pain, palpitations and leg swelling.  Gastrointestinal: Negative.   Endocrine: Negative.  Negative for polydipsia, polyphagia and polyuria.  Genitourinary: Negative.   Musculoskeletal:  Positive for arthralgias.  Skin: Negative.   Allergic/Immunologic: Negative.   Neurological: Negative.  Negative for dizziness and headaches.  Hematological: Negative.   Psychiatric/Behavioral: Negative.      Today's Vitals   07/12/20 0959  BP: 130/76  Pulse: 96  Temp: 98.1 F (36.7 C)  TempSrc: Oral  Weight: 155 lb 9.6 oz (70.6 kg)  Height: 5' 6.8" (1.697 m)   Body mass index is 24.52 kg/m.  Wt Readings from Last 3 Encounters:  07/13/20 155 lb (70.3 kg)  07/12/20 155 lb 9.6 oz (70.6 kg)  04/10/20 160 lb 12.8 oz (72.9 kg)    Objective:  Physical Exam Vitals reviewed.  Constitutional:  General: He is not in acute distress.    Appearance: Normal appearance.  HENT:     Head: Normocephalic and atraumatic.     Right Ear: External ear normal. There is no impacted cerumen (excess cerumen).     Left Ear: External ear normal. There is no impacted cerumen (excess cerumen).  Cardiovascular:     Rate and Rhythm: Normal rate and regular rhythm.     Pulses: Normal pulses.     Heart sounds: Normal heart sounds. No murmur heard. Pulmonary:     Effort: Pulmonary effort is normal. No respiratory distress.     Breath sounds: Normal breath sounds. No wheezing.  Abdominal:     General: Abdomen is flat. Bowel sounds are normal. There is no distension.     Palpations: Abdomen is soft.     Tenderness: There is no abdominal tenderness.  Genitourinary:    Comments: Deferred - sees Urologist Musculoskeletal:        General: Normal range of motion.     Cervical back: Normal range of motion and neck supple.  Skin:    General: Skin  is warm.     Capillary Refill: Capillary refill takes less than 2 seconds.     Comments: Bruising to left forearm  Neurological:     General: No focal deficit present.     Mental Status: He is alert and oriented to person, place, and time.     Cranial Nerves: No cranial nerve deficit.  Psychiatric:        Mood and Affect: Mood normal.        Behavior: Behavior normal.        Thought Content: Thought content normal.        Judgment: Judgment normal.        Assessment And Plan:    1. Encounter for general adult medical examination w/o abnormal findings Behavior modifications discussed and diet history reviewed.   Pt will continue to exercise regularly and modify diet with low GI, plant based foods and decrease intake of processed foods.  Recommend intake of daily multivitamin, Vitamin D, and calcium.  Recommend colonoscopy (up to date) for preventive screenings, as well as recommend immunizations that include influenza, TDAP, and Shingles  2. Type 2 diabetes mellitus without complication, without long-term current use of insulin (HCC) Chronic, good control Continue with current medications Encouraged to limit intake of sugary foods and drinks Encouraged to increase physical activity to 150 minutes per week as tolerated - CBC - Hemoglobin A1c - CMP14+EGFR - Lipid panel  3. Essential hypertension Chronic, fair condition Continue current medications EKG done with NSR HR 94 - POCT Urinalysis Dipstick (81002) - POCT UA - Microalbumin - EKG 12-Lead  4. Spinal stenosis of lumbar region without neurogenic claudication He is encouraged to continue follow up with orthopedics Will start him on meloxicam to limit the amount of Toradol and steroids he is given and to hopefully keep him more comfortable. - meloxicam (MOBIC) 7.5 MG tablet; Take 1 tablet (7.5 mg total) by mouth daily.  Dispense: 30 tablet; Refill: 2  5. Hip pain - meloxicam (MOBIC) 7.5 MG tablet; Take 1 tablet (7.5 mg  total) by mouth daily.  Dispense: 30 tablet; Refill: 2  6. Encounter for immunization - Zoster Vaccine Adjuvanted [facility]) injection; Inject 0.5 mLs into the muscle once for 1 dose.  Dispense: 0.5 mL; Refill: 0  7. Excessive cerumen in both ear canals Advised to clean ears with 1/2 water and 1/2 peroxide, did not  need to remove cerumen    Patient was given opportunity to ask questions. Patient verbalized understanding of the plan and was able to repeat key elements of the plan. All questions were answered to their satisfaction.   Ricky Brine, FNP   I, Ricky Brine, FNP, have reviewed all documentation for this visit. The documentation on 07/12/20 for the exam, diagnosis, procedures, and orders are all accurate and complete.   THE PATIENT IS ENCOURAGED TO PRACTICE SOCIAL DISTANCING DUE TO THE COVID-19 PANDEMIC.

## 2020-07-13 ENCOUNTER — Ambulatory Visit (INDEPENDENT_AMBULATORY_CARE_PROVIDER_SITE_OTHER): Payer: PPO

## 2020-07-13 VITALS — Ht 69.0 in | Wt 155.0 lb

## 2020-07-13 DIAGNOSIS — Z0000 Encounter for general adult medical examination without abnormal findings: Secondary | ICD-10-CM

## 2020-07-13 NOTE — Patient Instructions (Signed)
Ricky Shields , Thank you for taking time to come for your Medicare Wellness Visit. I appreciate your ongoing commitment to your health goals. Please review the following plan we discussed and let me know if I can assist you in the future.   Screening recommendations/referrals: Colonoscopy: completed 03/05/2014 Recommended yearly ophthalmology/optometry visit for glaucoma screening and checkup Recommended yearly dental visit for hygiene and checkup  Vaccinations: Influenza vaccine: completed 10/19/2019, due 08/14/2020 Pneumococcal vaccine: completed 09/30/2018 Tdap vaccine: completed 11/02/2012, due 11/03/2022 Shingles vaccine: discussed   Covid-19:  04/25/2020, 11/13/2019, 03/12/2019, 02/19/2019  Advanced directives: Advance directive discussed with you today.   Conditions/risks identified: none  Next appointment: Follow up in one year for your annual wellness visit.   Preventive Care 36 Years and Older, Male Preventive care refers to lifestyle choices and visits with your health care provider that can promote health and wellness. What does preventive care include? A yearly physical exam. This is also called an annual well check. Dental exams once or twice a year. Routine eye exams. Ask your health care provider how often you should have your eyes checked. Personal lifestyle choices, including: Daily care of your teeth and gums. Regular physical activity. Eating a healthy diet. Avoiding tobacco and drug use. Limiting alcohol use. Practicing safe sex. Taking low doses of aspirin every day. Taking vitamin and mineral supplements as recommended by your health care provider. What happens during an annual well check? The services and screenings done by your health care provider during your annual well check will depend on your age, overall health, lifestyle risk factors, and family history of disease. Counseling  Your health care provider may ask you questions about your: Alcohol  use. Tobacco use. Drug use. Emotional well-being. Home and relationship well-being. Sexual activity. Eating habits. History of falls. Memory and ability to understand (cognition). Work and work Statistician. Screening  You may have the following tests or measurements: Height, weight, and BMI. Blood pressure. Lipid and cholesterol levels. These may be checked every 5 years, or more frequently if you are over 38 years old. Skin check. Lung cancer screening. You may have this screening every year starting at age 74 if you have a 30-pack-year history of smoking and currently smoke or have quit within the past 15 years. Fecal occult blood test (FOBT) of the stool. You may have this test every year starting at age 68. Flexible sigmoidoscopy or colonoscopy. You may have a sigmoidoscopy every 5 years or a colonoscopy every 10 years starting at age 73. Prostate cancer screening. Recommendations will vary depending on your family history and other risks. Hepatitis C blood test. Hepatitis B blood test. Sexually transmitted disease (STD) testing. Diabetes screening. This is done by checking your blood sugar (glucose) after you have not eaten for a while (fasting). You may have this done every 1-3 years. Abdominal aortic aneurysm (AAA) screening. You may need this if you are a current or former smoker. Osteoporosis. You may be screened starting at age 52 if you are at high risk. Talk with your health care provider about your test results, treatment options, and if necessary, the need for more tests. Vaccines  Your health care provider may recommend certain vaccines, such as: Influenza vaccine. This is recommended every year. Tetanus, diphtheria, and acellular pertussis (Tdap, Td) vaccine. You may need a Td booster every 10 years. Zoster vaccine. You may need this after age 13. Pneumococcal 13-valent conjugate (PCV13) vaccine. One dose is recommended after age 45. Pneumococcal polysaccharide  (PPSV23) vaccine.  One dose is recommended after age 15. Talk to your health care provider about which screenings and vaccines you need and how often you need them. This information is not intended to replace advice given to you by your health care provider. Make sure you discuss any questions you have with your health care provider. Document Released: 01/27/2015 Document Revised: 09/20/2015 Document Reviewed: 11/01/2014 Elsevier Interactive Patient Education  2017 [place] Prevention in the Home Falls can cause injuries. They can happen to people of all ages. There are many things you can do to make your home safe and to help prevent falls. What can I do on the outside of my home? Regularly fix the edges of walkways and driveways and fix any cracks. Remove anything that might make you trip as you walk through a door, such as a raised step or threshold. Trim any bushes or trees on the path to your home. Use bright outdoor lighting. Clear any walking paths of anything that might make someone trip, such as rocks or tools. Regularly check to see if handrails are loose or broken. Make sure that both sides of any steps have handrails. Any raised decks and porches should have guardrails on the edges. Have any leaves, snow, or ice cleared regularly. Use sand or salt on walking paths during winter. Clean up any spills in your garage right away. This includes oil or grease spills. What can I do in the bathroom? Use night lights. Install grab bars by the toilet and in the tub and shower. Do not use towel bars as grab bars. Use non-skid mats or decals in the tub or shower. If you need to sit down in the shower, use a plastic, non-slip stool. Keep the floor dry. Clean up any water that spills on the floor as soon as it happens. Remove soap buildup in the tub or shower regularly. Attach bath mats securely with double-sided non-slip rug tape. Do not have throw rugs and other things on the  floor that can make you trip. What can I do in the bedroom? Use night lights. Make sure that you have a light by your bed that is easy to reach. Do not use any sheets or blankets that are too big for your bed. They should not hang down onto the floor. Have a firm chair that has side arms. You can use this for support while you get dressed. Do not have throw rugs and other things on the floor that can make you trip. What can I do in the kitchen? Clean up any spills right away. Avoid walking on wet floors. Keep items that you use a lot in easy-to-reach places. If you need to reach something above you, use a strong step stool that has a grab bar. Keep electrical cords out of the way. Do not use floor polish or wax that makes floors slippery. If you must use wax, use non-skid floor wax. Do not have throw rugs and other things on the floor that can make you trip. What can I do with my stairs? Do not leave any items on the stairs. Make sure that there are handrails on both sides of the stairs and use them. Fix handrails that are broken or loose. Make sure that handrails are as long as the stairways. Check any carpeting to make sure that it is firmly attached to the stairs. Fix any carpet that is loose or worn. Avoid having throw rugs at the top or bottom of the  stairs. If you do have throw rugs, attach them to the floor with carpet tape. Make sure that you have a light switch at the top of the stairs and the bottom of the stairs. If you do not have them, ask someone to add them for you. What else can I do to help prevent falls? Wear shoes that: Do not have high heels. Have rubber bottoms. Are comfortable and fit you well. Are closed at the toe. Do not wear sandals. If you use a stepladder: Make sure that it is fully opened. Do not climb a closed stepladder. Make sure that both sides of the stepladder are locked into place. Ask someone to hold it for you, if possible. Clearly mark and make  sure that you can see: Any grab bars or handrails. First and last steps. Where the edge of each step is. Use tools that help you move around (mobility aids) if they are needed. These include: Canes. Walkers. Scooters. Crutches. Turn on the lights when you go into a dark area. Replace any light bulbs as soon as they burn out. Set up your furniture so you have a clear path. Avoid moving your furniture around. If any of your floors are uneven, fix them. If there are any pets around you, be aware of where they are. Review your medicines with your doctor. Some medicines can make you feel dizzy. This can increase your chance of falling. Ask your doctor what other things that you can do to help prevent falls. This information is not intended to replace advice given to you by your health care provider. Make sure you discuss any questions you have with your health care provider. Document Released: 10/27/2008 Document Revised: 06/08/2015 Document Reviewed: 02/04/2014 Elsevier Interactive Patient Education  2017 [employer].

## 2020-07-13 NOTE — Progress Notes (Signed)
I connected with Ricky Shields today by telephone and verified that I am speaking with the correct person using two identifiers. Location patient: home Location provider: work Persons participating in the virtual visit: [place], [place] LPN.   I discussed the limitations, risks, security and privacy concerns of performing an evaluation and management service by telephone and the availability of in person appointments. I also discussed with the patient that there may be a patient responsible charge related to this service. The patient expressed understanding and verbally consented to this telephonic visit.    Interactive audio and video telecommunications were attempted between this provider and patient, however failed, due to patient having technical difficulties OR patient did not have access to video capability.  We continued and completed visit with audio only.     Vital signs may be patient reported or missing.  Subjective:   Ricky Shields is a 75 y.o. male who presents for Medicare Annual/Subsequent preventive examination.  Review of Systems     Cardiac Risk Factors include: advanced age (>81men, >66 women);diabetes mellitus;hypertension;male gender     Objective:    Today's Vitals   07/13/20 1056 07/13/20 1057  Weight: 155 lb (70.3 kg)   Height: 5\' 9"  (1.753 m)   PainSc:  8    Body mass index is 22.89 kg/m.  Advanced Directives 07/13/2020 07/08/2019 06/25/2018 01/27/2016 10/21/2014  Does Patient Have a Medical Advance Directive? No Yes No No No  Type of Advance Directive - Healthcare Power of [place];Living will - - -  Copy of [place] in Chart? - No - copy requested - - -  Would patient like information on creating a medical advance directive? - - No - Patient declined - No - patient declined information    Current Medications (verified) Outpatient Encounter Medications as of 07/13/2020  Medication Sig   acetaminophen (TYLENOL) 500 MG  tablet Take 1 tablet (500 mg total) by mouth every 6 (six) hours as needed.   aspirin EC 81 MG tablet Take 81 mg by mouth daily. Swallow whole.   Cholecalciferol (VITAMIN D) 50 MCG (2000 UT) tablet Take 2,000 Units by mouth daily.    glucose blood (ONETOUCH ULTRA) test strip Use as instructed   glucose blood test strip Use as instructed to test blood sugars three times a day DX:E11.65   JARDIANCE 10 MG TABS tablet TAKE 1 TABLET(10 MG) BY MOUTH DAILY BEFORE AND BREAKFAST   meloxicam (MOBIC) 7.5 MG tablet Take 1 tablet (7.5 mg total) by mouth daily.   metFORMIN (GLUCOPHAGE) 1000 MG tablet TAKE 1 TABLET(1000 MG) BY MOUTH TWICE DAILY WITH A MEAL   mometasone (NASONEX) 50 MCG/ACT nasal spray Place 2 sprays into the nose daily.   Multiple Vitamin (MULTIVITAMIN WITH MINERALS) TABS tablet Take 1 tablet by mouth daily. One a Day Men's   Olmesartan-amLODIPine-HCTZ 40-10-12.5 MG TABS TAKE 1 TABLET BY MOUTH DAILY   simvastatin (ZOCOR) 40 MG tablet TAKE 1 TABLET(40 MG) BY MOUTH EVERY EVENING   tamsulosin (FLOMAX) 0.4 MG CAPS capsule Take 0.4 mg by mouth daily.   traMADol (ULTRAM) 50 MG tablet Take 1 tablet (50 mg total) by mouth every 12 (twelve) hours as needed.   valACYclovir (VALTREX) 500 MG tablet Take 500 mg by mouth daily.   No facility-administered encounter medications on file as of 07/13/2020.    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    Diabetes mellitus without complication (HCC)    GERD (gastroesophageal reflux disease)    High cholesterol    Hypertension    Past Surgical History:  Procedure Laterality Date   COLONOSCOPY     GREEN LIGHT LASER TURP (TRANSURETHRAL RESECTION OF PROSTATE N/A 10/21/2014   Procedure: GREEN LIGHT LASER TURP (TRANSURETHRAL RESECTION OF PROSTATE;  Surgeon: Festus Aloe, MD;  Location: [facility];  Service: Urology;  Laterality: N/A;   Family History  Problem Relation Age of Onset   Stroke Mother    Stroke Father    Social History   Socioeconomic History   Marital status: Married    Spouse name: Not on file   Number of children: Not on file   Years of education: Not on file   Highest education level: Not on file  Occupational History   Occupation: retired  Tobacco Use   Smoking status: Never   Smokeless tobacco: Never  Vaping Use   Vaping Use: Never used  Substance and Sexual Activity   Alcohol use: No    Alcohol/week: 0.0 standard drinks   Drug use: No   Sexual activity: Yes  Other Topics Concern   Not on file  Social History Narrative   Not on file   Social Determinants of Health   Financial Resource Strain: Low Risk    Difficulty of Paying Living Expenses: Not hard at all  Food Insecurity: No Food Insecurity   Worried About Charity fundraiser in the Last Year: Never true   [place] in the Last Year: Never true  Transportation Needs: No Transportation Needs   Lack of Transportation (Medical): No   Lack of Transportation (Non-Medical): No  Physical Activity: Sufficiently Active   Days of Exercise per Week: 3 days   Minutes of Exercise per Session: 90 min  Stress: No Stress Concern Present   Feeling of Stress : Not at all  Social Connections: Socially Integrated   Frequency of Communication with Friends and Family: More than three times a week   Frequency of Social Gatherings with Friends and Family: More than three times a week   Attends Religious Services: More than 4 times per year   Active Member of [employer] or Organizations: Yes   Attends Music therapist: More than 4 times per year   Marital Status: Married    Tobacco Counseling Counseling given: Not Answered   Clinical Intake:  Pre-visit preparation completed: Yes  Pain : 0-10 Pain Score: 8  Pain Type: Chronic pain Pain Location: Hip Pain Orientation:  Right Pain Radiating Towards: down leg Pain Descriptors / Indicators: Shooting Pain Onset: More than a month ago Pain Frequency: Constant     Nutritional Status: BMI of 19-24  Normal Nutritional Risks: None Diabetes: Yes  How often do you need to have someone help you when you read instructions, pamphlets, or other written materials from your doctor or pharmacy?: 1 - Never What is the last grade level you completed in school?: 12th grade  Diabetic? Yes Nutrition Risk Assessment:  Has the patient had any N/V/D within the last 2 months?  No  Does the patient have any non-healing wounds?  No  Has the patient had any unintentional weight loss or weight gain?  No   Diabetes:  Is the patient diabetic?  Yes  If diabetic, was a CBG obtained today?  No  Did the patient bring in their glucometer from  home?  No  How often do you monitor your CBG's? daily.   Financial Strains and Diabetes Management:  Are you having any financial strains with the device, your supplies or your medication? No .  Does the patient want to be seen by Chronic Care Management for management of their diabetes?  No  Would the patient like to be referred to a Nutritionist or for Diabetic Management?  No   Diabetic Exams:  Diabetic Eye Exam: Completed 12/30/2019 Diabetic Foot Exam: Completed 10/14/2019   Interpreter Needed?: No  Information entered by :: NAllen LPN   Activities of Daily Living In your present state of health, do you have any difficulty performing the following activities: 07/13/2020 07/12/2020  Hearing? N N  Vision? N N  Difficulty concentrating or making decisions? N N  Walking or climbing stairs? N N  Dressing or bathing? N N  Doing errands, shopping? N N  Preparing Food and eating ? N -  Using the Toilet? N -  In the past six months, have you accidently leaked urine? N -  Do you have problems with loss of bowel control? N -  Managing your Medications? N -  Managing your Finances?  N -  Housekeeping or managing your Housekeeping? N -  Some recent data might be hidden    Patient Care Team: [name], FNP as PCP - General (General Practice) Mayford Knife, [facility] (Pharmacist)  Indicate any recent Medical Services you may have received from other than [facility] providers in the past year (date may be approximate).     Assessment:   This is a routine wellness examination for [place].  Hearing/Vision screen No results found.  Dietary issues and exercise activities discussed: Current Exercise Habits: Home exercise routine, Type of exercise: treadmill;strength training/weights (stationary bike), Time (Minutes): > 60, Frequency (Times/Week): 3, Weekly Exercise (Minutes/Week): 0   Goals Addressed             This Visit's Progress    Patient Stated       07/13/2020, wants to start walking outside again        Depression Screen PHQ 2/9 Scores 07/13/2020 07/12/2020 07/08/2019 09/30/2018 06/25/2018 01/19/2018 12/19/2017  PHQ - 2 Score 0 0 0 0 0 0 0  PHQ- 9 Score - 0 0 - 0 - -    Fall Risk Fall Risk  07/13/2020 07/12/2020 07/08/2019 09/30/2018 06/25/2018  Falls in the past year? 0 0 0 0 0  Number falls in past yr: - 0 - - -  Injury with Fall? - 0 - - -  Risk for fall due to : Medication side effect - Medication side effect - Medication side effect  Follow up Falls evaluation completed;Education provided;Falls prevention discussed - Falls evaluation completed;Education provided;Falls prevention discussed - Falls prevention discussed;Education provided    FALL RISK PREVENTION PERTAINING TO THE HOME:  Any stairs in or around the home? Yes  If so, are there any without handrails? No  Home free of loose throw rugs in walkways, pet beds, electrical cords, etc? Yes  Adequate lighting in your home to reduce risk of falls? Yes   ASSISTIVE DEVICES UTILIZED TO PREVENT FALLS:  Life alert? No  Use of a cane, walker or w/c? No  Grab bars in the bathroom? No  Shower chair or  bench in shower? No  Elevated toilet seat or a handicapped toilet? No   TIMED UP AND GO:  Was the test performed? No .  Cognitive Function:     6CIT Screen 07/13/2020 07/08/2019 06/25/2018  What Year? 0 points 0 points 0 points  What month? 0 points 0 points 0 points  What time? 0 points 0 points 0 points  Count back from 20 0 points 0 points 0 points  Months in reverse 4 points 0 points 0 points  Repeat phrase 6 points 4 points 2 points  Total Score 10 4 2     Immunizations Immunization History  Administered Date(s) Administered   Fluad Quad(high Dose 65+) 09/30/2018, 10/19/2019   Influenza, High Dose Seasonal PF 10/10/2017   Influenza-Unspecified 11/10/2017   PFIZER(Purple Top)SARS-COV-2 Vaccination 02/19/2019, 03/12/2019, 11/13/2019, 12/14/2019, 04/25/2020   Pneumococcal Conjugate-13 09/30/2018   Pneumococcal-Unspecified 11/02/2012, 02/09/2014   Tdap 11/02/2012    TDAP status: Up to date  Flu Vaccine status: Up to date  Pneumococcal vaccine status: Up to date  Covid-19 vaccine status: Completed vaccines  Qualifies for Shingles Vaccine? Yes   Zostavax completed No   Shingrix Completed?: No.    Education has been provided regarding the importance of this vaccine. Patient has been advised to call insurance company to determine out of pocket expense if they have not yet received this vaccine. Advised may also receive vaccine at local pharmacy or Health Dept. Verbalized acceptance and understanding.  Screening Tests Health Maintenance  Topic Date Due   Zoster Vaccines- Shingrix (1 of 2) Never done   INFLUENZA VACCINE  08/14/2020   FOOT EXAM  10/13/2020   OPHTHALMOLOGY EXAM  12/29/2020   HEMOGLOBIN A1C  01/11/2021   TETANUS/TDAP  11/03/2022   COLONOSCOPY (Pts 45-67yrs Insurance coverage will need to be confirmed)  03/05/2024   COVID-19 Vaccine  Completed   Hepatitis C Screening  Completed   PNA vac Low Risk Adult  Completed   HPV VACCINES  Aged Out     Health Maintenance  Health Maintenance Due  Topic Date Due   Zoster Vaccines- Shingrix (1 of 2) Never done    Colorectal cancer screening: Type of screening: Colonoscopy. Completed 03/05/2014. Repeat every 10 years  Lung Cancer Screening: (Low Dose CT Chest recommended if Age 75-80 years, 30 pack-year currently smoking OR have quit w/in 15years.) does not qualify.   Lung Cancer Screening Referral: no   Additional Screening:  Hepatitis C Screening: does qualify; Completed 06/12/2016  Vision Screening: Recommended annual ophthalmology exams for early detection of glaucoma and other disorders of the eye. Is the patient up to date with their annual eye exam?  Yes  Who is the provider or what is the name of the office in which the patient attends annual eye exams? Dr. Venetia Maxon If pt is not established with a provider, would they like to be referred to a provider to establish care? No .   Dental Screening: Recommended annual dental exams for proper oral hygiene  Community Resource Referral / Chronic Care Management: CRR required this visit?  No   CCM required this visit?  No      Plan:     I have personally reviewed and noted the following in the patient's chart:   Medical and social history Use of alcohol, tobacco or illicit drugs  Current medications and supplements including opioid prescriptions. Patient is not currently taking opioid prescriptions. Functional ability and status Nutritional status Physical activity Advanced directives List of other physicians Hospitalizations, surgeries, and ER visits in previous 12 months Vitals Screenings to include cognitive, depression, and falls Referrals and appointments  In addition, I have reviewed and discussed with  patient certain preventive protocols, quality metrics, and best practice recommendations. A written personalized care plan for preventive services as well as general preventive health recommendations were  provided to patient.     Kellie Simmering, LPN   [id number]   Nurse Notes:

## 2020-07-14 ENCOUNTER — Telehealth: Payer: Self-pay

## 2020-07-14 NOTE — Chronic Care Management (AMB) (Signed)
  No answer, left message of telephone appointment with [name] CPP on 07-18-2020 at 2:00. Left message to have all medications, supplements, blood pressure and/or blood sugar logs available during appointment and to return call if need to reschedule.   [place] Pharmacist Assistant [phone]

## 2020-07-18 ENCOUNTER — Ambulatory Visit (INDEPENDENT_AMBULATORY_CARE_PROVIDER_SITE_OTHER): Payer: PPO

## 2020-07-18 DIAGNOSIS — I10 Essential (primary) hypertension: Secondary | ICD-10-CM | POA: Diagnosis not present

## 2020-07-18 DIAGNOSIS — E782 Mixed hyperlipidemia: Secondary | ICD-10-CM

## 2020-07-18 NOTE — Progress Notes (Signed)
Chronic Care Management Pharmacy Note  07/19/2020 Name:  Ricky Shields MRN:  [id number] DOB:  1945-03-05  Summary: Patient reports that he has been doing well. He is taking his medication every day.   Recommendations/Changes made from today's visit: Recommend patient receive shingrix vaccine Recommend patient continue to check BP at least three times per week.   Plan: Patient to have shingles vaccine completed.  Log and document BP readings and bring to future visits.    Subjective: Ricky Shields is an 75 y.o. year old male who is a primary patient of [name], [place].  The CCM team was consulted for assistance with disease management and care coordination needs.    Engaged with patient by telephone for follow up visit in response to provider referral for pharmacy case management and/or care coordination services.   Consent to Services:  The patient was given information about Chronic Care Management services, agreed to services, and gave verbal consent prior to initiation of services.  Please see initial visit note for detailed documentation.   Patient Care Team: [name], FNP as PCP - General (General Practice) Mayford Knife, [facility] (Pharmacist)  Recent office visits: 07/12/2020 PCP OV  Recent consult visits: 03/07/2020 Orthopedics OV: no changes   Hospital visits: None in previous 6 months   Objective:  Lab Results  Component Value Date   CREATININE 1.09 07/12/2020   BUN 15 07/12/2020   GFRNONAA 60 01/10/2020   GFRAA 70 01/10/2020   NA 140 07/12/2020   K 4.6 07/12/2020   CALCIUM 10.5 (H) 07/12/2020   CO2 22 07/12/2020   GLUCOSE 126 (H) 07/12/2020    Lab Results  Component Value Date/Time   HGBA1C 6.8 (H) 07/12/2020 10:39 AM   HGBA1C 6.8 (H) 04/10/2020 12:50 PM   MICROALBUR 30 07/08/2019 12:51 PM   MICROALBUR 80 06/25/2018 12:39 PM    Last diabetic Eye exam:  Lab Results  Component Value Date/Time   HMDIABEYEEXA No Retinopathy 12/30/2019  12:00 AM    Last diabetic Foot exam: No results found for: HMDIABFOOTEX   Lab Results  Component Value Date   CHOL 146 07/12/2020   HDL 50 07/12/2020   LDLCALC 79 07/12/2020   TRIG 90 07/12/2020   CHOLHDL 2.9 07/12/2020    Hepatic Function Latest Ref Rng & Units 07/12/2020 10/14/2019 07/08/2019  Total Protein 6.0 - 8.5 g/dL 7.5 7.2 7.6  Albumin 3.7 - 4.7 g/dL 5.0(H) 4.6 4.7  AST 0 - 40 IU/L '20 19 22  ' ALT 0 - 44 IU/L '14 19 21  ' Alk Phosphatase 44 - 121 IU/L 73 71 81  Total Bilirubin 0.0 - 1.2 mg/dL 0.7 0.6 0.4    No results found for: TSH, FREET4  CBC Latest Ref Rng & Units 07/12/2020 07/08/2019 06/25/2018  WBC 3.4 - 10.8 x10E3/uL 5.9 6.4 5.5  Hemoglobin 13.0 - 17.7 g/dL 14.9 14.3 13.9  Hematocrit 37.5 - 51.0 % 43.2 39.8 37.6  Platelets 150 - 450 x10E3/uL 262 250 229    No results found for: VD25OH  Clinical ASCVD: No  The 10-year ASCVD risk score Mikey Bussing DC Jr., et al., 2013) is: 31.1%   Values used to calculate the score:     Age: 27 years     Sex: Male     Is Non-Hispanic African American: Yes     Diabetic: Yes     Tobacco smoker: No     Systolic Blood Pressure: 940 mmHg     Is BP treated: No  HDL Cholesterol: 50 mg/dL     Total Cholesterol: 146 mg/dL    Depression screen [facility] 2/9 07/13/2020 07/12/2020 07/08/2019  Decreased Interest 0 0 0  Down, Depressed, Hopeless 0 0 0  PHQ - 2 Score 0 0 0  Altered sleeping - 0 0  Tired, decreased energy - 0 0  Change in appetite - 0 0  Feeling bad or failure about yourself  - 0 0  Trouble concentrating - 0 0  Moving slowly or fidgety/restless - 0 0  Suicidal thoughts - 0 0  PHQ-9 Score - 0 0  Difficult doing work/chores - - Not difficult at all      Social History   Tobacco Use  Smoking Status Never  Smokeless Tobacco Never   BP Readings from Last 3 Encounters:  07/12/20 130/76  04/10/20 138/86  03/07/20 132/60   Pulse Readings from Last 3 Encounters:  07/12/20 96  04/10/20 88  03/07/20 92   Wt Readings from  Last 3 Encounters:  07/13/20 155 lb (70.3 kg)  07/12/20 155 lb 9.6 oz (70.6 kg)  04/10/20 160 lb 12.8 oz (72.9 kg)   BMI Readings from Last 3 Encounters:  07/13/20 22.89 kg/m  07/12/20 24.52 kg/m  04/10/20 23.75 kg/m    Assessment/Interventions: Review of patient past medical history, allergies, medications, health status, including review of consultants reports, laboratory and other test data, was performed as part of comprehensive evaluation and provision of chronic care management services.   SDOH:  (Social Determinants of Health) assessments and interventions performed: No  SDOH Screenings   Alcohol Screen: Not on file  Depression (PHQ2-9): Low Risk    PHQ-2 Score: 0  Financial Resource Strain: Low Risk    Difficulty of Paying Living Expenses: Not hard at all  Food Insecurity: No Food Insecurity   Worried About Charity fundraiser in the Last Year: Never true   Ran Out of Food in the Last Year: Never true  Housing: Low Risk    Last Housing Risk Score: 0  Physical Activity: Sufficiently Active   Days of Exercise per Week: 3 days   Minutes of Exercise per Session: 90 min  Social Connections: Engineer, building services of Communication with Friends and Family: More than three times a week   Frequency of Social Gatherings with Friends and Family: More than three times a week   Attends Religious Services: More than 4 times per year   Active Member of [employer] or Organizations: Yes   Attends Music therapist: More than 4 times per year   Marital Status: Married  Stress: No Stress Concern Present   Feeling of Stress : Not at all  Tobacco Use: Low Risk    Smoking Tobacco Use: Never   Smokeless Tobacco Use: Never  Transportation Needs: No Transportation Needs   Lack of Transportation (Medical): No   Lack of Transportation (Non-Medical): No    CCM Care Plan  Allergies  Allergen Reactions   Penicillins Rash    Has patient had a PCN reaction causing  immediate rash, facial/tongue/throat swelling, SOB or lightheadedness with hypotension: No Has patient had a PCN reaction causing severe rash involving mucus membranes or skin necrosis: No Has patient had a PCN reaction that required hospitalization No Has patient had a PCN reaction occurring within the last 10 years: No If all of the above answers are "NO", then may proceed with Cephalosporin use.     Medications Reviewed Today     Reviewed by  Mayford Knife, RPH (Pharmacist) on 07/18/20 at 1310  Med List Status: <None>   Medication Order Taking? Sig Documenting Provider Last Dose Status Informant  acetaminophen (TYLENOL) 500 MG tablet [id number] No Take 1 tablet (500 mg total) by mouth every 6 (six) hours as needed. [name], FNP Taking Active   aspirin EC 81 MG tablet [id number] No Take 81 mg by mouth daily. Swallow whole. [provider] Taking Active   Cholecalciferol (VITAMIN D) 50 MCG (2000 UT) tablet [id number] No Take 2,000 Units by mouth daily.  [provider] Taking Active Self  glucose blood (ONETOUCH ULTRA) test strip [id number] No Use as instructed [name], FNP Taking Active   glucose blood test strip [id number] No Use as instructed to test blood sugars three times a day DX:E11.65 [name], FNP Taking Active   JARDIANCE 10 MG TABS tablet [id number] No TAKE 1 TABLET(10 MG) BY MOUTH DAILY BEFORE AND Evelena Peat, FNP Taking Active   meloxicam (MOBIC) 7.5 MG tablet [id number] No Take 1 tablet (7.5 mg total) by mouth daily. [name], FNP Taking Active   metFORMIN (GLUCOPHAGE) 1000 MG tablet [id number] No TAKE 1 TABLET(1000 MG) BY MOUTH TWICE DAILY WITH A MEAL [name], FNP Taking Active   mometasone (NASONEX) 50 MCG/ACT nasal spray [id number] No Place 2 sprays into the nose daily. [name], FNP Taking Expired 07/12/20 2359   Multiple Vitamin (MULTIVITAMIN WITH MINERALS) TABS tablet [id number] No Take 1 tablet by mouth daily. One a  Day Men's [provider] Taking Active Self  Olmesartan-amLODIPine-HCTZ 40-10-12.5 MG TABS [id number] No TAKE 1 TABLET BY MOUTH DAILY [name], FNP Taking Active   simvastatin (ZOCOR) 40 MG tablet [id number] No TAKE 1 TABLET(40 MG) BY MOUTH EVERY Fredric Mare, MD Taking Active   tamsulosin [facility]) 0.4 MG CAPS capsule [id number] No Take 0.4 mg by mouth daily. [provider] Taking Active            Med Note Tressie Ellis B   Sat Jan 27, 2016 11:57 AM)    traMADol (ULTRAM) 50 MG tablet [id number] No Take 1 tablet (50 mg total) by mouth every 12 (twelve) hours as needed.  Patient not taking: Reported on 07/13/2020   Marybelle Killings, MD Not Taking Active   valACYclovir (VALTREX) 500 MG tablet [id number] No Take 500 mg by mouth daily. [provider] Taking Active             Patient Active Problem List   Diagnosis Date Noted   Spinal stenosis of lumbar region 02/17/2020   Tingling of skin 03/20/2018   Type 2 diabetes mellitus without complication, without long-term current use of insulin ([place]) 12/19/2017   Essential hypertension 12/19/2017    Immunization History  Administered Date(s) Administered   Fluad Quad(high Dose 65+) 09/30/2018, 10/19/2019   Influenza, High Dose Seasonal PF 10/10/2017   Influenza-Unspecified 11/10/2017   PFIZER(Purple Top)SARS-COV-2 Vaccination 02/19/2019, 03/12/2019, 11/13/2019, 12/14/2019, 04/25/2020   Pneumococcal Conjugate-13 09/30/2018   Pneumococcal-Unspecified 11/02/2012, 02/09/2014   Tdap 11/02/2012    Conditions to be addressed/monitored:  Hypertension, Hyperlipidemia, and Diabetes  Care Plan : [place]  Updates made by Mayford Knife, [place] since 07/19/2020 12:00 AM     Problem: HTN, HLD, DM, PAIN   Priority: High     Long-Range Goal: Disease Management   Start Date: 02/24/2020  Recent Progress: On track  Priority: High  Note:    Current Barriers:  Unable to independently monitor  therapeutic  efficacy  Pharmacist Clinical Goal(s):  Patient will achieve adherence to monitoring guidelines and medication adherence to achieve therapeutic efficacy through collaboration with PharmD and provider.   Interventions: 1:1 collaboration with [name], FNP regarding development and update of comprehensive plan of care as evidenced by provider attestation and co-signature Inter-disciplinary care team collaboration (see longitudinal plan of care) Comprehensive medication review performed; medication list updated in electronic medical record  Hypertension (BP goal <130/80) -Controlled -Current treatment: Olmesartan-Amlodipine -HCTZ 40-10-12.5 mg taking 1 tablet by mouth daily -Current home readings: readings have been good, 07/16/2020 - 120/74 -Current dietary habits: very seldom using Morton salt. Trying to increase vegetables and include with each meal.  -Current exercise habits: Walking on the treadmill and riding his stationary bicycle. Per patient orthopedic doctor asked him to walk more often then three times per wee 30 minutes on the treadmill and 30 minutes on his stationary bike. He also uses his dumb bells.  -Denies hypotensive/hypertensive symptoms -Educated on Importance of home blood pressure monitoring; Proper BP monitoring technique; -Counseled to monitor BP at home 3 times per week, document, and provide log at future appointments -Recommended to continue current medication  Hyperlipidemia: (LDL goal < 70) -Controlled -Current treatment: Simvastatin 40 mg taking 1 tablet by mouth daily  -Current dietary patterns: patient reports sometimes eating fried fatty foods. -Reducing fried fatty foods to once every week.  -Current exercise habits: Walking on the treadmill and riding his stationary bicycle  -Educated on Benefits of statin for ASCVD risk reduction; Importance of limiting foods high in cholesterol; Exercise goal of 150 minutes per week; -Recommended to  continue current medication  Patient Goals/Self-Care Activities Over the next 90  days, patient will:  - take medications as prescribed target a minimum of 150 minutes of moderate intensity exercise weekly  Follow Up Plan: Telephone follow up appointment with care management team member scheduled for: 12/26/2020       Medication Assistance: None required.  Patient affirms current coverage meets needs.  Compliance/Adherence/Medication fill history: Care Gaps: Shingrix Vaccine   Star-Rating Drugs: Simvastatin 40 mg  Olmesartan 40 mg Jardiance 10 mg   Patient's preferred pharmacy is:  [id number] [address], [address] [place] [address] Phone: [phone] Fax: [phone]  [facility] [phone] [place], [place] - [place] [address] Phone: [phone] Fax: [phone]  Uses pill box? Yes Pt endorses 90% compliance  We discussed: Benefits of medication synchronization, packaging and delivery as well as enhanced pharmacist oversight with [employer]. Patient decided to: Continue current medication management strategy  Care Plan and Follow Up Patient Decision:  Patient agrees to Care Plan and Follow-up.  Plan: The patient has been provided with contact information for the care management team and has been advised to call with any health related questions or concerns.   [name], PharmD Clinical Pharmacist [facility] [phone]

## 2020-07-19 NOTE — Patient Instructions (Addendum)
Visit Information It was great speaking with you today!  Please let me know if you have any questions about our visit.   Goals Addressed             This Visit's Progress    Monitor and Manage My Blood Sugar-Diabetes Type 2       Timeframe:  Long-Range Goal Priority:  High Start Date:                             Expected End Date:                       Follow Up Date 12/26/2020   - check blood sugar at prescribed times - check blood sugar if I feel it is too high or too low - take the blood sugar log to all doctor visits    Why is this important?   Checking your blood sugar at home helps to keep it from getting very high or very low.  Writing the results in a diary or log helps the doctor know how to care for you.  Your blood sugar log should have the time, date and the results.  Also, write down the amount of insulin or other medicine that you take.  Other information, like what you ate, exercise done and how you were feeling, will also be helpful.              Patient Care Plan: CCM Pharmacy Care Plan     Problem Identified: HTN, HLD, DM, PAIN   Priority: High     Long-Range Goal: Disease Management   Start Date: 02/24/2020  Recent Progress: On track  Priority: High  Note:    Current Barriers:  Unable to independently monitor therapeutic efficacy  Pharmacist Clinical Goal(s):  Patient will achieve adherence to monitoring guidelines and medication adherence to achieve therapeutic efficacy through collaboration with PharmD and provider.   Interventions: 1:1 collaboration with [name], FNP regarding development and update of comprehensive plan of care as evidenced by provider attestation and co-signature Inter-disciplinary care team collaboration (see longitudinal plan of care) Comprehensive medication review performed; medication list updated in electronic medical record  Hypertension (BP goal <130/80) -Controlled -Current  treatment: Olmesartan-Amlodipine -HCTZ 40-10-12.5 mg taking 1 tablet by mouth daily -Current home readings: readings have been good, 07/16/2020 - 120/74 -Current dietary habits: very seldom using Morton salt. Trying to increase vegetables and include with each meal.  -Current exercise habits: Walking on the treadmill and riding his stationary bicycle. Per patient orthopedic doctor asked him to walk more often then three times per wee 30 minutes on the treadmill and 30 minutes on his stationary bike. He also uses his dumb bells.  -Denies hypotensive/hypertensive symptoms -Educated on Importance of home blood pressure monitoring; Proper BP monitoring technique; -Counseled to monitor BP at home 3 times per week, document, and provide log at future appointments -Recommended to continue current medication  Hyperlipidemia: (LDL goal < 70) -Controlled -Current treatment: Simvastatin 40 mg taking 1 tablet by mouth daily  -Current dietary patterns: patient reports sometimes eating fried fatty foods. -Reducing fried fatty foods to once every week.  -Current exercise habits: Walking on the treadmill and riding his stationary bicycle  -Educated on Benefits of statin for ASCVD risk reduction; Importance of limiting foods high in cholesterol; Exercise goal of 150 minutes per week; -Recommended to continue current medication  Patient Goals/Self-Care Activities Over the  next 90  days, patient will:  - take medications as prescribed target a minimum of 150 minutes of moderate intensity exercise weekly  Follow Up Plan: Telephone follow up appointment with care management team member scheduled for: 12/26/2020       Patient agreed to services and verbal consent obtained.   The patient verbalized understanding of instructions, educational materials, and care plan provided today and agreed to receive a mailed copy of patient instructions, educational materials, and care plan.   Ricky Shields,  PharmD Clinical Pharmacist [facility] [phone]

## 2020-07-21 ENCOUNTER — Other Ambulatory Visit: Payer: Self-pay | Admitting: Internal Medicine

## 2020-09-05 ENCOUNTER — Ambulatory Visit (INDEPENDENT_AMBULATORY_CARE_PROVIDER_SITE_OTHER): Payer: PPO | Admitting: Orthopaedic Surgery

## 2020-09-05 ENCOUNTER — Encounter: Payer: Self-pay | Admitting: Orthopaedic Surgery

## 2020-09-05 VITALS — BP 129/65 | Ht 69.0 in | Wt 160.0 lb

## 2020-09-05 DIAGNOSIS — M48061 Spinal stenosis, lumbar region without neurogenic claudication: Secondary | ICD-10-CM | POA: Diagnosis not present

## 2020-09-05 NOTE — Progress Notes (Signed)
Office Visit Note   Patient: Ricky Shields           Date of Birth: 01-08-1946           MRN: [id number] Visit Date: 09/05/2020              Requested by: [name], [facility] [place],  [place] [address] PCP: [name], FNP   Assessment & Plan: Visit Diagnoses:  1. Spinal stenosis of lumbar region without neurogenic claudication     Plan: Patient has severe stenosis.  He still able to stand and walk in the community.  We reviewed images if he gets increasing symptoms he can return earlier otherwise recheck 6 months.  Follow-Up Instructions: Return in about 6 months (around 03/08/2021).   Orders:  No orders of the defined types were placed in this encounter.  No orders of the defined types were placed in this encounter.     Procedures: No procedures performed   Clinical Data: No additional findings.   Subjective: Chief Complaint  Patient presents with   Lower Back - Pain, Follow-up    HPI 75 year old male 9-monthfollow-up for low back pain.  He states he has pain into both buttocks down both legs mid to lower leg right worse than left.  He was given some meloxicam by his PCP.  He has tried some turmeric tea.  He has tried Tylenol.  Patient does have diabetes.  Pain is better when he is sitting or if he lays down.  He has pain every day.  Cannot walk as far as he previously could.  He can ride a stationary bike.  Increased pain with prolonged standing.  MRI scan showed severe spinal stenosis at L3-4 and L 4-5.  Degenerative lumbar curve noted on AP x-ray and loss of normal lumbar lordosis on the lateral radiograph.  He continues to be able to walk at times up to 2 miles.  Other times he has more pain that radiates into his buttocks and legs.  Patient does have diabetes. Last A1c 6.8..Marland KitchenReview of Systems positive for controlled hypertension on medication type 2 diabetes.  All other systems noncontributory no associated bowel bladder symptoms no  fever or chills.   Objective: Vital Signs: BP 129/65   Ht '5\' 9"'$  (1.753 m)   Wt 160 lb (72.6 kg)   BMI 23.63 kg/m   Physical Exam Constitutional:      Appearance: He is well-developed.  HENT:     Head: Normocephalic and atraumatic.     Right Ear: External ear normal.     Left Ear: External ear normal.  Eyes:     Pupils: Pupils are equal, round, and reactive to light.  Neck:     Thyroid: No thyromegaly.     Trachea: No tracheal deviation.  Cardiovascular:     Rate and Rhythm: Normal rate.  Pulmonary:     Effort: Pulmonary effort is normal.     Breath sounds: No wheezing.  Abdominal:     General: Bowel sounds are normal.     Palpations: Abdomen is soft.  Musculoskeletal:     Cervical back: Neck supple.  Skin:    General: Skin is warm and dry.     Capillary Refill: Capillary refill takes less than 2 seconds.  Neurological:     Mental Status: He is alert and oriented to person, place, and time.  Psychiatric:        Behavior: Behavior normal.  Thought Content: Thought content normal.        Judgment: Judgment normal.    Ortho Exam some pain with straight leg raising right left at 70 degrees.  Knee and ankle jerk are intact anterior tib dorsiflexion is intact trace EHL weakness on the right.  He is able to toe walk.  Specialty Comments:  No specialty comments available.  Imaging: No results found.   PMFS History: Patient Active Problem List   Diagnosis Date Noted   Spinal stenosis of lumbar region 02/17/2020   Tingling of skin 03/20/2018   Type 2 diabetes mellitus without complication, without long-term current use of insulin ([place]) 12/19/2017   Essential hypertension 12/19/2017   Past Medical History:  Diagnosis Date   Back pain    intermittent-prior military injury   BPH (benign prostatic hypertrophy) with urinary retention    Diabetes mellitus without complication (HCC)    GERD (gastroesophageal reflux disease)    High cholesterol    Hypertension      Family History  Problem Relation Age of Onset   Stroke Mother    Stroke Father     Past Surgical History:  Procedure Laterality Date   COLONOSCOPY     GREEN LIGHT LASER TURP (TRANSURETHRAL RESECTION OF PROSTATE N/A 10/21/2014   Procedure: GREEN LIGHT LASER TURP (TRANSURETHRAL RESECTION OF PROSTATE;  Surgeon: Festus Aloe, MD;  Location: [place];  Service: Urology;  Laterality: N/A;   Social History   Occupational History   Occupation: retired  Tobacco Use   Smoking status: Never   Smokeless tobacco: Never  Vaping Use   Vaping Use: Never used  Substance and Sexual Activity   Alcohol use: No    Alcohol/week: 0.0 standard drinks   Drug use: No   Sexual activity: Yes

## 2020-09-12 ENCOUNTER — Other Ambulatory Visit: Payer: Self-pay | Admitting: Nurse Practitioner

## 2020-10-05 ENCOUNTER — Telehealth: Payer: Self-pay

## 2020-10-05 NOTE — Chronic Care Management (AMB) (Signed)
Chronic Care Management Pharmacy Assistant   Name: Ricky Shields  MRN: [id number] DOB: January 06, 1946  Reason for Encounter: Disease State/ Hypertension  Recent office visits:  None  Recent consult visits:  09-05-2020 Marybelle Killings, MD (Ortho). Follow up in 6 months  Hospital visits:  None in previous 6 months  Medications: Outpatient Encounter Medications as of 10/05/2020  Medication Sig   acetaminophen (TYLENOL) 500 MG tablet Take 1 tablet (500 mg total) by mouth every 6 (six) hours as needed.   aspirin EC 81 MG tablet Take 81 mg by mouth daily. Swallow whole.   Cholecalciferol (VITAMIN D) 50 MCG (2000 UT) tablet Take 2,000 Units by mouth daily.    glucose blood (ONETOUCH ULTRA) test strip Use as instructed   glucose blood test strip Use as instructed to test blood sugars three times a day DX:E11.65   JARDIANCE 10 MG TABS tablet TAKE 1 TABLET(10 MG) BY MOUTH DAILY BEFORE AND BREAKFAST   meloxicam (MOBIC) 7.5 MG tablet Take 1 tablet (7.5 mg total) by mouth daily.   metFORMIN (GLUCOPHAGE) 1000 MG tablet TAKE 1 TABLET(1000 MG) BY MOUTH TWICE DAILY WITH A MEAL   mometasone (NASONEX) 50 MCG/ACT nasal spray Place 2 sprays into the nose daily.   Multiple Vitamin (MULTIVITAMIN WITH MINERALS) TABS tablet Take 1 tablet by mouth daily. One a Day Men's   Olmesartan-amLODIPine-HCTZ 40-10-12.5 MG TABS TAKE 1 TABLET BY MOUTH DAILY   simvastatin (ZOCOR) 40 MG tablet TAKE 1 TABLET(40 MG) BY MOUTH EVERY EVENING   tamsulosin (FLOMAX) 0.4 MG CAPS capsule Take 0.4 mg by mouth daily.   traMADol (ULTRAM) 50 MG tablet Take 1 tablet (50 mg total) by mouth every 12 (twelve) hours as needed. (Patient not taking: No sig reported)   valACYclovir (VALTREX) 500 MG tablet Take 500 mg by mouth daily.   No facility-administered encounter medications on file as of 10/05/2020.  Reviewed chart prior to disease state call. Spoke with patient regarding BP  Recent Office Vitals: BP Readings from Last 3 Encounters:   09/05/20 129/65  07/12/20 130/76  04/10/20 138/86   Pulse Readings from Last 3 Encounters:  07/12/20 96  04/10/20 88  03/07/20 92    Wt Readings from Last 3 Encounters:  09/05/20 160 lb (72.6 kg)  07/13/20 155 lb (70.3 kg)  07/12/20 155 lb 9.6 oz (70.6 kg)     Kidney Function Lab Results  Component Value Date/Time   CREATININE 1.09 07/12/2020 10:39 AM   CREATININE 1.06 04/10/2020 12:50 PM   GFRNONAA 60 01/10/2020 12:24 PM   GFRAA 70 01/10/2020 12:24 PM    BMP Latest Ref Rng & Units 07/12/2020 04/10/2020 01/10/2020  Glucose 65 - 99 mg/dL 126(H) 115(H) 126(H)  BUN 8 - 27 mg/dL 15 15 17   Creatinine 0.76 - 1.27 mg/dL 1.09 1.06 1.19  BUN/Creat Ratio 10 - 24 14 14 14   Sodium 134 - 144 mmol/L 140 139 141  Potassium 3.5 - 5.2 mmol/L 4.6 4.7 4.4  Chloride 96 - 106 mmol/L 99 100 102  CO2 20 - 29 mmol/L 22 22 22   Calcium 8.6 - 10.2 mg/dL 10.5(H) 10.2 9.8    Current antihypertensive regimen:  Olmesartan-Amlodipine -HCTZ 40-10-12.5 mg daily  How often are you checking your Blood Pressure? daily  Current home BP readings: Patient states last reading was normal but wasn't around blood pressure log.  What recent interventions/DTPs have been made by any provider to improve Blood Pressure control since last CPP Visit:  Educated on Importance  of home blood pressure monitoring; Proper BP monitoring technique; Counseled to monitor BP at home 3 times per week, document, and provide log at future appointments  Any recent hospitalizations or ED visits since last visit with CPP? No  What diet changes have been made to improve Blood Pressure Control?  Patient states he has limited his salt intake, eats plenty of vegetable/fruits and drinks plenty of water.  What exercise is being done to improve your Blood Pressure Control?  Patient states he uses the treadmill, walks and lifts weights.  Adherence Review: Is the patient currently on ACE/ARB medication? Yes Does the patient have >5 day  gap between last estimated fill dates? No   Care Gaps: Shingrix overdue Flu vaccine overdue AWV 08-09-2021 09-05-2020 BP 129/65  Star Rating Drugs: Simvastatin 40 mg- Last filled 07-24-2020 90 DS [name] Olmesartan 40 mg- Last filled 09-12-2020 90 DS [name] Jardiance 10 mg- Last filled 09-10-2020 30 DS [name] Metformin 1000 mg- Last filled 09-13-2020 90 DS [facility] Clinical Pharmacist Assistant [phone]

## 2020-10-12 ENCOUNTER — Telehealth: Payer: Self-pay

## 2020-10-12 NOTE — Chronic Care Management (AMB) (Signed)
Error  [place] Clinical Pharmacist Assistant [phone]

## 2020-10-19 ENCOUNTER — Other Ambulatory Visit: Payer: Self-pay | Admitting: Internal Medicine

## 2020-10-24 ENCOUNTER — Other Ambulatory Visit: Payer: Self-pay | Admitting: Nurse Practitioner

## 2020-10-24 DIAGNOSIS — M25559 Pain in unspecified hip: Secondary | ICD-10-CM

## 2020-10-24 DIAGNOSIS — M48061 Spinal stenosis, lumbar region without neurogenic claudication: Secondary | ICD-10-CM

## 2020-11-16 ENCOUNTER — Other Ambulatory Visit: Payer: Self-pay

## 2020-11-16 ENCOUNTER — Encounter: Payer: Self-pay | Admitting: Nurse Practitioner

## 2020-11-16 ENCOUNTER — Ambulatory Visit (INDEPENDENT_AMBULATORY_CARE_PROVIDER_SITE_OTHER): Payer: PPO | Admitting: Nurse Practitioner

## 2020-11-16 VITALS — BP 124/82 | HR 85 | Temp 98.5°F | Ht 69.0 in | Wt 160.6 lb

## 2020-11-16 DIAGNOSIS — E782 Mixed hyperlipidemia: Secondary | ICD-10-CM

## 2020-11-16 DIAGNOSIS — M48061 Spinal stenosis, lumbar region without neurogenic claudication: Secondary | ICD-10-CM | POA: Diagnosis not present

## 2020-11-16 DIAGNOSIS — E1169 Type 2 diabetes mellitus with other specified complication: Secondary | ICD-10-CM | POA: Diagnosis not present

## 2020-11-16 DIAGNOSIS — M25559 Pain in unspecified hip: Secondary | ICD-10-CM | POA: Diagnosis not present

## 2020-11-16 DIAGNOSIS — E119 Type 2 diabetes mellitus without complications: Secondary | ICD-10-CM

## 2020-11-16 DIAGNOSIS — I10 Essential (primary) hypertension: Secondary | ICD-10-CM | POA: Diagnosis not present

## 2020-11-16 DIAGNOSIS — Z23 Encounter for immunization: Secondary | ICD-10-CM

## 2020-11-16 MED ORDER — KETOROLAC TROMETHAMINE 60 MG/2ML IM SOLN
60.0000 mg | Freq: Once | INTRAMUSCULAR | Status: AC
Start: 2020-11-16 — End: 2020-11-16
  Administered 2020-11-16: 60 mg via INTRAMUSCULAR

## 2020-11-16 MED ORDER — ZOSTER VAC RECOMB ADJUVANTED 50 MCG/0.5ML IM SUSR: 0.5000 mL | Freq: Once | INTRAMUSCULAR | 0 refills | Status: AC

## 2020-11-16 NOTE — Patient Instructions (Signed)

## 2020-11-16 NOTE — Progress Notes (Signed)
I,Yamilka J Llittleton,acting as a Education administrator for [employer], FNP.,have documented all relevant documentation on the behalf of [name], FNP,as directed by  [name], FNP while in the presence of [name], [place].   This visit occurred during the SARS-CoV-2 public health emergency.  Safety protocols were in place, including screening questions prior to the visit, additional usage of staff PPE, and extensive cleaning of exam room while observing appropriate contact time as indicated for disinfecting solutions.  Subjective:     Patient ID: Ricky Shields , male    DOB: Jan 01, 1946 , 75 y.o.   MRN: [id number]   Chief Complaint  Patient presents with   Diabetes   Hypertension    HPI  The patient is here today for a diabetes and blood pressure f/u.  Patient stated he has been having hip and back pain. He would like to get an injection. He stated his orthopardic recommended him getting surgery but he does not want to do it especially not with that provider. He does not want to see Dr. [name] any longer.    Wt Readings from Last 3 Encounters: 11/16/20 : 160 lb 9.6 oz (72.8 kg) 09/05/20 : 160 lb (72.6 kg) 07/13/20 : 155 lb (70.3 kg)    Diabetes He presents for his follow-up diabetic visit. He has type 2 diabetes mellitus. His disease course has been stable. There are no hypoglycemic associated symptoms. Pertinent negatives for hypoglycemia include no dizziness or headaches. Pertinent negatives for diabetes include no blurred vision, no chest pain, no fatigue, no polydipsia, no polyphagia and no polyuria. There are no hypoglycemic complications. Symptoms are stable. There are no diabetic complications. Risk factors for coronary artery disease include male sex and sedentary lifestyle. Current diabetic treatment includes oral agent (monotherapy). He is compliant with treatment all of the time. His weight is stable. He is following a diabetic diet. When asked about meal planning, he reported none.  He has not had a previous visit with a dietitian. He participates in exercise intermittently. (Blood sugar was 115 this am. ) An ACE inhibitor/angiotensin II receptor blocker is being taken. He does not see a podiatrist.Eye exam is current (scheduled for tomorrow).  Hypertension This is a chronic problem. The current episode started more than 1 year ago. The problem is unchanged. The problem is controlled. Pertinent negatives include no anxiety, blurred vision, chest pain, headaches or palpitations. There are no associated agents to hypertension. Risk factors for coronary artery disease include sedentary lifestyle and male gender. Past treatments include angiotensin blockers and diuretics. The current treatment provides no improvement. There are no compliance problems.  There is no history of angina. There is no history of chronic renal disease.    Past Medical History:  Diagnosis Date   Back pain    intermittent-prior military injury   BPH (benign prostatic hypertrophy) with urinary retention    Diabetes mellitus without complication (HCC)    GERD (gastroesophageal reflux disease)    High cholesterol    Hypertension      Family History  Problem Relation Age of Onset   Stroke Mother    Stroke Father      Current Outpatient Medications:    acetaminophen (TYLENOL) 500 MG tablet, Take 1 tablet (500 mg total) by mouth every 6 (six) hours as needed., Disp: 30 tablet, Rfl: 0   aspirin EC 81 MG tablet, Take 81 mg by mouth daily. Swallow whole., Disp: , Rfl:    Cholecalciferol (VITAMIN D) 50 MCG (2000 UT)  tablet, Take 2,000 Units by mouth daily. , Disp: , Rfl:    glucose blood (ONETOUCH ULTRA) test strip, Use as instructed, Disp: 100 each, Rfl: 2   glucose blood test strip, Use as instructed to test blood sugars three times a day DX:E11.65, Disp: 100 each, Rfl: 12   JARDIANCE 10 MG TABS tablet, TAKE 1 TABLET(10 MG) BY MOUTH DAILY BEFORE AND BREAKFAST, Disp: 30 tablet, Rfl: 5   meloxicam (MOBIC)  7.5 MG tablet, TAKE 1 TABLET(7.5 MG) BY MOUTH DAILY, Disp: 30 tablet, Rfl: 2   metFORMIN (GLUCOPHAGE) 1000 MG tablet, TAKE 1 TABLET(1000 MG) BY MOUTH TWICE DAILY WITH A MEAL, Disp: 180 tablet, Rfl: 1   mometasone (NASONEX) 50 MCG/ACT nasal spray, Place 2 sprays into the nose daily., Disp: 17 g, Rfl: 2   Multiple Vitamin (MULTIVITAMIN WITH MINERALS) TABS tablet, Take 1 tablet by mouth daily. One a Day Men's, Disp: , Rfl:    Olmesartan-amLODIPine-HCTZ 40-10-12.5 MG TABS, TAKE 1 TABLET BY MOUTH DAILY, Disp: 90 tablet, Rfl: 0   simvastatin (ZOCOR) 40 MG tablet, TAKE 1 TABLET(40 MG) BY MOUTH EVERY EVENING, Disp: 90 tablet, Rfl: 0   tamsulosin (FLOMAX) 0.4 MG CAPS capsule, Take 0.4 mg by mouth daily., Disp: , Rfl: 5   valACYclovir (VALTREX) 500 MG tablet, Take 500 mg by mouth daily., Disp: , Rfl:    traMADol (ULTRAM) 50 MG tablet, Take 1 tablet (50 mg total) by mouth every 12 (twelve) hours as needed. (Patient not taking: No sig reported), Disp: 30 tablet, Rfl: 0   Allergies  Allergen Reactions   Penicillins Rash    Has patient had a PCN reaction causing immediate rash, facial/tongue/throat swelling, SOB or lightheadedness with hypotension: No Has patient had a PCN reaction causing severe rash involving mucus membranes or skin necrosis: No Has patient had a PCN reaction that required hospitalization No Has patient had a PCN reaction occurring within the last 10 years: No If all of the above answers are "NO", then may proceed with Cephalosporin use.      Review of Systems  Constitutional:  Negative for fatigue.  Eyes:  Negative for blurred vision.  Respiratory: Negative.    Cardiovascular:  Negative for chest pain and palpitations.  Endocrine: Negative for polydipsia, polyphagia and polyuria.  Musculoskeletal:        Continues to have hip pain and leg pain   Neurological:  Negative for dizziness and headaches.  Psychiatric/Behavioral: Negative.      Today's Vitals   11/16/20 1011  BP:  124/82  Pulse: 85  Temp: 98.5 F (36.9 C)  Weight: 160 lb 9.6 oz (72.8 kg)  Height: 5' 9" (1.753 m)  PainSc: 5   PainLoc: Hip   Body mass index is 23.72 kg/m.   Objective:  Physical Exam Vitals reviewed.  Constitutional:      General: He is not in acute distress.    Appearance: Normal appearance.  Cardiovascular:     Rate and Rhythm: Normal rate and regular rhythm.     Pulses: Normal pulses.     Heart sounds: Normal heart sounds. No murmur heard. Pulmonary:     Effort: Pulmonary effort is normal. No respiratory distress.     Breath sounds: Normal breath sounds. No wheezing.  Musculoskeletal:        General: No swelling. Normal range of motion.     Right lower leg: No edema.     Left lower leg: No edema.  Skin:    General: Skin is warm and  dry.     Capillary Refill: Capillary refill takes less than 2 seconds.     Coloration: Skin is not jaundiced.  Neurological:     General: No focal deficit present.     Mental Status: He is alert and oriented to person, place, and time.     Cranial Nerves: No cranial nerve deficit.     Motor: No weakness.  Psychiatric:        Mood and Affect: Mood normal.        Behavior: Behavior normal.        Thought Content: Thought content normal.        Judgment: Judgment normal.        Assessment And Plan:     1. Type 2 diabetes mellitus without complication, without long-term current use of insulin (HCC) Comments: HgbA1c levels are improving, continue current medications - CMP14+EGFR - Hemoglobin A1c  2. Essential hypertension Comments: Blood pressure is fairly controlled.  Continue current medications  3. Mixed hyperlipidemia Comments: Stable, continue current medications - Lipid panel  4. Hip pain Comments: Continues to have hip pain which affects him walking at times.  - Ambulatory referral to Pain Clinic - ketorolac (TORADOL) injection 60 mg  5. Spinal stenosis of lumbar region without neurogenic claudication Comments: He  is not interested in surgery will refer to pain management for chronic pain - Ambulatory referral to Pain Clinic - ketorolac (TORADOL) injection 60 mg  6. Immunization due - Zoster Vaccine Adjuvanted [facility]) injection; Inject 0.5 mLs into the muscle once for 1 dose.  Dispense: 0.5 mL; Refill: 0    Patient was given opportunity to ask questions. Patient verbalized understanding of the plan and was able to repeat key elements of the plan. All questions were answered to their satisfaction.  [name], FNP   I, [name], FNP, have reviewed all documentation for this visit. The documentation on 11/16/20 for the exam, diagnosis, procedures, and orders are all accurate and complete.   IF YOU HAVE BEEN REFERRED TO A SPECIALIST, IT MAY TAKE 1-2 WEEKS TO SCHEDULE/PROCESS THE REFERRAL. IF YOU HAVE NOT HEARD FROM US/SPECIALIST IN TWO WEEKS, PLEASE GIVE [place] A CALL AT [phone].   THE PATIENT IS ENCOURAGED TO PRACTICE SOCIAL DISTANCING DUE TO THE COVID-19 PANDEMIC.

## 2020-11-17 LAB — CMP14+EGFR
ALT: 17 IU/L (ref 0–44)
AST: 20 IU/L (ref 0–40)
Albumin/Globulin Ratio: 1.9 (ref 1.2–2.2)
Albumin: 4.7 g/dL (ref 3.7–4.7)
Alkaline Phosphatase: 94 IU/L (ref 44–121)
BUN/Creatinine Ratio: 12 (ref 10–24)
BUN: 13 mg/dL (ref 8–27)
Bilirubin Total: 0.6 mg/dL (ref 0.0–1.2)
CO2: 22 mmol/L (ref 20–29)
Calcium: 9.7 mg/dL (ref 8.6–10.2)
Chloride: 100 mmol/L (ref 96–106)
Creatinine, Ser: 1.11 mg/dL (ref 0.76–1.27)
Globulin, Total: 2.5 g/dL (ref 1.5–4.5)
Glucose: 135 mg/dL — ABNORMAL HIGH (ref 70–99)
Potassium: 4.1 mmol/L (ref 3.5–5.2)
Sodium: 141 mmol/L (ref 134–144)
Total Protein: 7.2 g/dL (ref 6.0–8.5)
eGFR: 70 mL/min/{1.73_m2} (ref >59)

## 2020-11-17 LAB — LIPID PANEL
Chol/HDL Ratio: 2.6 ratio (ref 0.0–5.0)
Cholesterol, Total: 137 mg/dL (ref 100–199)
HDL: 53 mg/dL (ref >39)
LDL Chol Calc (NIH): 71 mg/dL (ref 0–99)
Triglycerides: 59 mg/dL (ref 0–149)
VLDL Cholesterol Cal: 13 mg/dL (ref 5–40)

## 2020-11-17 LAB — HEMOGLOBIN A1C
Est. average glucose Bld gHb Est-mCnc: 148 mg/dL
Hgb A1c MFr Bld: 6.8 % — ABNORMAL HIGH (ref 4.8–5.6)

## 2020-11-20 DIAGNOSIS — N401 Enlarged prostate with lower urinary tract symptoms: Secondary | ICD-10-CM | POA: Diagnosis not present

## 2020-11-27 DIAGNOSIS — R972 Elevated prostate specific antigen [PSA]: Secondary | ICD-10-CM | POA: Diagnosis not present

## 2020-11-27 DIAGNOSIS — R3914 Feeling of incomplete bladder emptying: Secondary | ICD-10-CM | POA: Diagnosis not present

## 2020-11-27 DIAGNOSIS — N401 Enlarged prostate with lower urinary tract symptoms: Secondary | ICD-10-CM | POA: Diagnosis not present

## 2020-12-09 ENCOUNTER — Other Ambulatory Visit: Payer: Self-pay | Admitting: Nurse Practitioner

## 2020-12-13 ENCOUNTER — Other Ambulatory Visit: Payer: Self-pay | Admitting: Nurse Practitioner

## 2020-12-13 DIAGNOSIS — E119 Type 2 diabetes mellitus without complications: Secondary | ICD-10-CM

## 2020-12-25 ENCOUNTER — Telehealth: Payer: Self-pay

## 2020-12-25 NOTE — Chronic Care Management (AMB) (Signed)
  Ricky Shields [name] was reminded to have all medications, supplements and any blood glucose and blood pressure readings available for review with [name], Pharm. D, at his telephone visit on 12-26-2020 at 3:00.  Questions: Have you had any recent office visit or specialist visit outside of [name]? Patient stated no  Are there any concerns you would like to discuss during your office visit? Patient stated sciatic nerve has been bothering him causing pain in hips and legs.  Are you having any problems obtaining your medications? (Whether it pharmacy issues or cost) Patient stated no  If patient has any PAP medications ask if they are having any problems getting their PAP medication or refill? No PAP medications  Care Gaps: Shingrix overdue Yearly foot exam overdue AWV 08-09-2021  Star Rating Drug: Simvastatin 40 mg- Last filled 10-22-2020 90 DS [name] Olmesartan 40 mg- Last filled 12-11-2020 90 DS [name] Jardiance 10 mg- Last filled 12-11-2020 30 DS [name] Metformin 1000 mg- Last filled 12-13-2020 90 [name]  Any gaps in medications fill history? No  [name] Pharmacist Assistant [phone]

## 2020-12-26 ENCOUNTER — Ambulatory Visit (INDEPENDENT_AMBULATORY_CARE_PROVIDER_SITE_OTHER): Payer: PPO

## 2020-12-26 DIAGNOSIS — G8929 Other chronic pain: Secondary | ICD-10-CM

## 2020-12-26 DIAGNOSIS — E119 Type 2 diabetes mellitus without complications: Secondary | ICD-10-CM

## 2020-12-26 DIAGNOSIS — E782 Mixed hyperlipidemia: Secondary | ICD-10-CM

## 2020-12-26 MED ORDER — ATORVASTATIN CALCIUM 20 MG PO TABS: 20.0000 mg | ORAL_TABLET | Freq: Every day | ORAL | 3 refills | Status: DC

## 2020-12-26 NOTE — Patient Instructions (Addendum)
Visit Information It was great speaking with you today!  Please let me know if you have any questions about our visit.   Goals Addressed             This Visit's Progress    Monitor and Manage My Blood Sugar-Diabetes Type 2       Timeframe:  Long-Range Goal Priority:  High Start Date:                             Expected End Date:                       Follow Up Date 07/03/2021  In Progress:   - check blood sugar at prescribed times - check blood sugar if I feel it is too high or too low - take the blood sugar log to all doctor visits    Why is this important?   Checking your blood sugar at home helps to keep it from getting very high or very low.  Writing the results in a diary or log helps the doctor know how to care for you.  Your blood sugar log should have the time, date and the results.  Also, write down the amount of insulin or other medicine that you take.  Other information, like what you ate, exercise done and how you were feeling, will also be helpful.             Patient Care Plan: CCM Pharmacy Care Plan     Problem Identified: DM, HLD, Chronic Pain   Priority: High     Long-Range Goal: Disease Management   Start Date: 02/24/2020  Recent Progress: On track  Priority: High  Note:    Current Barriers:  Unable to independently monitor therapeutic efficacy  Pharmacist Clinical Goal(s):  Patient will achieve adherence to monitoring guidelines and medication adherence to achieve therapeutic efficacy through collaboration with PharmD and provider.   Interventions: 1:1 collaboration with [name], FNP regarding development and update of comprehensive plan of care as evidenced by provider attestation and co-signature Inter-disciplinary care team collaboration (see longitudinal plan of care) Comprehensive medication review performed; medication list updated in electronic medical record  Diabetes (A1c goal <7%) -Controlled -Current  medications: Jardiance 10 mg tablet once per day  Metformin 1000 mg taking 1 tablet by mouth twice per day -Current home glucose readings fasting glucose: 120 -Denies hypoglycemic/hyperglycemic symptoms -Current meal patterns: patient reports that he is not a big eater, he does not eat a lot of pork  breakfast: he eats every morning, it varies   dinner: fish, baked potato, or sometimes cereal and a banana drinks: he is drinking plenty of water  -Current exercise: please see hyperlipidemia -Educated on A1c and blood sugar goals; Carbohydrate counting and/or plate method -Patient had questions about his medication regimen and how well the medication is working. -Patient is thankful that he only has to take two pills of Metformin per day  -Counseled to check feet daily and get yearly eye exams -Recommended to continue current medication   Hyperlipidemia: (LDL goal < 70) -Controlled -Current treatment: Simvastatin 40 mg tablet once per day  -Current dietary patterns: patient reports limiting fried and fatty foods.  -Current exercise habits: the last time he was able to really exercise was three weeks ago. He also walks on his treadmill and does exercise on a stationary bike at home for at least 30 minutes  per day. -Educated on Cholesterol goals;  Benefits of statin for ASCVD risk reduction; -Collaborated with PCP team to change patients medication to Atorvastatin 20 mg tablet once per day.    Chronic Pain (Goal: reduce pain) -Not ideally controlled -Current treatment  Meloxicam 7.5 mg tablet once per day  Patient reports that his pain is not under control  -Current pain scale: 7-10/10 -Patient reports that Dr. [name] wants to do surgery on him  to help his spine.  -He spoke with PCP and wanted a referral sent to another doctor. -Recommended patient contact the specialist, or let  -He is unable to go to the gym because of the pain  -Recommended to continue current  medication  Patient Goals/Self-Care Activities Patient will:  - take medications as prescribed as evidenced by patient report and record review  Follow Up Plan: The patient has been provided with contact information for the care management team and has been advised to call with any health related questions or concerns.       Patient agreed to services and verbal consent obtained.   The patient verbalized understanding of instructions, educational materials, and care plan provided today and agreed to receive a mailed copy of patient instructions, educational materials, and care plan.   [name], PharmD Clinical Pharmacist [facility] [phone]

## 2020-12-26 NOTE — Progress Notes (Addendum)
Chronic Care Management Pharmacy Note  12/26/2020 Name:  Ricky Shields MRN:  [id number] DOB:  April 19, 1945  Summary: Patient reports that he is doing well.   Recommendations/Changes made from today's visit: Recommend patients simvastatin 40 mg be changed to Atorvastatin 20 mg once per day.   Plan: Patient to stop taking simvastatin and start taking atorvastatin 20 mg tablet once per day. Patient reports that he is going to start the Atorvastatin 20 mg tablet today. Appointment to see Dr. Primus Bravo is 01/19/2021 at 3:00pm.    Subjective: Ricky Shields is an 75 y.o. year old male who is a primary patient of [name], [place].  The CCM team was consulted for assistance with disease management and care coordination needs.    Engaged with patient by telephone for follow up visit in response to provider referral for pharmacy case management and/or care coordination services.   Consent to Services:  The patient was given information about Chronic Care Management services, agreed to services, and gave verbal consent prior to initiation of services.  Please see initial visit note for detailed documentation.   Patient Care Team: [name], FNP as PCP - General (General Practice) Mayford Knife, [facility] (Pharmacist)  Recent office visits: 11/16/2020 PCP OV  Recent consult visits: 09/05/2020 [facility] visits: None in previous 6 months    Objective:  Lab Results  Component Value Date   CREATININE 1.11 11/16/2020   BUN 13 11/16/2020   GFRNONAA 60 01/10/2020   GFRAA 70 01/10/2020   NA 141 11/16/2020   K 4.1 11/16/2020   CALCIUM 9.7 11/16/2020   CO2 22 11/16/2020   GLUCOSE 135 (H) 11/16/2020    Lab Results  Component Value Date/Time   HGBA1C 6.8 (H) 11/16/2020 11:08 AM   HGBA1C 6.8 (H) 07/12/2020 10:39 AM   MICROALBUR 30 07/08/2019 12:51 PM   MICROALBUR 80 06/25/2018 12:39 PM    Last diabetic Eye exam:  Lab Results  Component Value Date/Time   HMDIABEYEEXA  No Retinopathy 12/30/2019 12:00 AM    Last diabetic Foot exam: No results found for: HMDIABFOOTEX   Lab Results  Component Value Date   CHOL 137 11/16/2020   HDL 53 11/16/2020   LDLCALC 71 11/16/2020   TRIG 59 11/16/2020   CHOLHDL 2.6 11/16/2020    Hepatic Function Latest Ref Rng & Units 11/16/2020 07/12/2020 10/14/2019  Total Protein 6.0 - 8.5 g/dL 7.2 7.5 7.2  Albumin 3.7 - 4.7 g/dL 4.7 5.0(H) 4.6  AST 0 - 40 IU/L '20 20 19  ' ALT 0 - 44 IU/L '17 14 19  ' Alk Phosphatase 44 - 121 IU/L 94 73 71  Total Bilirubin 0.0 - 1.2 mg/dL 0.6 0.7 0.6    No results found for: TSH, FREET4  CBC Latest Ref Rng & Units 07/12/2020 07/08/2019 06/25/2018  WBC 3.4 - 10.8 x10E3/uL 5.9 6.4 5.5  Hemoglobin 13.0 - 17.7 g/dL 14.9 14.3 13.9  Hematocrit 37.5 - 51.0 % 43.2 39.8 37.6  Platelets 150 - 450 x10E3/uL 262 250 229    No results found for: VD25OH  Clinical ASCVD: No  The 10-year ASCVD risk score (Arnett DK, et al., 2019) is: 20.3%   Values used to calculate the score:     Age: 75 years     Sex: Male     Is Non-Hispanic African American: Yes     Diabetic: Yes     Tobacco smoker: No     Systolic Blood Pressure: 103 mmHg  Is BP treated: No     HDL Cholesterol: 53 mg/dL     Total Cholesterol: 137 mg/dL    Depression screen [facility] 2/9 07/13/2020 07/12/2020 07/08/2019  Decreased Interest 0 0 0  Down, Depressed, Hopeless 0 0 0  PHQ - 2 Score 0 0 0  Altered sleeping - 0 0  Tired, decreased energy - 0 0  Change in appetite - 0 0  Feeling bad or failure about yourself  - 0 0  Trouble concentrating - 0 0  Moving slowly or fidgety/restless - 0 0  Suicidal thoughts - 0 0  PHQ-9 Score - 0 0  Difficult doing work/chores - - Not difficult at all      Social History   Tobacco Use  Smoking Status Never  Smokeless Tobacco Never   BP Readings from Last 3 Encounters:  11/16/20 124/82  09/05/20 129/65  07/12/20 130/76   Pulse Readings from Last 3 Encounters:  11/16/20 85  07/12/20 96  04/10/20 88    Wt Readings from Last 3 Encounters:  11/16/20 160 lb 9.6 oz (72.8 kg)  09/05/20 160 lb (72.6 kg)  07/13/20 155 lb (70.3 kg)   BMI Readings from Last 3 Encounters:  11/16/20 23.72 kg/m  09/05/20 23.63 kg/m  07/13/20 22.89 kg/m    Assessment/Interventions: Review of patient past medical history, allergies, medications, health status, including review of consultants reports, laboratory and other test data, was performed as part of comprehensive evaluation and provision of chronic care management services.   SDOH:  (Social Determinants of Health) assessments and interventions performed: No  SDOH Screenings   Alcohol Screen: Not on file  Depression (PHQ2-9): Low Risk    PHQ-2 Score: 0  Financial Resource Strain: Low Risk    Difficulty of Paying Living Expenses: Not hard at all  Food Insecurity: No Food Insecurity   Worried About Charity fundraiser in the Last Year: Never true   Ran Out of Food in the Last Year: Never true  Housing: Low Risk    Last Housing Risk Score: 0  Physical Activity: Sufficiently Active   Days of Exercise per Week: 3 days   Minutes of Exercise per Session: 90 min  Social Connections: Not on file  Stress: No Stress Concern Present   Feeling of Stress : Not at all  Tobacco Use: Low Risk    Smoking Tobacco Use: Never   Smokeless Tobacco Use: Never   Passive Exposure: Not on file  Transportation Needs: No Transportation Needs   Lack of Transportation (Medical): No   Lack of Transportation (Non-Medical): No    CCM Care Plan  Allergies  Allergen Reactions   Penicillins Rash    Has patient had a PCN reaction causing immediate rash, facial/tongue/throat swelling, SOB or lightheadedness with hypotension: No Has patient had a PCN reaction causing severe rash involving mucus membranes or skin necrosis: No Has patient had a PCN reaction that required hospitalization No Has patient had a PCN reaction occurring within the last 10 years: No If all of the  above answers are "NO", then may proceed with Cephalosporin use.     Medications Reviewed Today     Reviewed by Mayford Knife, RPH (Pharmacist) on 12/26/20 at 1532  Med List Status: <None>   Medication Order Taking? Sig Documenting Provider Last Dose Status Informant  acetaminophen (TYLENOL) 500 MG tablet [id number] Yes Take 1 tablet (500 mg total) by mouth every 6 (six) hours as needed. [name], FNP Taking Active  aspirin EC 81 MG tablet [id number] Yes Take 81 mg by mouth daily. Swallow whole. [provider] Taking Active   Cholecalciferol (VITAMIN D) 50 MCG (2000 UT) tablet [id number] Yes Take 2,000 Units by mouth daily.  [provider] Taking Active Self  glucose blood (ONETOUCH ULTRA) test strip [id number] Yes Use as instructed [name], FNP Taking Active   glucose blood test strip [id number] Yes Use as instructed to test blood sugars three times a day DX:E11.65 [name], FNP Taking Active   JARDIANCE 10 MG TABS tablet [id number] Yes TAKE 1 TABLET(10 MG) BY MOUTH DAILY BEFORE AND Evelena Peat, FNP Taking Active   meloxicam (MOBIC) 7.5 MG tablet [id number] Yes TAKE 1 TABLET(7.5 MG) BY MOUTH DAILY [name], FNP Taking Active   metFORMIN (GLUCOPHAGE) 1000 MG tablet [id number] Yes TAKE 1 TABLET(1000 MG) BY MOUTH TWICE DAILY WITH A MEAL [name], FNP  Active   mometasone (NASONEX) 50 MCG/ACT nasal spray [id number]  Place 2 sprays into the nose daily. [name], FNP  Expired 11/16/20 2359   Multiple Vitamin (MULTIVITAMIN WITH MINERALS) TABS tablet [id number]  Take 1 tablet by mouth daily. One a Day Men's [provider]  Active Self  Olmesartan-amLODIPine-HCTZ 40-10-12.5 MG TABS [id number]  TAKE 1 TABLET BY MOUTH DAILY [name], FNP  Active   simvastatin (ZOCOR) 40 MG tablet [id number]  TAKE 1 TABLET(40 MG) BY MOUTH EVERY Fredric Mare, MD  Active   tamsulosin (FLOMAX) 0.4 MG CAPS capsule [id number]  Take 0.4 mg by mouth  daily. [provider]  Active            Med Note Valma Cava   UGQ Jan 27, 2016 11:57 AM)    traMADol (ULTRAM) 50 MG tablet [id number]  Take 1 tablet (50 mg total) by mouth every 12 (twelve) hours as needed.  Patient not taking: No sig reported   Marybelle Killings, MD  Active   valACYclovir (VALTREX) 500 MG tablet [id number]  Take 500 mg by mouth daily. [provider]  Active             Patient Active Problem List   Diagnosis Date Noted   Spinal stenosis of lumbar region 02/17/2020   Tingling of skin 03/20/2018   Type 2 diabetes mellitus without complication, without long-term current use of insulin ([place]) 12/19/2017   Essential hypertension 12/19/2017    Immunization History  Administered Date(s) Administered   Fluad Quad(high Dose 65+) 09/30/2018, 10/19/2019   Influenza, High Dose Seasonal PF 10/10/2017   Influenza-Unspecified 11/10/2017, 10/26/2020   PFIZER(Purple Top)SARS-COV-2 Vaccination 02/19/2019, 03/12/2019, 11/13/2019, 12/14/2019, 10/26/2020   Pneumococcal Conjugate-13 09/30/2018   Pneumococcal Polysaccharide-23 10/17/2015   Pneumococcal-Unspecified 11/02/2012, 02/09/2014   Tdap 11/02/2012    Conditions to be addressed/monitored:  Hyperlipidemia, Diabetes, and Chronic Pain  Care Plan : [place]  Updates made by Mayford Knife, [place] since 12/26/2020 12:00 AM     Problem: DM, HLD, Chronic Pain   Priority: High     Long-Range Goal: Disease Management   Start Date: 02/24/2020  Recent Progress: On track  Priority: High  Note:    Current Barriers:  Unable to independently monitor therapeutic efficacy  Pharmacist Clinical Goal(s):  Patient will achieve adherence to monitoring guidelines and medication adherence to achieve therapeutic efficacy through collaboration with PharmD and provider.   Interventions: 1:1 collaboration with [name], FNP regarding development and update of comprehensive plan of care as  evidenced by provider attestation and co-signature  Inter-disciplinary care team collaboration (see longitudinal plan of care) Comprehensive medication review performed; medication list updated in electronic medical record  Diabetes (A1c goal <7%) -Controlled -Current medications: Jardiance 10 mg tablet once per day  Metformin 1000 mg taking 1 tablet by mouth twice per day -Current home glucose readings fasting glucose: 120 -Denies hypoglycemic/hyperglycemic symptoms -Current meal patterns: patient reports that he is not a big eater, he does not eat a lot of pork  breakfast: he eats every morning, it varies   dinner: fish, baked potato, or sometimes cereal and a banana drinks: he is drinking plenty of water  -Current exercise: please see hyperlipidemia -Educated on A1c and blood sugar goals; Carbohydrate counting and/or plate method -Patient had questions about his medication regimen and how well the medication is working. -Patient is thankful that he only has to take two pills of Metformin per day  -Counseled to check feet daily and get yearly eye exams -Recommended to continue current medication   Hyperlipidemia: (LDL goal < 70) -Controlled -Current treatment: Simvastatin 40 mg tablet once per day  -Current dietary patterns: patient reports limiting fried and fatty foods.  -Current exercise habits: the last time he was able to really exercise was three weeks ago. He also walks on his treadmill and does exercise on a stationary bike at home for at least 30 minutes per day. -Educated on Cholesterol goals;  Benefits of statin for ASCVD risk reduction; -Collaborated with PCP team to change patients medication to Atorvastatin 20 mg tablet once per day.    Chronic Pain (Goal: reduce pain) -Not ideally controlled -Current treatment  Meloxicam 7.5 mg tablet once per day  Patient reports that his pain is not under control  -Current pain scale: 7-10/10 -Patient reports that Dr. [name]  wants to do surgery on him  to help his spine.  -He spoke with PCP and wanted a referral sent to another doctor. -Recommended patient contact the specialist, or let  -He is unable to go to the gym because of the pain  -Recommended to continue current medication  Patient Goals/Self-Care Activities Patient will:  - take medications as prescribed as evidenced by patient report and record review  Follow Up Plan: The patient has been provided with contact information for the care management team and has been advised to call with any health related questions or concerns.       Medication Assistance: None required.  Patient affirms current coverage meets needs.  Compliance/Adherence/Medication fill history: Care Gaps: Foot Exam  Shingles Vaccine - took first one 11/22/2020, scheduled for the next one after 01/17/2021  Star-Rating Drugs: Jardiance 10 mg tablet Metformin 1000 mg tablet Olmesartan-Amlodipine-HCTZ 40-10-12.5 mg tablet once per day Simvastatin 40 mg tablet   Patient's preferred pharmacy is:  [facility] [place], [address] AT [address] [place] [address] Phone: [phone] Fax: [phone]  [facility] [phone] [place], [place] [address] Phone: [phone] Fax: [phone]  Uses pill box? Yes Pt endorses 95% compliance  We discussed: Benefits of medication synchronization, packaging and delivery as well as enhanced pharmacist oversight with [employer]. Patient decided to: Continue current medication management strategy  Care Plan and Follow Up Patient Decision:  Patient agrees to Care Plan and Follow-up.  Plan: The patient has been provided with contact information for the care management team and has been advised to call  with any health related questions or concerns.   [name], CPP, PharmD Clinical Pharmacist  Practitioner [facility] [phone]

## 2020-12-26 NOTE — Progress Notes (Incomplete Revision)
Chronic Care Management Pharmacy Note  12/26/2020 Name:  Ricky Shields MRN:  [id number] DOB:  01-Feb-1945  Summary: Patient reports that he is doing well.   Recommendations/Changes made from today's visit: Recommend patients simvastatin 40 mg be changed to Atorvastatin 20 mg once per day.   Plan: Patient to stop taking simvastatin and start taking atorvastatin 20 mg tablet once per day. Patient reports that he is going to start the Atorvastatin 20 mg tablet today. Appointment to see Dr. Primus Bravo is 01/19/2021 at 3:00pm.    Subjective: Ricky Shields is an 75 y.o. year old male who is a primary patient of [name], [place].  The CCM team was consulted for assistance with disease management and care coordination needs.    Engaged with patient by telephone for follow up visit in response to provider referral for pharmacy case management and/or care coordination services.   Consent to Services:  The patient was given information about Chronic Care Management services, agreed to services, and gave verbal consent prior to initiation of services.  Please see initial visit note for detailed documentation.   Patient Care Team: [name], FNP as PCP - General (General Practice) Mayford Knife, [facility] (Pharmacist)  Recent office visits: 11/16/2020 PCP OV  Recent consult visits: 09/05/2020 [facility] visits: None in previous 6 months    Objective:  Lab Results  Component Value Date   CREATININE 1.11 11/16/2020   BUN 13 11/16/2020   GFRNONAA 60 01/10/2020   GFRAA 70 01/10/2020   NA 141 11/16/2020   K 4.1 11/16/2020   CALCIUM 9.7 11/16/2020   CO2 22 11/16/2020   GLUCOSE 135 (H) 11/16/2020    Lab Results  Component Value Date/Time   HGBA1C 6.8 (H) 11/16/2020 11:08 AM   HGBA1C 6.8 (H) 07/12/2020 10:39 AM   MICROALBUR 30 07/08/2019 12:51 PM   MICROALBUR 80 06/25/2018 12:39 PM    Last diabetic Eye exam:  Lab Results  Component Value Date/Time   HMDIABEYEEXA  No Retinopathy 12/30/2019 12:00 AM    Last diabetic Foot exam: No results found for: HMDIABFOOTEX   Lab Results  Component Value Date   CHOL 137 11/16/2020   HDL 53 11/16/2020   LDLCALC 71 11/16/2020   TRIG 59 11/16/2020   CHOLHDL 2.6 11/16/2020    Hepatic Function Latest Ref Rng & Units 11/16/2020 07/12/2020 10/14/2019  Total Protein 6.0 - 8.5 g/dL 7.2 7.5 7.2  Albumin 3.7 - 4.7 g/dL 4.7 5.0(H) 4.6  AST 0 - 40 IU/L '20 20 19  ' ALT 0 - 44 IU/L '17 14 19  ' Alk Phosphatase 44 - 121 IU/L 94 73 71  Total Bilirubin 0.0 - 1.2 mg/dL 0.6 0.7 0.6    No results found for: TSH, FREET4  CBC Latest Ref Rng & Units 07/12/2020 07/08/2019 06/25/2018  WBC 3.4 - 10.8 x10E3/uL 5.9 6.4 5.5  Hemoglobin 13.0 - 17.7 g/dL 14.9 14.3 13.9  Hematocrit 37.5 - 51.0 % 43.2 39.8 37.6  Platelets 150 - 450 x10E3/uL 262 250 229    No results found for: VD25OH  Clinical ASCVD: No  The 10-year ASCVD risk score (Arnett DK, et al., 2019) is: 20.3%   Values used to calculate the score:     Age: 75 years     Sex: Male     Is Non-Hispanic African American: Yes     Diabetic: Yes     Tobacco smoker: No     Systolic Blood Pressure: 622 mmHg  Is BP treated: No     HDL Cholesterol: 53 mg/dL     Total Cholesterol: 137 mg/dL    Depression screen [facility] 2/9 07/13/2020 07/12/2020 07/08/2019  Decreased Interest 0 0 0  Down, Depressed, Hopeless 0 0 0  PHQ - 2 Score 0 0 0  Altered sleeping - 0 0  Tired, decreased energy - 0 0  Change in appetite - 0 0  Feeling bad or failure about yourself  - 0 0  Trouble concentrating - 0 0  Moving slowly or fidgety/restless - 0 0  Suicidal thoughts - 0 0  PHQ-9 Score - 0 0  Difficult doing work/chores - - Not difficult at all      Social History   Tobacco Use  Smoking Status Never  Smokeless Tobacco Never   BP Readings from Last 3 Encounters:  11/16/20 124/82  09/05/20 129/65  07/12/20 130/76   Pulse Readings from Last 3 Encounters:  11/16/20 85  07/12/20 96  04/10/20 88    Wt Readings from Last 3 Encounters:  11/16/20 160 lb 9.6 oz (72.8 kg)  09/05/20 160 lb (72.6 kg)  07/13/20 155 lb (70.3 kg)   BMI Readings from Last 3 Encounters:  11/16/20 23.72 kg/m  09/05/20 23.63 kg/m  07/13/20 22.89 kg/m    Assessment/Interventions: Review of patient past medical history, allergies, medications, health status, including review of consultants reports, laboratory and other test data, was performed as part of comprehensive evaluation and provision of chronic care management services.   SDOH:  (Social Determinants of Health) assessments and interventions performed: No  SDOH Screenings   Alcohol Screen: Not on file  Depression (PHQ2-9): Low Risk    PHQ-2 Score: 0  Financial Resource Strain: Low Risk    Difficulty of Paying Living Expenses: Not hard at all  Food Insecurity: No Food Insecurity   Worried About Charity fundraiser in the Last Year: Never true   Ran Out of Food in the Last Year: Never true  Housing: Low Risk    Last Housing Risk Score: 0  Physical Activity: Sufficiently Active   Days of Exercise per Week: 3 days   Minutes of Exercise per Session: 90 min  Social Connections: Not on file  Stress: No Stress Concern Present   Feeling of Stress : Not at all  Tobacco Use: Low Risk    Smoking Tobacco Use: Never   Smokeless Tobacco Use: Never   Passive Exposure: Not on file  Transportation Needs: No Transportation Needs   Lack of Transportation (Medical): No   Lack of Transportation (Non-Medical): No    CCM Care Plan  Allergies  Allergen Reactions   Penicillins Rash    Has patient had a PCN reaction causing immediate rash, facial/tongue/throat swelling, SOB or lightheadedness with hypotension: No Has patient had a PCN reaction causing severe rash involving mucus membranes or skin necrosis: No Has patient had a PCN reaction that required hospitalization No Has patient had a PCN reaction occurring within the last 10 years: No If all of the  above answers are "NO", then may proceed with Cephalosporin use.     Medications Reviewed Today     Reviewed by Mayford Knife, RPH (Pharmacist) on 12/26/20 at 1532  Med List Status: <None>   Medication Order Taking? Sig Documenting Provider Last Dose Status Informant  acetaminophen (TYLENOL) 500 MG tablet [id number] Yes Take 1 tablet (500 mg total) by mouth every 6 (six) hours as needed. [name], FNP Taking Active  aspirin EC 81 MG tablet [id number] Yes Take 81 mg by mouth daily. Swallow whole. [provider] Taking Active   Cholecalciferol (VITAMIN D) 50 MCG (2000 UT) tablet [id number] Yes Take 2,000 Units by mouth daily.  [provider] Taking Active Self  glucose blood (ONETOUCH ULTRA) test strip [id number] Yes Use as instructed [name], FNP Taking Active   glucose blood test strip [id number] Yes Use as instructed to test blood sugars three times a day DX:E11.65 [name], FNP Taking Active   JARDIANCE 10 MG TABS tablet [id number] Yes TAKE 1 TABLET(10 MG) BY MOUTH DAILY BEFORE AND Evelena Peat, FNP Taking Active   meloxicam (MOBIC) 7.5 MG tablet [id number] Yes TAKE 1 TABLET(7.5 MG) BY MOUTH DAILY [name], FNP Taking Active   metFORMIN (GLUCOPHAGE) 1000 MG tablet [id number] Yes TAKE 1 TABLET(1000 MG) BY MOUTH TWICE DAILY WITH A MEAL [name], FNP  Active   mometasone (NASONEX) 50 MCG/ACT nasal spray [id number]  Place 2 sprays into the nose daily. [name], FNP  Expired 11/16/20 2359   Multiple Vitamin (MULTIVITAMIN WITH MINERALS) TABS tablet [id number]  Take 1 tablet by mouth daily. One a Day Men's [provider]  Active Self  Olmesartan-amLODIPine-HCTZ 40-10-12.5 MG TABS [id number]  TAKE 1 TABLET BY MOUTH DAILY [name], FNP  Active   simvastatin (ZOCOR) 40 MG tablet [id number]  TAKE 1 TABLET(40 MG) BY MOUTH EVERY Fredric Mare, MD  Active   tamsulosin (FLOMAX) 0.4 MG CAPS capsule [id number]  Take 0.4 mg by mouth  daily. [provider]  Active            Med Note Valma Cava   LHT Jan 27, 2016 11:57 AM)    traMADol (ULTRAM) 50 MG tablet [id number]  Take 1 tablet (50 mg total) by mouth every 12 (twelve) hours as needed.  Patient not taking: No sig reported   Marybelle Killings, MD  Active   valACYclovir (VALTREX) 500 MG tablet [id number]  Take 500 mg by mouth daily. [provider]  Active             Patient Active Problem List   Diagnosis Date Noted   Spinal stenosis of lumbar region 02/17/2020   Tingling of skin 03/20/2018   Type 2 diabetes mellitus without complication, without long-term current use of insulin ([place]) 12/19/2017   Essential hypertension 12/19/2017    Immunization History  Administered Date(s) Administered   Fluad Quad(high Dose 65+) 09/30/2018, 10/19/2019   Influenza, High Dose Seasonal PF 10/10/2017   Influenza-Unspecified 11/10/2017, 10/26/2020   PFIZER(Purple Top)SARS-COV-2 Vaccination 02/19/2019, 03/12/2019, 11/13/2019, 12/14/2019, 10/26/2020   Pneumococcal Conjugate-13 09/30/2018   Pneumococcal Polysaccharide-23 10/17/2015   Pneumococcal-Unspecified 11/02/2012, 02/09/2014   Tdap 11/02/2012    Conditions to be addressed/monitored:  Hyperlipidemia, Diabetes, and Chronic Pain  Care Plan : [place]  Updates made by Mayford Knife, [place] since 12/26/2020 12:00 AM     Problem: DM, HLD, Chronic Pain   Priority: High     Long-Range Goal: Disease Management   Start Date: 02/24/2020  Recent Progress: On track  Priority: High  Note:    Current Barriers:  Unable to independently monitor therapeutic efficacy  Pharmacist Clinical Goal(s):  Patient will achieve adherence to monitoring guidelines and medication adherence to achieve therapeutic efficacy through collaboration with PharmD and provider.   Interventions: 1:1 collaboration with [name], FNP regarding development and update of comprehensive plan of care as  evidenced by provider attestation and co-signature  Inter-disciplinary care team collaboration (see longitudinal plan of care) Comprehensive medication review performed; medication list updated in electronic medical record  Diabetes (A1c goal <7%) -Controlled -Current medications: Jardiance 10 mg tablet once per day  Metformin 1000 mg taking 1 tablet by mouth twice per day -Current home glucose readings fasting glucose: 120 -Denies hypoglycemic/hyperglycemic symptoms -Current meal patterns: patient reports that he is not a big eater, he does not eat a lot of pork  breakfast: he eats every morning, it varies   dinner: fish, baked potato, or sometimes cereal and a banana drinks: he is drinking plenty of water  -Current exercise: please see hyperlipidemia -Educated on A1c and blood sugar goals; Carbohydrate counting and/or plate method -Patient had questions about his medication regimen and how well the medication is working. -Patient is thankful that he only has to take two pills of Metformin per day  -Counseled to check feet daily and get yearly eye exams -Recommended to continue current medication   Hyperlipidemia: (LDL goal < 70) -Controlled -Current treatment: Simvastatin 40 mg tablet once per day  -Current dietary patterns: patient reports limiting fried and fatty foods.  -Current exercise habits: the last time he was able to really exercise was three weeks ago. He also walks on his treadmill and does exercise on a stationary bike at home for at least 30 minutes per day. -Educated on Cholesterol goals;  Benefits of statin for ASCVD risk reduction; -Collaborated with PCP team to change patients medication to Atorvastatin 20 mg tablet once per day.    Chronic Pain (Goal: reduce pain) -Not ideally controlled -Current treatment  Meloxicam 7.5 mg tablet once per day  Patient reports that his pain is not under control  -Current pain scale: 7-10/10 -Patient reports that Dr. [name]  wants to do surgery on him  to help his spine.  -He spoke with PCP and wanted a referral sent to another doctor. -Recommended patient contact the specialist, or let  -He is unable to go to the gym because of the pain  -Recommended to continue current medication  Patient Goals/Self-Care Activities Patient will:  - take medications as prescribed as evidenced by patient report and record review  Follow Up Plan: The patient has been provided with contact information for the care management team and has been advised to call with any health related questions or concerns.       Medication Assistance: None required.  Patient affirms current coverage meets needs.  Compliance/Adherence/Medication fill history: Care Gaps: Foot Exam  Shingles Vaccine - took first one 11/22/2020, scheduled for the next one after 01/17/2021  Star-Rating Drugs: Jardiance 10 mg tablet Metformin 1000 mg tablet Olmesartan-Amlodipine-HCTZ 40-10-12.5 mg tablet once per day Simvastatin 40 mg tablet   Patient's preferred pharmacy is:  [facility] STORE [place], [place] AT [address] [place] [address] Phone: [phone] Fax: [phone]  [facility] [phone] [place], [place] [address] Phone: [phone] Fax: [phone]  Uses pill box? Yes Pt endorses 95% compliance  We discussed: Benefits of medication synchronization, packaging and delivery as well as enhanced pharmacist oversight with [employer]. Patient decided to: Continue current medication management strategy  Care Plan and Follow Up Patient Decision:  Patient agrees to Care Plan and Follow-up.  Plan: The patient has been provided with contact information for the care management team and has been advised to call  with any health related questions or concerns.   [name], CPP, PharmD Clinical Pharmacist  Practitioner [facility] [phone]

## 2021-01-02 LAB — HM DIABETES EYE EXAM

## 2021-01-10 ENCOUNTER — Other Ambulatory Visit: Payer: Self-pay

## 2021-01-10 MED ORDER — ONETOUCH ULTRA VI STRP: ORAL_STRIP | 2 refills | Status: DC

## 2021-01-13 DIAGNOSIS — E119 Type 2 diabetes mellitus without complications: Secondary | ICD-10-CM

## 2021-01-13 DIAGNOSIS — E782 Mixed hyperlipidemia: Secondary | ICD-10-CM | POA: Diagnosis not present

## 2021-01-17 ENCOUNTER — Other Ambulatory Visit: Payer: Self-pay | Admitting: Internal Medicine

## 2021-01-29 DIAGNOSIS — G8929 Other chronic pain: Secondary | ICD-10-CM | POA: Diagnosis not present

## 2021-01-29 DIAGNOSIS — I119 Hypertensive heart disease without heart failure: Secondary | ICD-10-CM | POA: Diagnosis not present

## 2021-01-29 DIAGNOSIS — M48062 Spinal stenosis, lumbar region with neurogenic claudication: Secondary | ICD-10-CM | POA: Diagnosis not present

## 2021-01-29 DIAGNOSIS — Z833 Family history of diabetes mellitus: Secondary | ICD-10-CM | POA: Diagnosis not present

## 2021-01-29 DIAGNOSIS — M47897 Other spondylosis, lumbosacral region: Secondary | ICD-10-CM | POA: Diagnosis not present

## 2021-01-29 DIAGNOSIS — E568 Deficiency of other vitamins: Secondary | ICD-10-CM | POA: Diagnosis not present

## 2021-01-29 DIAGNOSIS — M48061 Spinal stenosis, lumbar region without neurogenic claudication: Secondary | ICD-10-CM | POA: Diagnosis not present

## 2021-01-29 DIAGNOSIS — M792 Neuralgia and neuritis, unspecified: Secondary | ICD-10-CM | POA: Diagnosis not present

## 2021-01-29 DIAGNOSIS — E0840 Diabetes mellitus due to underlying condition with diabetic neuropathy, unspecified: Secondary | ICD-10-CM | POA: Diagnosis not present

## 2021-01-29 DIAGNOSIS — E785 Hyperlipidemia, unspecified: Secondary | ICD-10-CM | POA: Diagnosis not present

## 2021-01-29 DIAGNOSIS — M5136 Other intervertebral disc degeneration, lumbar region: Secondary | ICD-10-CM | POA: Diagnosis not present

## 2021-01-29 DIAGNOSIS — R209 Unspecified disturbances of skin sensation: Secondary | ICD-10-CM | POA: Diagnosis not present

## 2021-01-29 DIAGNOSIS — M4807 Spinal stenosis, lumbosacral region: Secondary | ICD-10-CM | POA: Diagnosis not present

## 2021-01-29 DIAGNOSIS — M79669 Pain in unspecified lower leg: Secondary | ICD-10-CM | POA: Diagnosis not present

## 2021-01-30 ENCOUNTER — Telehealth: Payer: Self-pay | Admitting: Orthopaedic Surgery

## 2021-01-30 ENCOUNTER — Other Ambulatory Visit: Payer: Self-pay | Admitting: Orthopaedic Surgery

## 2021-01-30 MED ORDER — TRAMADOL HCL 50 MG PO TABS: 50.0000 mg | ORAL_TABLET | Freq: Two times a day (BID) | ORAL | 0 refills | Status: DC | PRN

## 2021-01-30 NOTE — Telephone Encounter (Signed)
noted 

## 2021-01-30 NOTE — Telephone Encounter (Signed)
Pt would like to get a refill of his tramadol 50 mg rx and would like to be notified when this has been sent in.   [phone]

## 2021-01-30 NOTE — Telephone Encounter (Signed)
Please advise 

## 2021-02-06 ENCOUNTER — Telehealth: Payer: Self-pay

## 2021-02-06 NOTE — Telephone Encounter (Signed)
° °  Chronic Care Management        Pharmacy Follow Up on Polypharmacy        Objective: Confirm patients statin medication usage and resolve any discrepancies with patients current pharmacy and discuss with patient.   Lab Results  Component Value Date   CREATININE 1.11 11/16/2020   BUN 13 11/16/2020   GFRNONAA 60 01/10/2020   GFRAA 70 01/10/2020   NA 141 11/16/2020   K 4.1 11/16/2020   CALCIUM 9.7 11/16/2020   CO2 22 11/16/2020   GLUCOSE 135 (H) 11/16/2020    Lab Results  Component Value Date/Time   HGBA1C 6.8 (H) 11/16/2020 11:08 AM   HGBA1C 6.8 (H) 07/12/2020 10:39 AM   MICROALBUR 30 07/08/2019 12:51 PM   MICROALBUR 80 06/25/2018 12:39 PM    Last diabetic Eye exam:  Lab Results  Component Value Date/Time   HMDIABEYEEXA No Retinopathy 12/30/2019 12:00 AM    Last diabetic Foot exam: No results found for: HMDIABFOOTEX   Lab Results  Component Value Date   CHOL 137 11/16/2020   HDL 53 11/16/2020   LDLCALC 71 11/16/2020   TRIG 59 11/16/2020   CHOLHDL 2.6 11/16/2020    Hepatic Function Latest Ref Rng & Units 11/16/2020 07/12/2020 10/14/2019  Total Protein 6.0 - 8.5 g/dL 7.2 7.5 7.2  Albumin 3.7 - 4.7 g/dL 4.7 5.0(H) 4.6  AST 0 - 40 IU/L _0 ALT 0 - 44 IU/L _1 Alk Phosphatase 44 - 121 IU/L 94 73 71  Total Bilirubin 0.0 - 1.2 mg/dL 0.6 0.7 0.6    CARE PLAN ENTRY  Current Barriers:  Chronic Disease Management support, education, and care coordination needs related to Diabetes  Pharmacist Clinical Goal(s):  Over the next 120  days, polypharmacy will be reduced as evidenced by making sure patient is taking medication as directed   Interventions: Comprehensive medication review performed. Collaboration with provider, per PCP patients insurance alerted of Simvastatin being filled.  One refill was left on medication -patient did not take the medication [place] and left a voicemail I contacted [place] again made sure that  there were no pending refills left on the Simvastatin.  Medication was discontinued from patients chart and inactivated in patients medication history spoke with pharmacist:  Damaris Schooner with Mr. Meader who is not taking Simvastatin 40 mg tablet. He reports that the medication was filled at the pharmacy and he picked it up but he did not take it he just threw it away.  Spoke with the pharmacist Baxter Flattery, and new prescription was called in by PCP team on 01/17/2021 after refill request sent from [facility].   He reports doing well on Atorvastatin 20 mg tablet daily and has no issues.  I congratulated Mr. Jozwiak on his medication management.  I congratulated patient on keeping up with his BP and BS readings consistently.   Patient Self Care Activities:  Patient verbalizes understanding of plan as described above and Calls provider office for new concerns or questions  Patient's preferred pharmacy is:  [facility] DR[facility] STORE [place], [place] AT [address] [place] [address] Phone: [phone] Fax: [phone]  [facility] [phone] [place], [place] - [address] [place] [address] Phone: [phone] Fax: [phone]  [name], CPP, PharmD Clinical Pharmacist Practitioner [facility] [phone]

## 2021-02-08 DIAGNOSIS — Z7189 Other specified counseling: Secondary | ICD-10-CM | POA: Diagnosis not present

## 2021-02-21 ENCOUNTER — Encounter: Payer: Self-pay | Admitting: Nurse Practitioner

## 2021-02-21 ENCOUNTER — Other Ambulatory Visit: Payer: Self-pay

## 2021-02-21 ENCOUNTER — Ambulatory Visit (INDEPENDENT_AMBULATORY_CARE_PROVIDER_SITE_OTHER): Payer: PPO | Admitting: Nurse Practitioner

## 2021-02-21 VITALS — BP 140/86 | HR 88 | Temp 98.5°F | Ht 69.0 in | Wt 157.4 lb

## 2021-02-21 DIAGNOSIS — I10 Essential (primary) hypertension: Secondary | ICD-10-CM

## 2021-02-21 DIAGNOSIS — E1169 Type 2 diabetes mellitus with other specified complication: Secondary | ICD-10-CM | POA: Diagnosis not present

## 2021-02-21 DIAGNOSIS — G8929 Other chronic pain: Secondary | ICD-10-CM

## 2021-02-21 DIAGNOSIS — M25559 Pain in unspecified hip: Secondary | ICD-10-CM | POA: Diagnosis not present

## 2021-02-21 DIAGNOSIS — E119 Type 2 diabetes mellitus without complications: Secondary | ICD-10-CM | POA: Diagnosis not present

## 2021-02-21 DIAGNOSIS — M545 Low back pain, unspecified: Secondary | ICD-10-CM

## 2021-02-21 DIAGNOSIS — M48061 Spinal stenosis, lumbar region without neurogenic claudication: Secondary | ICD-10-CM

## 2021-02-21 NOTE — Progress Notes (Signed)
I,Katawbba Wiggins,acting as a Education administrator for [employer], FNP.,have documented all relevant documentation on the behalf of [name], FNP,as directed by  [name], FNP while in the presence of [name], [place].  This visit occurred during the SARS-CoV-2 public health emergency.  Safety protocols were in place, including screening questions prior to the visit, additional usage of staff PPE, and extensive cleaning of exam room while observing appropriate contact time as indicated for disinfecting solutions.  Subjective:     Patient ID: Ricky Shields , male    DOB: 1945/08/21 , 76 y.o.   MRN: [id number]   Chief Complaint  Patient presents with   Diabetes   Hypertension    HPI  The patient is here today for a diabetes and blood pressure f/u.   Blood pressure at home was 138/78 this morning, he took his medications this morning.   Diabetes He presents for his follow-up diabetic visit. He has type 2 diabetes mellitus. His disease course has been stable. There are no hypoglycemic associated symptoms. Pertinent negatives for hypoglycemia include no dizziness or headaches. Pertinent negatives for diabetes include no blurred vision, no chest pain, no fatigue, no polydipsia, no polyphagia and no polyuria. There are no hypoglycemic complications. Symptoms are stable. There are no diabetic complications. Risk factors for coronary artery disease include male sex and sedentary lifestyle. Current diabetic treatment includes oral agent (monotherapy). He is compliant with treatment all of the time. His weight is stable. He is following a diabetic diet. When asked about meal planning, he reported none. He has not had a previous visit with a dietitian. He participates in exercise intermittently. (Blood sugar was 123, continues with jardiance and metformin) An ACE inhibitor/angiotensin II receptor blocker is being taken. He does not see a podiatrist.Eye exam is current (scheduled for tomorrow).   Hypertension This is a chronic problem. The current episode started more than 1 year ago. The problem is unchanged. The problem is controlled. Pertinent negatives include no anxiety, blurred vision, chest pain, headaches or palpitations. There are no associated agents to hypertension. Risk factors for coronary artery disease include sedentary lifestyle and male gender. Past treatments include angiotensin blockers and diuretics. The current treatment provides no improvement. There are no compliance problems.  There is no history of angina. There is no history of chronic renal disease.    Past Medical History:  Diagnosis Date   Back pain    intermittent-prior military injury   BPH (benign prostatic hypertrophy) with urinary retention    Diabetes mellitus without complication (HCC)    GERD (gastroesophageal reflux disease)    High cholesterol    Hypertension      Family History  Problem Relation Age of Onset   Stroke Mother    Stroke Father      Current Outpatient Medications:    acetaminophen (TYLENOL) 500 MG tablet, Take 1 tablet (500 mg total) by mouth every 6 (six) hours as needed., Disp: 30 tablet, Rfl: 0   aspirin EC 81 MG tablet, Take 81 mg by mouth daily. Swallow whole., Disp: , Rfl:    atorvastatin (LIPITOR) 20 MG tablet, Take 1 tablet (20 mg total) by mouth daily., Disp: 90 tablet, Rfl: 3   Cholecalciferol (VITAMIN D) 50 MCG (2000 UT) tablet, Take 2,000 Units by mouth daily. , Disp: , Rfl:    glucose blood (ONETOUCH ULTRA) test strip, Use as instructed, Disp: 100 each, Rfl: 2   glucose blood test strip, Use as instructed to test blood sugars three  times a day DX:E11.65, Disp: 100 each, Rfl: 12   JARDIANCE 10 MG TABS tablet, TAKE 1 TABLET(10 MG) BY MOUTH DAILY BEFORE AND BREAKFAST, Disp: 30 tablet, Rfl: 5   meloxicam (MOBIC) 7.5 MG tablet, TAKE 1 TABLET(7.5 MG) BY MOUTH DAILY, Disp: 30 tablet, Rfl: 2   metFORMIN (GLUCOPHAGE) 1000 MG tablet, TAKE 1 TABLET(1000 MG) BY MOUTH TWICE  DAILY WITH A MEAL, Disp: 180 tablet, Rfl: 1   Multiple Vitamin (MULTIVITAMIN WITH MINERALS) TABS tablet, Take 1 tablet by mouth daily. One a Day Men's, Disp: , Rfl:    Olmesartan-amLODIPine-HCTZ 40-10-12.5 MG TABS, TAKE 1 TABLET BY MOUTH DAILY, Disp: 90 tablet, Rfl: 0   tamsulosin (FLOMAX) 0.4 MG CAPS capsule, Take 0.4 mg by mouth daily., Disp: , Rfl: 5   traMADol (ULTRAM) 50 MG tablet, Take 1 tablet (50 mg total) by mouth every 12 (twelve) hours as needed., Disp: 10 tablet, Rfl: 0   valACYclovir (VALTREX) 500 MG tablet, Take 500 mg by mouth daily., Disp: , Rfl:    mometasone (NASONEX) 50 MCG/ACT nasal spray, Place 2 sprays into the nose daily., Disp: 17 g, Rfl: 2   Allergies  Allergen Reactions   Penicillins Rash    Has patient had a PCN reaction causing immediate rash, facial/tongue/throat swelling, SOB or lightheadedness with hypotension: No Has patient had a PCN reaction causing severe rash involving mucus membranes or skin necrosis: No Has patient had a PCN reaction that required hospitalization No Has patient had a PCN reaction occurring within the last 10 years: No If all of the above answers are "NO", then may proceed with Cephalosporin use.      Review of Systems  Constitutional: Negative.  Negative for fatigue.  Eyes:  Negative for blurred vision.  Respiratory: Negative.    Cardiovascular: Negative.  Negative for chest pain, palpitations and leg swelling.  Gastrointestinal: Negative.   Endocrine: Negative for polydipsia, polyphagia and polyuria.  Neurological:  Negative for dizziness and headaches.  Psychiatric/Behavioral: Negative.    All other systems reviewed and are negative.   Today's Vitals   02/21/21 1111  BP: 140/86  Pulse: 88  Temp: 98.5 F (36.9 C)  Weight: 157 lb 6.4 oz (71.4 kg)  Height: _0  (1.753 m)  PainSc: 9   PainLoc: Hip   Body mass index is 23.24 kg/m.  Wt Readings from Last 3 Encounters:  02/21/21 157 lb 6.4 oz (71.4 kg)  11/16/20 160 lb  9.6 oz (72.8 kg)  09/05/20 160 lb (72.6 kg)    BP Readings from Last 3 Encounters:  02/21/21 140/86  11/16/20 124/82  09/05/20 129/65    Objective:  Physical Exam Vitals reviewed.  Constitutional:      General: He is not in acute distress.    Appearance: Normal appearance.  Cardiovascular:     Rate and Rhythm: Normal rate and regular rhythm.     Pulses: Normal pulses.     Heart sounds: Normal heart sounds. No murmur heard. Pulmonary:     Effort: Pulmonary effort is normal. No respiratory distress.     Breath sounds: Normal breath sounds. No wheezing.  Musculoskeletal:        General: No swelling. Normal range of motion.     Right lower leg: No edema.     Left lower leg: No edema.  Skin:    General: Skin is warm and dry.     Capillary Refill: Capillary refill takes less than 2 seconds.     Coloration: Skin is not jaundiced.  Neurological:     General: No focal deficit present.     Mental Status: He is alert and oriented to person, place, and time.     Cranial Nerves: No cranial nerve deficit.     Motor: No weakness.  Psychiatric:        Mood and Affect: Mood normal.        Behavior: Behavior normal.        Thought Content: Thought content normal.        Judgment: Judgment normal.        Assessment And Plan:     1. Type 2 diabetes mellitus without complication, without long-term current use of insulin (HCC) Chronic, stable at 6.8, tolerating metformin well Continue with current medications Encouraged to limit intake of sugary foods and drinks Encouraged to increase physical activity to 150 minutes per week Diabetic foot exam done, no abnormal findings Completed a medical record release for his diabetic eye exam from Dr. Venetia Maxon. - Hemoglobin A1c - BMP8+EGFR  2. Essential hypertension B/P is slightly elevated today,encouraged to limit intake of high salt foods  CMP ordered to check renal function.  The importance of regular exercise and dietary modification was  stressed to the patient.  - BMP8+EGFR  3. Spinal stenosis of lumbar region without neurogenic claudication Comments: He is not interested in surgery and is debating on canceling his appt with Dr. [name]. Reviewed Dr. [name] records from 08/2020.  - Ambulatory referral to Physical Therapy  4. Hip pain Comments: He is now going to the pain clinic and has his second appt with pain management 2/13.  - Ambulatory referral to Physical Therapy  5. Chronic right-sided low back pain without sciatica Referral placed for PT at [facility] at patients request   Patient was given opportunity to ask questions. Patient verbalized understanding of the plan and was able to repeat key elements of the plan. All questions were answered to their satisfaction.  [name], FNP   I, [name], FNP, have reviewed all documentation for this visit. The documentation on 02/21/21 for the exam, diagnosis, procedures, and orders are all accurate and complete.   IF YOU HAVE BEEN REFERRED TO A SPECIALIST, IT MAY TAKE 1-2 WEEKS TO SCHEDULE/PROCESS THE REFERRAL. IF YOU HAVE NOT HEARD FROM US/SPECIALIST IN TWO WEEKS, PLEASE GIVE [place] A CALL AT [phone].   THE PATIENT IS ENCOURAGED TO PRACTICE SOCIAL DISTANCING DUE TO THE COVID-19 PANDEMIC.

## 2021-02-22 LAB — BMP8+EGFR
BUN/Creatinine Ratio: 12 (ref 10–24)
BUN: 13 mg/dL (ref 8–27)
CO2: 21 mmol/L (ref 20–29)
Calcium: 9.9 mg/dL (ref 8.6–10.2)
Chloride: 103 mmol/L (ref 96–106)
Creatinine, Ser: 1.06 mg/dL (ref 0.76–1.27)
Glucose: 131 mg/dL — ABNORMAL HIGH (ref 70–99)
Potassium: 4.3 mmol/L (ref 3.5–5.2)
Sodium: 142 mmol/L (ref 134–144)
eGFR: 73 mL/min/{1.73_m2} (ref >59)

## 2021-02-22 LAB — HEMOGLOBIN A1C
Est. average glucose Bld gHb Est-mCnc: 148 mg/dL
Hgb A1c MFr Bld: 6.8 % — ABNORMAL HIGH (ref 4.8–5.6)

## 2021-02-26 DIAGNOSIS — M5136 Other intervertebral disc degeneration, lumbar region: Secondary | ICD-10-CM | POA: Diagnosis not present

## 2021-02-26 DIAGNOSIS — G8929 Other chronic pain: Secondary | ICD-10-CM | POA: Diagnosis not present

## 2021-02-26 DIAGNOSIS — G894 Chronic pain syndrome: Secondary | ICD-10-CM | POA: Diagnosis not present

## 2021-02-26 DIAGNOSIS — M25559 Pain in unspecified hip: Secondary | ICD-10-CM | POA: Diagnosis not present

## 2021-03-07 ENCOUNTER — Other Ambulatory Visit: Payer: Self-pay

## 2021-03-07 MED ORDER — OLMESARTAN-AMLODIPINE-HCTZ 40-10-12.5 MG PO TABS: 1.0000 | ORAL_TABLET | Freq: Every day | ORAL | 0 refills | Status: DC

## 2021-03-12 ENCOUNTER — Other Ambulatory Visit: Payer: Self-pay

## 2021-03-12 MED ORDER — OLMESARTAN-AMLODIPINE-HCTZ 40-10-12.5 MG PO TABS: 1.0000 | ORAL_TABLET | Freq: Every day | ORAL | 0 refills | Status: DC

## 2021-03-13 ENCOUNTER — Ambulatory Visit: Payer: PPO | Admitting: Orthopaedic Surgery

## 2021-03-19 DIAGNOSIS — M5451 Vertebrogenic low back pain: Secondary | ICD-10-CM | POA: Diagnosis not present

## 2021-03-26 DIAGNOSIS — M5451 Vertebrogenic low back pain: Secondary | ICD-10-CM | POA: Diagnosis not present

## 2021-03-26 DIAGNOSIS — G8929 Other chronic pain: Secondary | ICD-10-CM | POA: Diagnosis not present

## 2021-03-26 DIAGNOSIS — G894 Chronic pain syndrome: Secondary | ICD-10-CM | POA: Diagnosis not present

## 2021-03-26 DIAGNOSIS — M5136 Other intervertebral disc degeneration, lumbar region: Secondary | ICD-10-CM | POA: Diagnosis not present

## 2021-03-28 DIAGNOSIS — M5451 Vertebrogenic low back pain: Secondary | ICD-10-CM | POA: Diagnosis not present

## 2021-04-02 DIAGNOSIS — M5451 Vertebrogenic low back pain: Secondary | ICD-10-CM | POA: Diagnosis not present

## 2021-04-04 DIAGNOSIS — M5451 Vertebrogenic low back pain: Secondary | ICD-10-CM | POA: Diagnosis not present

## 2021-04-09 DIAGNOSIS — M5451 Vertebrogenic low back pain: Secondary | ICD-10-CM | POA: Diagnosis not present

## 2021-04-11 DIAGNOSIS — M5451 Vertebrogenic low back pain: Secondary | ICD-10-CM | POA: Diagnosis not present

## 2021-04-16 DIAGNOSIS — M5451 Vertebrogenic low back pain: Secondary | ICD-10-CM | POA: Diagnosis not present

## 2021-04-18 DIAGNOSIS — M5451 Vertebrogenic low back pain: Secondary | ICD-10-CM | POA: Diagnosis not present

## 2021-04-24 ENCOUNTER — Ambulatory Visit (INDEPENDENT_AMBULATORY_CARE_PROVIDER_SITE_OTHER): Payer: PPO | Admitting: Nurse Practitioner

## 2021-04-24 ENCOUNTER — Telehealth: Payer: Self-pay

## 2021-04-24 ENCOUNTER — Encounter: Payer: Self-pay | Admitting: Nurse Practitioner

## 2021-04-24 VITALS — BP 124/80 | HR 104 | Temp 98.6°F | Ht 69.0 in | Wt 157.4 lb

## 2021-04-24 DIAGNOSIS — G8929 Other chronic pain: Secondary | ICD-10-CM | POA: Diagnosis not present

## 2021-04-24 DIAGNOSIS — M48061 Spinal stenosis, lumbar region without neurogenic claudication: Secondary | ICD-10-CM

## 2021-04-24 DIAGNOSIS — M5451 Vertebrogenic low back pain: Secondary | ICD-10-CM | POA: Diagnosis not present

## 2021-04-24 DIAGNOSIS — M5441 Lumbago with sciatica, right side: Secondary | ICD-10-CM | POA: Diagnosis not present

## 2021-04-24 DIAGNOSIS — M5136 Other intervertebral disc degeneration, lumbar region: Secondary | ICD-10-CM | POA: Diagnosis not present

## 2021-04-24 DIAGNOSIS — G894 Chronic pain syndrome: Secondary | ICD-10-CM | POA: Diagnosis not present

## 2021-04-24 MED ORDER — KETOROLAC TROMETHAMINE 30 MG/ML IJ SOLN
30.0000 mg | Freq: Once | INTRAMUSCULAR | Status: AC
  Administered 2021-04-24: 30 mg via INTRAMUSCULAR

## 2021-04-24 NOTE — Patient Instructions (Signed)

## 2021-04-24 NOTE — Progress Notes (Signed)
?[employer] as a Education administrator for [employer], FNP.,have documented all relevant documentation on the behalf of Ricky Brine, FNP,as directed by  Ricky Brine, FNP while in the presence of Ricky Shields, [place].  ? ?This visit occurred during the SARS-CoV-2 public health emergency.  Safety protocols were in place, including screening questions prior to the visit, additional usage of staff PPE, and extensive cleaning of exam room while observing appropriate contact time as indicated for disinfecting solutions. ? ?Subjective:  ?  ? Patient ID: Ricky Shields , male    DOB: 1945/07/11 , 76 y.o.   MRN: [id number] ? ? ?Chief Complaint  ?Patient presents with  ? Referral  ?  neurologist  ? ? ?HPI ? ?The patient is here today for a neurology referral to Dr. Frederich Cha at [facility].  ? ?Wt Readings from Last 3 Encounters: ?04/24/21 : 157 lb 6.4 oz (71.4 kg) ?02/21/21 : 157 lb 6.4 oz (71.4 kg) ?11/16/20 : 160 lb 9.6 oz (72.8 kg) ? ? ? ?Back Pain ?This is a chronic problem. The current episode started more than 1 year ago. The problem occurs constantly. The pain is present in the lumbar spine. The quality of the pain is described as shooting, stabbing and aching. The pain radiates to the right thigh. The pain is at a severity of 9/10 (In the last 24 hours). The pain is severe. The symptoms are aggravated by sitting, standing and bending. Associated symptoms include tingling. Pertinent negatives include no abdominal pain, fever, numbness, paresthesias or weakness. He has tried analgesics (gabapentin) for the symptoms.   ? ?Past Medical History:  ?Diagnosis Date  ? Back pain   ? intermittent-prior military injury  ? BPH (benign prostatic hypertrophy) with urinary retention   ? Diabetes mellitus without complication ([place])   ? GERD (gastroesophageal reflux disease)   ? High cholesterol   ? Hypertension   ?  ? ?Family History  ?Problem Relation Age of Onset  ? Stroke Mother   ? Stroke Father   ? ? ? ?Current  Outpatient Medications:  ?  acetaminophen (TYLENOL) 500 MG tablet, Take 1 tablet (500 mg total) by mouth every 6 (six) hours as needed., Disp: 30 tablet, Rfl: 0 ?  aspirin EC 81 MG tablet, Take 81 mg by mouth daily. Swallow whole., Disp: , Rfl:  ?  atorvastatin (LIPITOR) 20 MG tablet, Take 1 tablet (20 mg total) by mouth daily., Disp: 90 tablet, Rfl: 3 ?  Cholecalciferol (VITAMIN D) 50 MCG (2000 UT) tablet, Take 2,000 Units by mouth daily. , Disp: , Rfl:  ?  glucose blood (ONETOUCH ULTRA) test strip, Use as instructed, Disp: 100 each, Rfl: 2 ?  JARDIANCE 10 MG TABS tablet, TAKE 1 TABLET(10 MG) BY MOUTH DAILY BEFORE AND BREAKFAST, Disp: 30 tablet, Rfl: 5 ?  metFORMIN (GLUCOPHAGE) 1000 MG tablet, TAKE 1 TABLET(1000 MG) BY MOUTH TWICE DAILY WITH A MEAL, Disp: 180 tablet, Rfl: 1 ?  mometasone (NASONEX) 50 MCG/ACT nasal spray, Place 2 sprays into the nose daily., Disp: 17 g, Rfl: 2 ?  Multiple Vitamin (MULTIVITAMIN WITH MINERALS) TABS tablet, Take 1 tablet by mouth daily. One a Day Men's, Disp: , Rfl:  ?  Olmesartan-amLODIPine-HCTZ 40-10-12.5 MG TABS, Take 1 tablet by mouth daily., Disp: 90 tablet, Rfl: 0 ?  tamsulosin (FLOMAX) 0.4 MG CAPS capsule, Take 0.4 mg by mouth daily., Disp: , Rfl: 5 ?  traMADol (ULTRAM) 50 MG tablet, Take 1 tablet (50 mg total) by mouth every 12 (twelve) hours  as needed., Disp: 10 tablet, Rfl: 0 ?  valACYclovir (VALTREX) 500 MG tablet, Take 500 mg by mouth daily., Disp: , Rfl:  ?  glucose blood test strip, Use as instructed to test blood sugars three times a day DX:E11.65, Disp: 100 each, Rfl: 12 ? ?Current Facility-Administered Medications:  ?  ketorolac (TORADOL) 30 MG/ML injection 30 mg, 30 mg, Intramuscular, Once, Ricky Brine, FNP  ? ?Allergies  ?Allergen Reactions  ? Penicillins Rash  ?  Has patient had a PCN reaction causing immediate rash, facial/tongue/throat swelling, SOB or lightheadedness with hypotension: No ?Has patient had a PCN reaction causing severe rash involving mucus  membranes or skin necrosis: No ?Has patient had a PCN reaction that required hospitalization No ?Has patient had a PCN reaction occurring within the last 10 years: No ?If all of the above answers are "NO", then may proceed with Cephalosporin use. ?  ?  ? ?Review of Systems  ?Constitutional: Negative.  Negative for fever.  ?Respiratory: Negative.    ?Cardiovascular: Negative.   ?Gastrointestinal: Negative.  Negative for abdominal pain.  ?Musculoskeletal:  Positive for back pain.  ?Neurological:  Positive for tingling. Negative for weakness, numbness and paresthesias.  ?Psychiatric/Behavioral: Negative.    ?All other systems reviewed and are negative.  ? ?Today's Vitals  ? 04/24/21 1600  ?BP: 124/80  ?Pulse: (!) 104  ?Temp: 98.6 ?F (37 ?C)  ?Weight: 157 lb 6.4 oz (71.4 kg)  ?Height: '5\' 9"'$  (1.753 m)  ? ?Body mass index is 23.24 kg/m?.  ?Wt Readings from Last 3 Encounters:  ?04/24/21 157 lb 6.4 oz (71.4 kg)  ?02/21/21 157 lb 6.4 oz (71.4 kg)  ?11/16/20 160 lb 9.6 oz (72.8 kg)  ?  ?BP Readings from Last 3 Encounters:  ?04/24/21 124/80  ?02/21/21 140/86  ?11/16/20 124/82  ?  ?Objective:  ?Physical Exam ?Vitals reviewed.  ?Constitutional:   ?   General: He is not in acute distress. ?   Appearance: Normal appearance.  ?Cardiovascular:  ?   Rate and Rhythm: Normal rate and regular rhythm.  ?   Pulses: Normal pulses.  ?   Heart sounds: Normal heart sounds. No murmur heard. ?Pulmonary:  ?   Effort: No respiratory distress.  ?   Breath sounds: Normal breath sounds. No wheezing.  ?Musculoskeletal:     ?   General: No swelling.  ?Skin: ?   General: Skin is warm and dry.  ?   Capillary Refill: Capillary refill takes less than 2 seconds.  ?Neurological:  ?   General: No focal deficit present.  ?   Mental Status: He is alert and oriented to person, place, and time.  ?   Cranial Nerves: No cranial nerve deficit.  ?Psychiatric:     ?   Mood and Affect: Mood normal.     ?   Behavior: Behavior normal.     ?   Thought Content: Thought  content normal.     ?   Judgment: Judgment normal.  ?  ? ?   ?Assessment And Plan:  ?   ?1. Spinal stenosis of lumbar region without neurogenic claudication ?Comments: will refer to Neurosurgery for evaluation at patient request Dr. Frederich Cha ?- Ambulatory referral to Neurosurgery ?- ketorolac (TORADOL) 30 MG/ML injection 30 mg ? ?2. Chronic bilateral low back pain with right-sided sciatica ?Comments: Pain rating 9/10 will treat with Toradol injection.  ?- Ambulatory referral to Neurosurgery ?- ketorolac (TORADOL) 30 MG/ML injection 30 mg ?  ?Concerned about weight loss, has only  lost 3 lbs since November he is taking Jardiance so this can be a contributing factor. Encouraged to do some free lightl weights to help with muscle mass ? ?Patient was given opportunity to ask questions. Patient verbalized understanding of the plan and was able to repeat key elements of the plan. All questions were answered to their satisfaction.  ?Ricky Brine, FNP  ? ?I, Ricky Brine, FNP, have reviewed all documentation for this visit. The documentation on 04/24/21 for the exam, diagnosis, procedures, and orders are all accurate and complete.  ? ?IF YOU HAVE BEEN REFERRED TO A SPECIALIST, IT MAY TAKE 1-2 WEEKS TO SCHEDULE/PROCESS THE REFERRAL. IF YOU HAVE NOT HEARD FROM US/SPECIALIST IN TWO WEEKS, PLEASE GIVE [place] A CALL AT [phone].  ? ?THE PATIENT IS ENCOURAGED TO PRACTICE SOCIAL DISTANCING DUE TO THE COVID-19 PANDEMIC.   ?

## 2021-04-24 NOTE — Chronic Care Management (AMB) (Signed)
? ? ?  Chronic Care Management ?Pharmacy Assistant  ? ?Name: Ricky Shields  MRN: [id number] DOB: Feb 21, 1945 ? ? ?Reason for Encounter: Disease State/ General ? ?Recent office visits:  ?02-21-2021 [name], [place]. Glucose= 131. A1C= 6.8. Referral placed to physical therapy. ? ?Recent consult visits:  ?02-26-2021 Mohammed Kindle, MD (Anesthesiology). Unable to view encounter. ? ?01-29-2021 Mohammed Kindle, MD (Anesthesiology). Unable to view encounter. ? ?Hospital visits:  ?None in previous 6 months ? ?Medications: ?Outpatient Encounter Medications as of 04/24/2021  ?Medication Sig  ? acetaminophen (TYLENOL) 500 MG tablet Take 1 tablet (500 mg total) by mouth every 6 (six) hours as needed.  ? aspirin EC 81 MG tablet Take 81 mg by mouth daily. Swallow whole.  ? atorvastatin (LIPITOR) 20 MG tablet Take 1 tablet (20 mg total) by mouth daily.  ? Cholecalciferol (VITAMIN D) 50 MCG (2000 UT) tablet Take 2,000 Units by mouth daily.   ? glucose blood (ONETOUCH ULTRA) test strip Use as instructed  ? glucose blood test strip Use as instructed to test blood sugars three times a day DX:E11.65  ? JARDIANCE 10 MG TABS tablet TAKE 1 TABLET(10 MG) BY MOUTH DAILY BEFORE AND BREAKFAST  ? meloxicam (MOBIC) 7.5 MG tablet TAKE 1 TABLET(7.5 MG) BY MOUTH DAILY  ? metFORMIN (GLUCOPHAGE) 1000 MG tablet TAKE 1 TABLET(1000 MG) BY MOUTH TWICE DAILY WITH A MEAL  ? mometasone (NASONEX) 50 MCG/ACT nasal spray Place 2 sprays into the nose daily.  ? Multiple Vitamin (MULTIVITAMIN WITH MINERALS) TABS tablet Take 1 tablet by mouth daily. One a Day Men's  ? Olmesartan-amLODIPine-HCTZ 40-10-12.5 MG TABS Take 1 tablet by mouth daily.  ? tamsulosin (FLOMAX) 0.4 MG CAPS capsule Take 0.4 mg by mouth daily.  ? traMADol (ULTRAM) 50 MG tablet Take 1 tablet (50 mg total) by mouth every 12 (twelve) hours as needed.  ? valACYclovir (VALTREX) 500 MG tablet Take 500 mg by mouth daily.  ? ?No facility-administered encounter medications on file as of 04/24/2021.   ?Contacted [name] for general disease state and medication adherence call.  ? ?Patient is not > 5 days past due for refill on the following medications per chart history: ? ? ?What concerns do you have about your medications? Patient stated no ? ?The patient denies side effects with his medications.  ? ?How often do you forget or accidentally miss a dose? Never ? ?Do you use a pillbox? No ? ?Are you having any problems getting your medications from your pharmacy? No ? ?Has the cost of your medications been a concern? No ? ?Since last visit with CPP, the following interventions have been made: Patient stated blood sugars and blood pressures have been doing good. ? ?The patient has not had an ED visit since last contact.  ? ?The patient denies problems with their health.  ? ?he denies  concerns or questions for [name], at this time.  ? ?Patient states BP readings are as follows: 137/81, 133/79, 126/74 ? ?Fasting: 115, 121, 119 ?Before meals: None ?After meals: None ?Bedtime: None ? ?Care Gaps: ?Yearly ophthalmology exam overdue ?AWV 08-09-2021 ? ?Star Rating Drugs: ?Atorvastatin 20 mg- Last filled 03-28-2021 90 DS [name] ?Olmesartan 40 mg- Last filled 04-06-2021 90 DS [name] ?Jardiance 10 mg- Last filled 04-12-2021 30 DS [name] ?Metformin 1000 mg- Last filled 03-20-2021 90 DS [name] ? ?Malecca Hicks CMA ?Clinical Pharmacist Assistant ?[phone] ? ?

## 2021-04-25 DIAGNOSIS — M5451 Vertebrogenic low back pain: Secondary | ICD-10-CM | POA: Diagnosis not present

## 2021-04-30 DIAGNOSIS — M5451 Vertebrogenic low back pain: Secondary | ICD-10-CM | POA: Diagnosis not present

## 2021-05-02 DIAGNOSIS — M5451 Vertebrogenic low back pain: Secondary | ICD-10-CM | POA: Diagnosis not present

## 2021-05-07 DIAGNOSIS — M5451 Vertebrogenic low back pain: Secondary | ICD-10-CM | POA: Diagnosis not present

## 2021-05-15 ENCOUNTER — Other Ambulatory Visit: Payer: Self-pay

## 2021-05-15 DIAGNOSIS — E119 Type 2 diabetes mellitus without complications: Secondary | ICD-10-CM

## 2021-05-15 MED ORDER — EMPAGLIFLOZIN 10 MG PO TABS: ORAL_TABLET | ORAL | 5 refills | Status: DC

## 2021-06-04 DIAGNOSIS — M5451 Vertebrogenic low back pain: Secondary | ICD-10-CM | POA: Diagnosis not present

## 2021-06-05 DIAGNOSIS — G8929 Other chronic pain: Secondary | ICD-10-CM | POA: Diagnosis not present

## 2021-06-05 DIAGNOSIS — Z833 Family history of diabetes mellitus: Secondary | ICD-10-CM | POA: Diagnosis not present

## 2021-06-05 DIAGNOSIS — G894 Chronic pain syndrome: Secondary | ICD-10-CM | POA: Diagnosis not present

## 2021-06-05 DIAGNOSIS — E0840 Diabetes mellitus due to underlying condition with diabetic neuropathy, unspecified: Secondary | ICD-10-CM | POA: Diagnosis not present

## 2021-06-05 DIAGNOSIS — M792 Neuralgia and neuritis, unspecified: Secondary | ICD-10-CM | POA: Diagnosis not present

## 2021-06-05 DIAGNOSIS — E568 Deficiency of other vitamins: Secondary | ICD-10-CM | POA: Diagnosis not present

## 2021-06-05 DIAGNOSIS — E785 Hyperlipidemia, unspecified: Secondary | ICD-10-CM | POA: Diagnosis not present

## 2021-06-05 DIAGNOSIS — M25559 Pain in unspecified hip: Secondary | ICD-10-CM | POA: Diagnosis not present

## 2021-06-05 DIAGNOSIS — M4807 Spinal stenosis, lumbosacral region: Secondary | ICD-10-CM | POA: Diagnosis not present

## 2021-06-05 DIAGNOSIS — Z8249 Family history of ischemic heart disease and other diseases of the circulatory system: Secondary | ICD-10-CM | POA: Diagnosis not present

## 2021-06-05 DIAGNOSIS — M79669 Pain in unspecified lower leg: Secondary | ICD-10-CM | POA: Diagnosis not present

## 2021-06-05 DIAGNOSIS — M48062 Spinal stenosis, lumbar region with neurogenic claudication: Secondary | ICD-10-CM | POA: Diagnosis not present

## 2021-06-05 DIAGNOSIS — M5136 Other intervertebral disc degeneration, lumbar region: Secondary | ICD-10-CM | POA: Diagnosis not present

## 2021-06-05 DIAGNOSIS — M47897 Other spondylosis, lumbosacral region: Secondary | ICD-10-CM | POA: Diagnosis not present

## 2021-06-06 ENCOUNTER — Other Ambulatory Visit: Payer: Self-pay

## 2021-06-06 MED ORDER — OLMESARTAN-AMLODIPINE-HCTZ 40-10-12.5 MG PO TABS: 1.0000 | ORAL_TABLET | Freq: Every day | ORAL | 1 refills | Status: DC

## 2021-06-07 ENCOUNTER — Other Ambulatory Visit: Payer: Self-pay

## 2021-06-07 MED ORDER — OLMESARTAN-AMLODIPINE-HCTZ 40-10-12.5 MG PO TABS
1.0000 | ORAL_TABLET | Freq: Every day | ORAL | 1 refills | Status: DC
Start: 2021-06-07 — End: 2021-11-30

## 2021-06-08 ENCOUNTER — Encounter: Payer: Self-pay | Admitting: Nurse Practitioner

## 2021-06-12 DIAGNOSIS — M48062 Spinal stenosis, lumbar region with neurogenic claudication: Secondary | ICD-10-CM | POA: Diagnosis not present

## 2021-06-13 DIAGNOSIS — M5451 Vertebrogenic low back pain: Secondary | ICD-10-CM | POA: Diagnosis not present

## 2021-06-18 DIAGNOSIS — M5451 Vertebrogenic low back pain: Secondary | ICD-10-CM | POA: Diagnosis not present

## 2021-06-19 ENCOUNTER — Other Ambulatory Visit: Payer: Self-pay

## 2021-06-19 DIAGNOSIS — E119 Type 2 diabetes mellitus without complications: Secondary | ICD-10-CM

## 2021-06-19 MED ORDER — METFORMIN HCL 1000 MG PO TABS: ORAL_TABLET | ORAL | 1 refills | Status: DC

## 2021-06-20 DIAGNOSIS — M5451 Vertebrogenic low back pain: Secondary | ICD-10-CM | POA: Diagnosis not present

## 2021-06-25 ENCOUNTER — Other Ambulatory Visit: Payer: Self-pay | Admitting: Neurosurgery

## 2021-06-25 DIAGNOSIS — M48062 Spinal stenosis, lumbar region with neurogenic claudication: Secondary | ICD-10-CM

## 2021-06-27 DIAGNOSIS — M5451 Vertebrogenic low back pain: Secondary | ICD-10-CM | POA: Diagnosis not present

## 2021-06-29 ENCOUNTER — Telehealth: Payer: Self-pay

## 2021-06-29 NOTE — Chronic Care Management (AMB) (Cosign Needed)
     [facility] was reminded to have all medications, supplements and any blood glucose and blood pressure readings available for review with [name], Pharm. D, at his telephone visit on 07-03-2021 at 3:20.     [place] Pharmacist Assistant [phone]

## 2021-07-02 DIAGNOSIS — M5451 Vertebrogenic low back pain: Secondary | ICD-10-CM | POA: Diagnosis not present

## 2021-07-03 ENCOUNTER — Ambulatory Visit (INDEPENDENT_AMBULATORY_CARE_PROVIDER_SITE_OTHER): Payer: PPO

## 2021-07-03 DIAGNOSIS — E119 Type 2 diabetes mellitus without complications: Secondary | ICD-10-CM

## 2021-07-03 DIAGNOSIS — M48061 Spinal stenosis, lumbar region without neurogenic claudication: Secondary | ICD-10-CM | POA: Diagnosis not present

## 2021-07-03 DIAGNOSIS — G894 Chronic pain syndrome: Secondary | ICD-10-CM | POA: Diagnosis not present

## 2021-07-03 DIAGNOSIS — I10 Essential (primary) hypertension: Secondary | ICD-10-CM

## 2021-07-03 DIAGNOSIS — M5136 Other intervertebral disc degeneration, lumbar region: Secondary | ICD-10-CM | POA: Diagnosis not present

## 2021-07-03 NOTE — Progress Notes (Unsigned)
Opened duplicate note in error

## 2021-07-03 NOTE — Progress Notes (Unsigned)
Chronic Care Management Pharmacy Note  07/04/2021 Name:  Ricky Shields MRN:  [id number] DOB:  20-Feb-1945  Summary: Patient reports that he is taking his medication every day. He reports that he is going to pick up his atorvastatin from the pharmacy.    Recommendations/Changes made from today's visit: Recommend patient receive COVID-19 booster vaccine.  Plan: Patient reports that he is going to try to get his COVID-19 booster shot at [facility].     Subjective: Ricky Shields is an 76 y.o. year old male who is a primary patient of [name], [place].  The CCM team was consulted for assistance with disease management and care coordination needs.    Engaged with patient by telephone for follow up visit in response to provider referral for pharmacy case management and/or care coordination services. Patient reports that he is doing well. He is being seen in physical therapy for his back, and is also seeing a back specialist that is really helping him.   Consent to Services:  The patient was given information about Chronic Care Management services, agreed to services, and gave verbal consent prior to initiation of services.  Please see initial visit note for detailed documentation.   Patient Care Team: [name], FNP as PCP - General (General Practice) Mayford Knife, [facility] (Pharmacist)  Recent office visits: 04/24/2021 PCP OV 02/22/2020 PCP OV  Recent consult visits: 04/18/2021 Specialist Visit  Hospital visits: None in previous 6 months   Objective:  Lab Results  Component Value Date   CREATININE 1.06 02/21/2021   BUN 13 02/21/2021   EGFR 73 02/21/2021   GFRNONAA 60 01/10/2020   GFRAA 70 01/10/2020   NA 142 02/21/2021   K 4.3 02/21/2021   CALCIUM 9.9 02/21/2021   CO2 21 02/21/2021   GLUCOSE 131 (H) 02/21/2021    Lab Results  Component Value Date/Time   HGBA1C 6.8 (H) 02/21/2021 12:56 PM   HGBA1C 6.8 (H) 11/16/2020 11:08 AM   MICROALBUR 30 07/08/2019 12:51 PM    MICROALBUR 80 06/25/2018 12:39 PM    Last diabetic Eye exam:  Lab Results  Component Value Date/Time   HMDIABEYEEXA No Retinopathy 01/02/2021 12:00 AM    Last diabetic Foot exam: No results found for: "HMDIABFOOTEX"   Lab Results  Component Value Date   CHOL 137 11/16/2020   HDL 53 11/16/2020   LDLCALC 71 11/16/2020   TRIG 59 11/16/2020   CHOLHDL 2.6 11/16/2020       Latest Ref Rng & Units 11/16/2020   11:08 AM 07/12/2020   10:39 AM 10/14/2019    2:21 PM  Hepatic Function  Total Protein 6.0 - 8.5 g/dL 7.2  7.5  7.2   Albumin 3.7 - 4.7 g/dL 4.7  5.0  4.6   AST 0 - 40 IU/L _0 ALT 0 - 44 IU/L _1 Alk Phosphatase 44 - 121 IU/L 94  73  71   Total Bilirubin 0.0 - 1.2 mg/dL 0.6  0.7  0.6     No results found for: "TSH", "FREET4"     Latest Ref Rng & Units 07/12/2020   10:39 AM 07/08/2019    2:07 PM 06/25/2018    2:05 PM  CBC  WBC 3.4 - 10.8 x10E3/uL 5.9  6.4  5.5   Hemoglobin 13.0 - 17.7 g/dL 14.9  14.3  13.9   Hematocrit 37.5 - 51.0 % 43.2  39.8  37.6   Platelets 150 -  450 x10E3/uL 262  250  229     No results found for: "VD25OH"  Clinical ASCVD: No  The 10-year ASCVD risk score (Arnett DK, et al., 2019) is: 31.6%   Values used to calculate the score:     Age: 76 years     Sex: Male     Is Non-Hispanic African American: Yes     Diabetic: Yes     Tobacco smoker: No     Systolic Blood Pressure: 742 mmHg     Is BP treated: No     HDL Cholesterol: 53 mg/dL     Total Cholesterol: 137 mg/dL       07/13/2020   11:06 AM 07/12/2020    9:53 AM 07/08/2019   11:36 AM  Depression screen PHQ 2/9  Decreased Interest 0 0 0  Down, Depressed, Hopeless 0 0 0  PHQ - 2 Score 0 0 0  Altered sleeping  0 0  Tired, decreased energy  0 0  Change in appetite  0 0  Feeling bad or failure about yourself   0 0  Trouble concentrating  0 0  Moving slowly or fidgety/restless  0 0  Suicidal thoughts  0 0  PHQ-9 Score  0 0  Difficult doing work/chores   Not difficult  at all     Social History   Tobacco Use  Smoking Status Never  Smokeless Tobacco Never   BP Readings from Last 3 Encounters:  04/24/21 124/80  02/21/21 140/86  11/16/20 124/82   Pulse Readings from Last 3 Encounters:  04/24/21 (!) 104  02/21/21 88  11/16/20 85   Wt Readings from Last 3 Encounters:  04/24/21 157 lb 6.4 oz (71.4 kg)  02/21/21 157 lb 6.4 oz (71.4 kg)  11/16/20 160 lb 9.6 oz (72.8 kg)   BMI Readings from Last 3 Encounters:  04/24/21 23.24 kg/m  02/21/21 23.24 kg/m  11/16/20 23.72 kg/m    Assessment/Interventions: Review of patient past medical history, allergies, medications, health status, including review of consultants reports, laboratory and other test data, was performed as part of comprehensive evaluation and provision of chronic care management services.   SDOH:  (Social Determinants of Health) assessments and interventions performed: No  SDOH Screenings   Alcohol Screen: Not on file  Depression (PHQ2-9): Low Risk  (07/13/2020)   Depression (PHQ2-9)    PHQ-2 Score: 0  Financial Resource Strain: Low Risk  (07/13/2020)   Overall Financial Resource Strain (CARDIA)    Difficulty of Paying Living Expenses: Not hard at all  Food Insecurity: No Food Insecurity (07/13/2020)   Hunger Vital Sign    Worried About Running Out of Food in the Last Year: Never true    [place] in the Last Year: Never true  Housing: Low Risk  (07/12/2020)   Housing    Last Housing Risk Score: 0  Physical Activity: Sufficiently Active (07/13/2020)   Exercise Vital Sign    Days of Exercise per Week: 3 days    Minutes of Exercise per Session: 90 min  Social Connections: Socially Integrated (12/23/2019)   Social Connection and Isolation Panel [NHANES]    Frequency of Communication with Friends and Family: More than three times a week    Frequency of Social Gatherings with Friends and Family: More than three times a week    Attends Religious Services: More than 4 times per  year    Active Member of [employer] or Organizations: Yes    Attends Archivist  Meetings: More than 4 times per year    Marital Status: Married  Stress: No Stress Concern Present (07/13/2020)   [place]    Feeling of Stress : Not at all  Tobacco Use: Low Risk  (04/24/2021)   Patient History    Smoking Tobacco Use: Never    Smokeless Tobacco Use: Never    Passive Exposure: Not on file  Transportation Needs: No Transportation Needs (07/13/2020)   PRAPARE - Transportation    Lack of Transportation (Medical): No    Lack of Transportation (Non-Medical): No    CCM Care Plan  Allergies  Allergen Reactions   Penicillins Rash    Has patient had a PCN reaction causing immediate rash, facial/tongue/throat swelling, SOB or lightheadedness with hypotension: No Has patient had a PCN reaction causing severe rash involving mucus membranes or skin necrosis: No Has patient had a PCN reaction that required hospitalization No Has patient had a PCN reaction occurring within the last 10 years: No If all of the above answers are "NO", then may proceed with Cephalosporin use.     Medications Reviewed Today     Reviewed by [name], FNP (Family Nurse Practitioner) on 04/24/21 at [place] List Status: <None>   Medication Order Taking? Sig Documenting Provider Last Dose Status Informant  acetaminophen (TYLENOL) 500 MG tablet [id number] Yes Take 1 tablet (500 mg total) by mouth every 6 (six) hours as needed. [name], FNP Taking Active   aspirin EC 81 MG tablet [id number] Yes Take 81 mg by mouth daily. Swallow whole. [provider] Taking Active   atorvastatin (LIPITOR) 20 MG tablet [id number] Yes Take 1 tablet (20 mg total) by mouth daily. Glendale Chard, MD Taking Active   Cholecalciferol (VITAMIN D) 50 MCG (2000 UT) tablet [id number] Yes Take 2,000 Units by mouth daily.  [provider] Taking Active Self   glucose blood (ONETOUCH ULTRA) test strip [id number] Yes Use as instructed [name], FNP Taking Active   glucose blood test strip [id number]  Use as instructed to test blood sugars three times a day DX:E11.65 [name], FNP  Active   JARDIANCE 10 MG TABS tablet [id number] Yes TAKE 1 TABLET(10 MG) BY MOUTH DAILY BEFORE AND Evelena Peat, FNP Taking Active   metFORMIN (GLUCOPHAGE) 1000 MG tablet [id number] Yes TAKE 1 TABLET(1000 MG) BY MOUTH TWICE DAILY WITH A MEAL [name], FNP Taking Active   mometasone (NASONEX) 50 MCG/ACT nasal spray [id number] Yes Place 2 sprays into the nose daily. [name], FNP Taking Active   Multiple Vitamin (MULTIVITAMIN WITH MINERALS) TABS tablet [id number] Yes Take 1 tablet by mouth daily. One a Day Men's [provider] Taking Active Self  Olmesartan-amLODIPine-HCTZ 40-10-12.5 MG TABS [id number] Yes Take 1 tablet by mouth daily. [name], FNP Taking Active   tamsulosin [facility]) 0.4 MG CAPS capsule [id number] Yes Take 0.4 mg by mouth daily. [provider] Taking Active            Med Note Valma Cava   Sat Jan 27, 2016 11:57 AM)    traMADol (ULTRAM) 50 MG tablet [id number] Yes Take 1 tablet (50 mg total) by mouth every 12 (twelve) hours as needed. Marybelle Killings, MD Taking Active   valACYclovir (VALTREX) 500 MG tablet [id number] Yes Take 500 mg by mouth daily. [provider] Taking Active             Patient Active Problem List   Diagnosis  Date Noted   Spinal stenosis of lumbar region 02/17/2020   Tingling of skin 03/20/2018   Type 2 diabetes mellitus without complication, without long-term current use of insulin ([place]) 12/19/2017   Essential hypertension 12/19/2017    Immunization History  Administered Date(s) Administered   Fluad Quad(high Dose 65+) 09/30/2018, 10/19/2019   Influenza, High Dose Seasonal PF 10/10/2017   Influenza-Unspecified 11/10/2017, 10/26/2020   PFIZER(Purple Top)SARS-COV-2  Vaccination 02/19/2019, 03/12/2019, 11/13/2019, 12/14/2019, 10/26/2020   Pneumococcal Conjugate-13 09/30/2018   Pneumococcal Polysaccharide-23 10/17/2015   Pneumococcal-Unspecified 11/02/2012, 02/09/2014   Tdap 11/02/2012   Zoster Recombinat (Shingrix) 11/22/2020, 01/25/2021    Conditions to be addressed/monitored:  Hypertension and Diabetes  Care Plan : [place]  Updates made by Mayford Knife, RPH since 07/04/2021 12:00 AM     Problem: DM, HLD, Chronic Pain   Priority: High     Long-Range Goal: Disease Management   Start Date: 07/03/2021  Recent Progress: On track  Priority: High  Note:    Current Barriers:  Unable to independently monitor therapeutic efficacy  Pharmacist Clinical Goal(s):  Patient will achieve adherence to monitoring guidelines and medication adherence to achieve therapeutic efficacy through collaboration with PharmD and provider.   Interventions: 1:1 collaboration with [name], FNP regarding development and update of comprehensive plan of care as evidenced by provider attestation and co-signature Inter-disciplinary care team collaboration (see longitudinal plan of care) Comprehensive medication review performed; medication list updated in electronic medical record  Hypertension (BP goal <130/80) -Controlled -Current treatment: Olmesartan-amlodipine - HCTZ 40-10-12.5 mg tablet once per day Appropriate, Effective, Safe, Accessible -Current home readings: he is checking his BP at home, this morning he had an early start so he did not check it  -Current dietary habits: they do not use any salt in there food.  -Current exercise habits: he is doing exercise on the treadmill and bicycle. He is also going to physical therapy twice per day.  -Denies hypotensive/hypertensive symptoms -Educated on Daily salt intake goal < 2300 mg; Importance of home blood pressure monitoring; Proper BP monitoring technique; -Counseled to monitor BP at home  at least three times per week, document, and provide log at future appointments -Recommended to continue current medication  Diabetes (A1c goal <7%) -Controlled -Current medications: Jardiance 10 mg tablet once per day Appropriate, Effective, Safe, Accessible Metformin 1000 mg tablet taking 1 tablet by mouth twice daily Appropriate, Effective, Safe, Accessible -Current home glucose readings fasting glucose: he had an early start today so he did not check his BS, readings have been normal -Denies hypoglycemic/hyperglycemic symptoms -Current meal patterns:  drinks: he is drinking plenty of water  -Current exercise: he is doing exercise on the treadmill and bicycle. He is also going to physical therapy twice per day.  -Educated on A1c and blood sugar goals; Prevention and management of hypoglycemic episodes; -Counseled to check feet daily and get yearly eye exams -Recommended to continue current medication  Patient Goals/Self-Care Activities Patient will:  - take medications as prescribed as evidenced by patient report and record review  Follow Up Plan: The patient has been provided with contact information for the care management team and has been advised to call with any health related questions or concerns.        Medication Assistance: None required.  Patient affirms current coverage meets needs.  Compliance/Adherence/Medication fill history: Care Gaps: COVID-19 Booster   Star-Rating Drugs: Atorvastatin 20 mg tablet Metformin XR 1000 mg tablet  Olmesartan-amlodipine-HCTZ 40-10-12.5 mg  Patient's preferred pharmacy  is:  [facility] [address] [address], [address] [place] [address] Phone: [phone] Fax: [phone]  [facility] [phone] [place], [place] - [place] [address] Phone: [phone] Fax: [phone]  Uses pill  box? Yes Pt endorses 90% compliance  We discussed: Current pharmacy is preferred with insurance plan and patient is satisfied with pharmacy services Patient decided to: Utilize [facility] for medication synchronization, packaging and delivery  Care Plan and Follow Up Patient Decision:  Patient agrees to Care Plan and Follow-up.  Plan: The patient has been provided with contact information for the care management team and has been advised to call with any health related questions or concerns.   [name], CPP, PharmD Clinical Pharmacist Practitioner [facility] [phone]

## 2021-07-04 DIAGNOSIS — M5451 Vertebrogenic low back pain: Secondary | ICD-10-CM | POA: Diagnosis not present

## 2021-07-04 NOTE — Patient Instructions (Addendum)
Visit Information It was great speaking with you today!  Please let me know if you have any questions about our visit.   Goals Addressed             This Visit's Progress    Monitor and Manage My Blood Sugar-Diabetes Type 2       Timeframe:  Long-Range Goal Priority:  High                   Follow Up Date 11/21/2021  In Progress:   - check blood sugar at prescribed times - check blood sugar if I feel it is too high or too low - take the blood sugar log to all doctor visits    Why is this important?   Checking your blood sugar at home helps to keep it from getting very high or very low.  Writing the results in a diary or log helps the doctor know how to care for you.  Your blood sugar log should have the time, date and the results.  Also, write down the amount of insulin or other medicine that you take.  Other information, like what you ate, exercise done and how you were feeling, will also be helpful.             Patient Care Plan: CCM Pharmacy Care Plan     Problem Identified: DM, HLD, Chronic Pain   Priority: High     Long-Range Goal: Disease Management   Start Date: 07/03/2021  Recent Progress: On track  Priority: High  Note:    Current Barriers:  Unable to independently monitor therapeutic efficacy  Pharmacist Clinical Goal(s):  Patient will achieve adherence to monitoring guidelines and medication adherence to achieve therapeutic efficacy through collaboration with PharmD and provider.   Interventions: 1:1 collaboration with [name], FNP regarding development and update of comprehensive plan of care as evidenced by provider attestation and co-signature Inter-disciplinary care team collaboration (see longitudinal plan of care) Comprehensive medication review performed; medication list updated in electronic medical record  Hypertension (BP goal <130/80) -Controlled -Current treatment: Olmesartan-amlodipine - HCTZ 40-10-12.5 mg tablet once per day  Appropriate, Effective, Safe, Accessible -Current home readings: he is checking his BP at home, this morning he had an early start so he did not check it  -Current dietary habits: they do not use any salt in there food.  -Current exercise habits: he is doing exercise on the treadmill and bicycle. He is also going to physical therapy twice per day.  -Denies hypotensive/hypertensive symptoms -Educated on Daily salt intake goal < 2300 mg; Importance of home blood pressure monitoring; Proper BP monitoring technique; -Counseled to monitor BP at home at least three times per week, document, and provide log at future appointments -Recommended to continue current medication  Diabetes (A1c goal <7%) -Controlled -Current medications: Jardiance 10 mg tablet once per day Appropriate, Effective, Safe, Accessible Metformin 1000 mg tablet taking 1 tablet by mouth twice daily Appropriate, Effective, Safe, Accessible -Current home glucose readings fasting glucose: he had an early start today so he did not check his BS, readings have been normal -Denies hypoglycemic/hyperglycemic symptoms -Current meal patterns:  drinks: he is drinking plenty of water  -Current exercise: he is doing exercise on the treadmill and bicycle. He is also going to physical therapy twice per day.  -Educated on A1c and blood sugar goals; Prevention and management of hypoglycemic episodes; -Counseled to check feet daily and get yearly eye exams -Recommended to continue current medication  Patient Goals/Self-Care Activities Patient will:  - take medications as prescribed as evidenced by patient report and record review  Follow Up Plan: The patient has been provided with contact information for the care management team and has been advised to call with any health related questions or concerns.      Patient agreed to services and verbal consent obtained.   The patient verbalized understanding of instructions, educational  materials, and care plan provided today and agreed to receive a mailed copy of patient instructions, educational materials, and care plan.   Ricky Shields, PharmD Clinical Pharmacist [facility] [phone]

## 2021-07-05 ENCOUNTER — Ambulatory Visit
Admission: RE | Admit: 2021-07-05 | Discharge: 2021-07-05 | Disposition: A | Discharge status: Home / Self Care | Payer: PPO | Source: Ambulatory Visit | Attending: Neurosurgery | Admitting: Neurosurgery

## 2021-07-05 DIAGNOSIS — M545 Low back pain, unspecified: Secondary | ICD-10-CM | POA: Diagnosis not present

## 2021-07-05 DIAGNOSIS — M48062 Spinal stenosis, lumbar region with neurogenic claudication: Secondary | ICD-10-CM

## 2021-07-05 DIAGNOSIS — M4316 Spondylolisthesis, lumbar region: Secondary | ICD-10-CM | POA: Diagnosis not present

## 2021-07-05 DIAGNOSIS — M48061 Spinal stenosis, lumbar region without neurogenic claudication: Secondary | ICD-10-CM | POA: Diagnosis not present

## 2021-07-09 DIAGNOSIS — M5451 Vertebrogenic low back pain: Secondary | ICD-10-CM | POA: Diagnosis not present

## 2021-07-10 DIAGNOSIS — M48062 Spinal stenosis, lumbar region with neurogenic claudication: Secondary | ICD-10-CM | POA: Diagnosis not present

## 2021-07-10 DIAGNOSIS — M5116 Intervertebral disc disorders with radiculopathy, lumbar region: Secondary | ICD-10-CM | POA: Diagnosis not present

## 2021-07-11 DIAGNOSIS — M5451 Vertebrogenic low back pain: Secondary | ICD-10-CM | POA: Diagnosis not present

## 2021-07-12 ENCOUNTER — Other Ambulatory Visit: Payer: Self-pay | Admitting: Neurosurgery

## 2021-07-13 DIAGNOSIS — Z7984 Long term (current) use of oral hypoglycemic drugs: Secondary | ICD-10-CM

## 2021-07-13 DIAGNOSIS — E1159 Type 2 diabetes mellitus with other circulatory complications: Secondary | ICD-10-CM | POA: Diagnosis not present

## 2021-07-13 DIAGNOSIS — I10 Essential (primary) hypertension: Secondary | ICD-10-CM

## 2021-07-18 ENCOUNTER — Ambulatory Visit (INDEPENDENT_AMBULATORY_CARE_PROVIDER_SITE_OTHER): Payer: PPO | Admitting: Nurse Practitioner

## 2021-07-18 ENCOUNTER — Encounter: Payer: Self-pay | Admitting: Nurse Practitioner

## 2021-07-18 VITALS — BP 130/66 | HR 94 | Temp 98.7°F | Ht 69.0 in | Wt 153.0 lb

## 2021-07-18 DIAGNOSIS — I10 Essential (primary) hypertension: Secondary | ICD-10-CM | POA: Diagnosis not present

## 2021-07-18 DIAGNOSIS — R634 Abnormal weight loss: Secondary | ICD-10-CM

## 2021-07-18 DIAGNOSIS — E119 Type 2 diabetes mellitus without complications: Secondary | ICD-10-CM | POA: Diagnosis not present

## 2021-07-18 DIAGNOSIS — E1169 Type 2 diabetes mellitus with other specified complication: Secondary | ICD-10-CM

## 2021-07-18 DIAGNOSIS — Z79899 Other long term (current) drug therapy: Secondary | ICD-10-CM

## 2021-07-18 DIAGNOSIS — Z0000 Encounter for general adult medical examination without abnormal findings: Secondary | ICD-10-CM | POA: Diagnosis not present

## 2021-07-18 DIAGNOSIS — M48061 Spinal stenosis, lumbar region without neurogenic claudication: Secondary | ICD-10-CM

## 2021-07-18 DIAGNOSIS — E782 Mixed hyperlipidemia: Secondary | ICD-10-CM | POA: Diagnosis not present

## 2021-07-18 LAB — POCT URINALYSIS DIPSTICK
Bilirubin, UA: NEGATIVE
Blood, UA: NEGATIVE
Glucose, UA: POSITIVE — AB
Ketones, UA: NEGATIVE
Leukocytes, UA: NEGATIVE
Nitrite, UA: NEGATIVE
Protein, UA: NEGATIVE
Spec Grav, UA: 1.015 (ref 1.010–1.025)
Urobilinogen, UA: 0.2 E.U./dL
pH, UA: 6.5 (ref 5.0–8.0)

## 2021-07-18 NOTE — Progress Notes (Signed)
I,[name],acting as a Education administrator for [employer], FNP.,have documented all relevant documentation on the behalf of [name], FNP,as directed by  [name], FNP while in the presence of [name], [place].  Subjective:     Patient ID: Ricky Shields , male    DOB: [birth year] , 76 y.o.   MRN: [id number]   Chief Complaint  Patient presents with   Annual Exam    HPI  The patient is here today for physical exam. He is followed by Dr [name] as his Urologist.  He has surgery scheduled August 3rd Dr Arnoldo Morale.    Wt Readings from Last 3 Encounters: 07/18/21 : 153 lb (69.4 kg) 04/24/21 : 157 lb 6.4 oz (71.4 kg) 02/21/21 : 157 lb 6.4 oz (71.4 kg)    Diabetes He presents for his follow-up diabetic visit. He has type 2 diabetes mellitus. His disease course has been stable. There are no hypoglycemic associated symptoms. Pertinent negatives for hypoglycemia include no dizziness or headaches. Pertinent negatives for diabetes include no blurred vision, no chest pain, no fatigue, no polydipsia, no polyphagia and no polyuria. There are no hypoglycemic complications. Symptoms are stable. There are no diabetic complications. Risk factors for coronary artery disease include male sex and sedentary lifestyle. Current diabetic treatment includes oral agent (monotherapy). He is compliant with treatment all of the time. His weight is stable. He is following a diabetic diet. When asked about meal planning, he reported none. He has not had a previous visit with a dietitian. He participates in exercise intermittently. An ACE inhibitor/angiotensin II receptor blocker is being taken. He does not see a podiatrist.Eye exam is current (scheduled for tomorrow).  Hypertension This is a chronic problem. The current episode started more than 1 year ago. The problem is unchanged. The problem is controlled. Pertinent negatives include no anxiety, blurred vision, chest pain, headaches or palpitations. There are no  associated agents to hypertension. Risk factors for coronary artery disease include sedentary lifestyle and male gender. Past treatments include angiotensin blockers and diuretics. The current treatment provides no improvement. There are no compliance problems.  There is no history of angina. There is no history of chronic renal disease.     Past Medical History:  Diagnosis Date   Back pain    intermittent-prior military injury   BPH (benign prostatic hypertrophy) with urinary retention    Diabetes mellitus without complication (HCC)    GERD (gastroesophageal reflux disease)    High cholesterol    Hypertension      Family History  Problem Relation Age of Onset   Stroke Mother    Stroke Father      Current Outpatient Medications:    acetaminophen (TYLENOL) 500 MG tablet, Take 1 tablet (500 mg total) by mouth every 6 (six) hours as needed. (Patient taking differently: Take 1,000 mg by mouth every 6 (six) hours as needed for moderate pain.), Disp: 30 tablet, Rfl: 0   aspirin EC 81 MG tablet, Take 81 mg by mouth daily. Swallow whole., Disp: , Rfl:    atorvastatin (LIPITOR) 20 MG tablet, Take 1 tablet (20 mg total) by mouth daily., Disp: 90 tablet, Rfl: 3   cholecalciferol (VITAMIN D3) 25 MCG (1000 UNIT) tablet, Take 1,000 Units by mouth daily., Disp: , Rfl:    empagliflozin (JARDIANCE) 10 MG TABS tablet, TAKE 1 TABLET(10 MG) BY MOUTH DAILY BEFORE AND BREAKFAST, Disp: 30 tablet, Rfl: 5   esomeprazole (NEXIUM) 20 MG capsule, Take 20 mg by mouth daily as needed (  acid reflux)., Disp: , Rfl:    gabapentin (NEURONTIN) 100 MG capsule, Take 100 mg by mouth 3 (three) times daily as needed (pain)., Disp: , Rfl:    glucose blood (ONETOUCH ULTRA) test strip, Use as instructed, Disp: 100 each, Rfl: 2   glucose blood test strip, Use as instructed to test blood sugars three times a day DX:E11.65, Disp: 100 each, Rfl: 12   Lidocaine 4 % GEL, Apply 1 Application topically daily as needed (pain)., Disp: ,  Rfl:    metFORMIN (GLUCOPHAGE) 1000 MG tablet, TAKE 1 TABLET(1000 MG) BY MOUTH TWICE DAILY WITH A MEAL, Disp: 180 tablet, Rfl: 1   Multiple Vitamin (MULTIVITAMIN WITH MINERALS) TABS tablet, Take 1 tablet by mouth daily. One a Day Men's, Disp: , Rfl:    Multiple Vitamins-Minerals (PRESERVISION AREDS 2) CAPS, Take 1 capsule by mouth 2 (two) times daily., Disp: , Rfl:    naloxone (NARCAN) nasal spray 4 mg/0.1 mL, Place 1 spray into the nose as needed (opioid overdose)., Disp: , Rfl:    Olmesartan-amLODIPine-HCTZ 40-10-12.5 MG TABS, Take 1 tablet by mouth daily., Disp: 90 tablet, Rfl: 1   Omega-3 Fatty Acids (FISH OIL) 1200 MG CAPS, Take 1,200 mg by mouth daily., Disp: , Rfl:    tamsulosin (FLOMAX) 0.4 MG CAPS capsule, Take 0.4 mg by mouth daily., Disp: , Rfl: 5   traMADol (ULTRAM) 50 MG tablet, Take 1 tablet (50 mg total) by mouth every 12 (twelve) hours as needed., Disp: 10 tablet, Rfl: 0   valACYclovir (VALTREX) 500 MG tablet, Take 500 mg by mouth daily., Disp: , Rfl:    Allergies  Allergen Reactions   Penicillins Rash    Has patient had a PCN reaction causing immediate rash, facial/tongue/throat swelling, SOB or lightheadedness with hypotension: No Has patient had a PCN reaction causing severe rash involving mucus membranes or skin necrosis: No Has patient had a PCN reaction that required hospitalization No Has patient had a PCN reaction occurring within the last 10 years: No If all of the above answers are "NO", then may proceed with Cephalosporin use.      Men's preventive visit. Patient Health Questionnaire (PHQ-2) is  Cataract Office Visit from 07/18/2021 in [facility]  PHQ-2 Total Score 0      Patient is on a Regular diet. Exercising with PT and will do the treadmill and stationary bikeMarital status: Married. Relevant history for alcohol use is:  Social History   Substance and Sexual Activity  Alcohol Use No   Alcohol/week: 0.0 standard drinks of  alcohol   Relevant history for tobacco use is:  Social History   Tobacco Use  Smoking Status Never  Smokeless Tobacco Never  .   Review of Systems  Constitutional:  Positive for unexpected weight change. Negative for fatigue.       [address] that he is losing too much weight.   HENT: Negative.    Eyes: Negative.  Negative for blurred vision.  Respiratory: Negative.    Cardiovascular: Negative.  Negative for chest pain and palpitations.  Gastrointestinal: Negative.   Endocrine: Negative.  Negative for polydipsia, polyphagia and polyuria.  Genitourinary: Negative.   Musculoskeletal:  Positive for arthralgias.  Skin: Negative.   Allergic/Immunologic: Negative.   Neurological: Negative.  Negative for dizziness and headaches.  Hematological: Negative.   Psychiatric/Behavioral: Negative.       Today's Vitals   07/18/21 1031  BP: 130/66  Pulse: 94  Temp: 98.7 F (37.1 C)  TempSrc: Oral  Weight: 153 lb (69.4 kg)  Height: '5\' 9"'  (1.753 m)  PainSc: 8   PainLoc: Hip   Body mass index is 22.59 kg/m.  Wt Readings from Last 3 Encounters:  08/09/21 154 lb 12.8 oz (70.2 kg)  08/09/21 154 lb (69.9 kg)  08/09/21 154 lb 3.2 oz (69.9 kg)    Objective:  Physical Exam Vitals reviewed.  Constitutional:      General: He is not in acute distress.    Appearance: Normal appearance.  HENT:     Head: Normocephalic and atraumatic.     Right Ear: External ear normal. There is no impacted cerumen (excess cerumen).     Left Ear: External ear normal. There is no impacted cerumen (excess cerumen).     Nose: Nose normal.     Mouth/Throat:     Mouth: Mucous membranes are moist.  Cardiovascular:     Rate and Rhythm: Normal rate and regular rhythm.     Pulses: Normal pulses.     Heart sounds: Normal heart sounds. No murmur heard. Pulmonary:     Effort: Pulmonary effort is normal. No respiratory distress.     Breath sounds: Normal breath sounds. No wheezing.  Abdominal:     General: Abdomen is  flat. Bowel sounds are normal. There is no distension.     Palpations: Abdomen is soft.     Tenderness: There is no abdominal tenderness.  Genitourinary:    Comments: Deferred - sees Urologist Musculoskeletal:        General: Normal range of motion.     Cervical back: Normal range of motion and neck supple.  Skin:    General: Skin is warm.     Capillary Refill: Capillary refill takes less than 2 seconds.     Comments: Bruising to left forearm  Neurological:     General: No focal deficit present.     Mental Status: He is alert and oriented to person, place, and time.     Cranial Nerves: No cranial nerve deficit.     Motor: No weakness.  Psychiatric:        Mood and Affect: Mood normal.        Behavior: Behavior normal.        Thought Content: Thought content normal.        Judgment: Judgment normal.         Assessment And Plan:    1. Encounter for general adult medical examination w/o abnormal findings Behavior modifications discussed and diet history reviewed.   Pt will continue to exercise regularly and modify diet with low GI, plant based foods and decrease intake of processed foods.  Recommend intake of daily multivitamin, Vitamin D, and calcium.  Recommend colonoscopy for preventive screenings, as well as recommend immunizations that include influenza, TDAP, and Shingles (up to date)  2. Type 2 diabetes mellitus without complication, without long-term current use of insulin (HCC) Comments: HgbA1c is improving slightly. Continue current medications.  - EKG 12-Lead - POCT Urinalysis Dipstick (81002) - Microalbumin / Creatinine Urine Ratio - Hemoglobin A1c - CMP14+EGFR  3. Essential hypertension Comments: Blood pressure is controlled, continue current medications.   4. Mixed hyperlipidemia Comments: Continue statin, tolerating well.  - CMP14+EGFR - Lipid panel  5. Abnormal weight loss Comments: Has had an 8 lb weight loss in the last 6 months, denies trying to lose  weight however has been eating healthy foods.  - TSH  6. Spinal stenosis of lumbar region without neurogenic claudication Comments: Scheduled for surgery  on August 3rd  7. Other long term (current) drug therapy - CBC     Patient was given opportunity to ask questions. Patient verbalized understanding of the plan and was able to repeat key elements of the plan. All questions were answered to their satisfaction.   [name], FNP   I, [name], FNP, have reviewed all documentation for this visit. The documentation on 07/18/21 for the exam, diagnosis, procedures, and orders are all accurate and complete.   THE PATIENT IS ENCOURAGED TO PRACTICE SOCIAL DISTANCING DUE TO THE COVID-19 PANDEMIC.

## 2021-07-18 NOTE — Patient Instructions (Signed)
Health Maintenance, Male Adopting a healthy lifestyle and getting preventive care are important in promoting health and wellness. Ask your health care provider about: The right schedule for you to have regular tests and exams. Things you can do on your own to prevent diseases and keep yourself healthy. What should I know about diet, weight, and exercise? Eat a healthy diet  Eat a diet that includes plenty of vegetables, fruits, low-fat dairy products, and lean protein. Do not eat a lot of foods that are high in solid fats, added sugars, or sodium. Maintain a healthy weight Body mass index (BMI) is a measurement that can be used to identify possible weight problems. It estimates body fat based on height and weight. Your health care provider can help determine your BMI and help you achieve or maintain a healthy weight. Get regular exercise Get regular exercise. This is one of the most important things you can do for your health. Most adults should: Exercise for at least 150 minutes each week. The exercise should increase your heart rate and make you sweat (moderate-intensity exercise). Do strengthening exercises at least twice a week. This is in addition to the moderate-intensity exercise. Spend less time sitting. Even light physical activity can be beneficial. Watch cholesterol and blood lipids Have your blood tested for lipids and cholesterol at 75 years of age, then have this test every 5 years. You may need to have your cholesterol levels checked more often if: Your lipid or cholesterol levels are high. You are older than 75 years of age. You are at high risk for heart disease. What should I know about cancer screening? Many types of cancers can be detected early and may often be prevented. Depending on your health history and family history, you may need to have cancer screening at various ages. This may include screening for: Colorectal cancer. Prostate cancer. Skin cancer. Lung  cancer. What should I know about heart disease, diabetes, and high blood pressure? Blood pressure and heart disease High blood pressure causes heart disease and increases the risk of stroke. This is more likely to develop in people who have high blood pressure readings or are overweight. Talk with your health care provider about your target blood pressure readings. Have your blood pressure checked: Every 3-5 years if you are 18-39 years of age. Every year if you are 40 years old or older. If you are between the ages of 65 and 75 and are a current or former smoker, ask your health care provider if you should have a one-time screening for abdominal aortic aneurysm (AAA). Diabetes Have regular diabetes screenings. This checks your fasting blood sugar level. Have the screening done: Once every three years after age 45 if you are at a normal weight and have a low risk for diabetes. More often and at a younger age if you are overweight or have a high risk for diabetes. What should I know about preventing infection? Hepatitis B If you have a higher risk for hepatitis B, you should be screened for this virus. Talk with your health care provider to find out if you are at risk for hepatitis B infection. Hepatitis C Blood testing is recommended for: Everyone born from 1945 through 1965. Anyone with known risk factors for hepatitis C. Sexually transmitted infections (STIs) You should be screened each year for STIs, including gonorrhea and chlamydia, if: You are sexually active and are younger than 75 years of age. You are older than 75 years of age and your   health care provider tells you that you are at risk for this type of infection. Your sexual activity has changed since you were last screened, and you are at increased risk for chlamydia or gonorrhea. Ask your health care provider if you are at risk. Ask your health care provider about whether you are at high risk for HIV. Your health care provider  may recommend a prescription medicine to help prevent HIV infection. If you choose to take medicine to prevent HIV, you should first get tested for HIV. You should then be tested every 3 months for as long as you are taking the medicine. Follow these instructions at home: Alcohol use Do not drink alcohol if your health care provider tells you not to drink. If you drink alcohol: Limit how much you have to 0-2 drinks a day. Know how much alcohol is in your drink. In the [place] one drink equals one 12 oz bottle of beer (355 mL), one 5 oz glass of wine (148 mL), or one 1 oz glass of hard liquor (44 mL). Lifestyle Do not use any products that contain nicotine or tobacco. These products include cigarettes, chewing tobacco, and vaping devices, such as e-cigarettes. If you need help quitting, ask your health care provider. Do not use street drugs. Do not share needles. Ask your health care provider for help if you need support or information about quitting drugs. General instructions Schedule regular health, dental, and eye exams. Stay current with your vaccines. Tell your health care provider if: You often feel depressed. You have ever been abused or do not feel safe at home. Summary Adopting a healthy lifestyle and getting preventive care are important in promoting health and wellness. Follow your health care provider's instructions about healthy diet, exercising, and getting tested or screened for diseases. Follow your health care provider's instructions on monitoring your cholesterol and blood pressure. This information is not intended to replace advice given to you by your health care provider. Make sure you discuss any questions you have with your health care provider. Document Revised: 05/22/2020 Document Reviewed: 05/22/2020 Elsevier Patient Education  2023 [employer]  

## 2021-07-19 DIAGNOSIS — M5451 Vertebrogenic low back pain: Secondary | ICD-10-CM | POA: Diagnosis not present

## 2021-07-19 LAB — CBC
Hematocrit: 44.1 % (ref 37.5–51.0)
Hemoglobin: 15.1 g/dL (ref 13.0–17.7)
MCH: 32.8 pg (ref 26.6–33.0)
MCHC: 34.2 g/dL (ref 31.5–35.7)
MCV: 96 fL (ref 79–97)
Platelets: 279 10*3/uL (ref 150–450)
RBC: 4.61 x10E6/uL (ref 4.14–5.80)
RDW: 12.1 % (ref 11.6–15.4)
WBC: 6.1 10*3/uL (ref 3.4–10.8)

## 2021-07-19 LAB — CMP14+EGFR
ALT: 18 IU/L (ref 0–44)
AST: 19 IU/L (ref 0–40)
Albumin/Globulin Ratio: 1.8 (ref 1.2–2.2)
Albumin: 4.6 g/dL (ref 3.7–4.7)
Alkaline Phosphatase: 83 IU/L (ref 44–121)
BUN/Creatinine Ratio: 13 (ref 10–24)
BUN: 14 mg/dL (ref 8–27)
Bilirubin Total: 0.5 mg/dL (ref 0.0–1.2)
CO2: 22 mmol/L (ref 20–29)
Calcium: 10.1 mg/dL (ref 8.6–10.2)
Chloride: 101 mmol/L (ref 96–106)
Creatinine, Ser: 1.1 mg/dL (ref 0.76–1.27)
Globulin, Total: 2.6 g/dL (ref 1.5–4.5)
Glucose: 131 mg/dL — ABNORMAL HIGH (ref 70–99)
Potassium: 4.3 mmol/L (ref 3.5–5.2)
Sodium: 140 mmol/L (ref 134–144)
Total Protein: 7.2 g/dL (ref 6.0–8.5)
eGFR: 70 mL/min/{1.73_m2} (ref >59)

## 2021-07-19 LAB — LIPID PANEL
Chol/HDL Ratio: 2.6 ratio (ref 0.0–5.0)
Cholesterol, Total: 153 mg/dL (ref 100–199)
HDL: 60 mg/dL (ref >39)
LDL Chol Calc (NIH): 81 mg/dL (ref 0–99)
Triglycerides: 60 mg/dL (ref 0–149)
VLDL Cholesterol Cal: 12 mg/dL (ref 5–40)

## 2021-07-19 LAB — TSH: TSH: 1.51 u[IU]/mL (ref 0.450–4.500)

## 2021-07-19 LAB — HEMOGLOBIN A1C
Est. average glucose Bld gHb Est-mCnc: 151 mg/dL
Hgb A1c MFr Bld: 6.9 % — ABNORMAL HIGH (ref 4.8–5.6)

## 2021-07-19 LAB — MICROALBUMIN / CREATININE URINE RATIO
Creatinine, Urine: 70 mg/dL
Microalb/Creat Ratio: 82 mg/g creat — ABNORMAL HIGH (ref 0–29)
Microalbumin, Urine: 57.6 ug/mL

## 2021-07-23 DIAGNOSIS — M5451 Vertebrogenic low back pain: Secondary | ICD-10-CM | POA: Diagnosis not present

## 2021-07-24 ENCOUNTER — Other Ambulatory Visit: Payer: Self-pay | Admitting: Neurosurgery

## 2021-07-25 DIAGNOSIS — M5451 Vertebrogenic low back pain: Secondary | ICD-10-CM | POA: Diagnosis not present

## 2021-07-30 DIAGNOSIS — M5451 Vertebrogenic low back pain: Secondary | ICD-10-CM | POA: Diagnosis not present

## 2021-07-31 DIAGNOSIS — M79606 Pain in leg, unspecified: Secondary | ICD-10-CM | POA: Diagnosis not present

## 2021-07-31 DIAGNOSIS — G8929 Other chronic pain: Secondary | ICD-10-CM | POA: Diagnosis not present

## 2021-07-31 DIAGNOSIS — M25559 Pain in unspecified hip: Secondary | ICD-10-CM | POA: Diagnosis not present

## 2021-07-31 DIAGNOSIS — M48061 Spinal stenosis, lumbar region without neurogenic claudication: Secondary | ICD-10-CM | POA: Diagnosis not present

## 2021-07-31 DIAGNOSIS — M5136 Other intervertebral disc degeneration, lumbar region: Secondary | ICD-10-CM | POA: Diagnosis not present

## 2021-08-01 ENCOUNTER — Telehealth: Payer: Self-pay

## 2021-08-01 DIAGNOSIS — M5451 Vertebrogenic low back pain: Secondary | ICD-10-CM | POA: Diagnosis not present

## 2021-08-01 NOTE — Chronic Care Management (AMB) (Cosign Needed Addendum)
Care Gap(s) Not Met that Need to be Addressed:   Eye Exam for Patients With Diabetes   Action Taken: Contact patient and next eye exam is on 01-03-2022 at 10:00 with Dr. Venetia Maxon   Follow Up: None needed   Care Gap(s) Not Met that Need to be Addressed:   Kidney Health Evaluation for Patients With Diabetes   Action Taken: Reviewed patient's chart. Last eGFR 07-18-2021 70, Micro/creat ration 07-18-2021 82  Follow Up: None needed   [place] Pharmacist Assistant [phone]

## 2021-08-06 DIAGNOSIS — M5451 Vertebrogenic low back pain: Secondary | ICD-10-CM | POA: Diagnosis not present

## 2021-08-08 DIAGNOSIS — M5451 Vertebrogenic low back pain: Secondary | ICD-10-CM | POA: Diagnosis not present

## 2021-08-08 NOTE — Progress Notes (Signed)
Surgical Instructions    Your procedure is scheduled on Thursday August 3.  Report to [facility] Main Entrance "A" at 1120 A.M., then check in with the Admitting office.  Call this number if you have problems the morning of surgery:  [phone]   If you have any questions prior to your surgery date call [phone]: Open Monday-Friday 8am-4pm    Remember:  Do not eat or drink anything after midnight the night before your surgery    Take these medicines the morning of surgery with A SIP OF WATER:  atorvastatin (LIPITOR) tamsulosin (FLOMAX) valACYclovir (VALTREX)  If needed you may take: acetaminophen (TYLENOL)  esomeprazole (NEXIUM)  gabapentin (NEURONTIN) naloxone (NARCAN)  traMADol (ULTRAM)   As of today, STOP taking any Aspirin (unless otherwise instructed by your surgeon) Aleve, Naproxen, Ibuprofen, Motrin, Advil, Goody's, BC's, all herbal medications, fish oil, and all vitamins.  WHAT DO I DO ABOUT MY DIABETES MEDICATION?  HOLD JARDIANCE FOR 72 HOURS PRIOR TO SURGERY.  LAST DOSE OF JARDIANCE WILL BE SUNDAY JULY 30.  Do not take oral diabetes medicines (pills) the morning of surgery. Do NOT take metFORMIN (GLUCOPHAGE) the morning of surgery.  HOW TO MANAGE YOUR DIABETES BEFORE AND AFTER SURGERY  Why is it important to control my blood sugar before and after surgery? Improving blood sugar levels before and after surgery helps healing and can limit problems. A way of improving blood sugar control is eating a healthy diet by:  Eating less sugar and carbohydrates  Increasing activity/exercise  Talking with your doctor about reaching your blood sugar goals High blood sugars (greater than 180 mg/dL) can raise your risk of infections and slow your recovery, so you will need to focus on controlling your diabetes during the weeks before surgery. Make sure that the doctor who takes care of your diabetes knows about your planned surgery including the date and location.  How  do I manage my blood sugar before surgery? Check your blood sugar at least 4 times a day, starting 2 days before surgery, to make sure that the level is not too high or low.  Check your blood sugar the morning of your surgery when you wake up and every 2 hours until you get to the Short Stay unit.  If your blood sugar is less than 70 mg/dL, you will need to treat for low blood sugar: Do not take insulin. Treat a low blood sugar (less than 70 mg/dL) with  cup of clear juice (cranberry or apple), 4 glucose tablets, OR glucose gel. Recheck blood sugar in 15 minutes after treatment (to make sure it is greater than 70 mg/dL). If your blood sugar is not greater than 70 mg/dL on recheck, call [phone] for further instructions. Report your blood sugar to the short stay nurse when you get to Short Stay.  If you are admitted to the hospital after surgery: Your blood sugar will be checked by the staff and you will probably be given insulin after surgery (instead of oral diabetes medicines) to make sure you have good blood sugar levels. The goal for blood sugar control after surgery is 80-180 mg/dL.          Do not wear jewelry or makeup Do not wear lotions, powders, perfumes/colognes, or deodorant. Do not shave 48 hours prior to surgery.  Men may shave face and neck. Do not bring valuables to the hospital. Do not wear nail polish, gel polish, artificial nails, or any other type of covering on natural nails (  fingers and toes) If you have artificial nails or gel coating that need to be removed by a nail salon, please have this removed prior to surgery. Artificial nails or gel coating may interfere with anesthesia's ability to adequately monitor your vital signs.  [facility] is not responsible for any belongings or valuables. .   Do NOT Smoke (Tobacco/Vaping)  24 hours prior to your procedure  If you use a CPAP at night, you may bring your mask for your overnight stay.   Contacts, glasses, hearing  aids, dentures or partials may not be worn into surgery, please bring cases for these belongings   For patients admitted to the hospital, discharge time will be determined by your treatment team.   Patients discharged the day of surgery will not be allowed to drive home, and someone needs to stay with them for 24 hours.   SURGICAL WAITING ROOM VISITATION Patients having surgery or a procedure may have no more than 2 support people in the waiting area - these visitors may rotate.   Children under the age of 57 must have an adult with them who is not the patient. If the patient needs to stay at the hospital during part of their recovery, the visitor guidelines for inpatient rooms apply. Pre-op nurse will coordinate an appropriate time for 1 support person to accompany patient in pre-op.  This support person may not rotate.   Please refer to the [facility] website for the visitor guidelines for Inpatients (after your surgery is over and you are in a regular room).    Special instructions:    Oral Hygiene is also important to reduce your risk of infection.  Remember - BRUSH YOUR TEETH THE MORNING OF SURGERY WITH YOUR REGULAR TOOTHPASTE   [place]- Preparing For Surgery  Before surgery, you can play an important role. Because skin is not sterile, your skin needs to be as free of germs as possible. You can reduce the number of germs on your skin by washing with CHG (chlorahexidine gluconate) Soap before surgery.  CHG is an antiseptic cleaner which kills germs and bonds with the skin to continue killing germs even after washing.     Please do not use if you have an allergy to CHG or antibacterial soaps. If your skin becomes reddened/irritated stop using the CHG.  Do not shave (including legs and underarms) for at least 48 hours prior to first CHG shower. It is OK to shave your face.  Please follow these instructions carefully.     Shower the NIGHT BEFORE SURGERY and the MORNING OF SURGERY  with CHG Soap.   If you chose to wash your hair, wash your hair first as usual with your normal shampoo. After you shampoo, rinse your hair and body thoroughly to remove the shampoo.  Then [employer] and genitals (private parts) with your normal soap and rinse thoroughly to remove soap.  After that Use CHG Soap as you would any other liquid soap. You can apply CHG directly to the skin and wash gently with a scrungie or a clean washcloth.   Apply the CHG Soap to your body ONLY FROM THE NECK DOWN.  Do not use on open wounds or open sores. Avoid contact with your eyes, ears, mouth and genitals (private parts). Wash Face and genitals (private parts)  with your normal soap.   Wash thoroughly, paying special attention to the area where your surgery will be performed.  Thoroughly rinse your body with warm water from  the neck down.  DO NOT shower/wash with your normal soap after using and rinsing off the CHG Soap.  Pat yourself dry with a CLEAN TOWEL.  Wear CLEAN PAJAMAS to bed the night before surgery  Place CLEAN SHEETS on your bed the night before your surgery  DO NOT SLEEP WITH PETS.   Day of Surgery:  Take a shower with CHG soap. Wear Clean/Comfortable clothing the morning of surgery Do not apply any deodorants/lotions.   Remember to brush your teeth WITH YOUR REGULAR TOOTHPASTE.    If you received a COVID test during your pre-op visit, it is requested that you wear a mask when out in public, stay away from anyone that may not be feeling well, and notify your surgeon if you develop symptoms. If you have been in contact with anyone that has tested positive in the last 10 days, please notify your surgeon.    Please read over the following fact sheets that you were given.

## 2021-08-09 ENCOUNTER — Other Ambulatory Visit: Payer: Self-pay

## 2021-08-09 ENCOUNTER — Encounter (HOSPITAL_COMMUNITY): Payer: Self-pay

## 2021-08-09 ENCOUNTER — Encounter: Payer: PPO | Admitting: Nurse Practitioner

## 2021-08-09 ENCOUNTER — Encounter (HOSPITAL_COMMUNITY)
Admission: RE | Admit: 2021-08-09 | Discharge: 2021-08-09 | Disposition: A | Discharge status: Home / Self Care | Payer: PPO | Source: Ambulatory Visit | Attending: Neurosurgery | Admitting: Neurosurgery

## 2021-08-09 ENCOUNTER — Ambulatory Visit (INDEPENDENT_AMBULATORY_CARE_PROVIDER_SITE_OTHER): Payer: PPO

## 2021-08-09 VITALS — BP 118/64 | HR 98 | Temp 98.2°F | Ht 66.2 in | Wt 154.2 lb

## 2021-08-09 VITALS — BP 133/76 | HR 83 | Temp 98.1°F | Resp 17 | Ht 69.0 in | Wt 154.8 lb

## 2021-08-09 DIAGNOSIS — Z0000 Encounter for general adult medical examination without abnormal findings: Secondary | ICD-10-CM | POA: Diagnosis not present

## 2021-08-09 DIAGNOSIS — E119 Type 2 diabetes mellitus without complications: Secondary | ICD-10-CM | POA: Insufficient documentation

## 2021-08-09 DIAGNOSIS — Z01818 Encounter for other preprocedural examination: Secondary | ICD-10-CM

## 2021-08-09 DIAGNOSIS — Z01812 Encounter for preprocedural laboratory examination: Secondary | ICD-10-CM | POA: Diagnosis not present

## 2021-08-09 LAB — SURGICAL PCR SCREEN
MRSA, PCR: NEGATIVE
Staphylococcus aureus: NEGATIVE

## 2021-08-09 LAB — GLUCOSE, CAPILLARY: Glucose-Capillary: 198 mg/dL — ABNORMAL HIGH (ref 70–99)

## 2021-08-09 NOTE — Progress Notes (Deleted)
[place] Shawntavia Saunders,acting as a Education administrator for [name], FNP.,have documented all relevant documentation on the behalf of [name], FNP,as directed by  [name], FNP while in the presence of [name], [place].    Subjective:     Patient ID: Ricky Shields , male    DOB: Aug 04, 1945 , 76 y.o.   MRN: [id number]   Chief Complaint  Patient presents with   Diabetes    HPI  Patient presents today for DM, patient doesn't have any other concerns today.   Diabetes     Past Medical History:  Diagnosis Date   Back pain    intermittent-prior military injury   BPH (benign prostatic hypertrophy) with urinary retention    Diabetes mellitus without complication (HCC)    GERD (gastroesophageal reflux disease)    High cholesterol    Hypertension      Family History  Problem Relation Age of Onset   Stroke Mother    Stroke Father      Current Outpatient Medications:    acetaminophen (TYLENOL) 500 MG tablet, Take 1 tablet (500 mg total) by mouth every 6 (six) hours as needed. (Patient taking differently: Take 1,000 mg by mouth every 6 (six) hours as needed for moderate pain.), Disp: 30 tablet, Rfl: 0   aspirin EC 81 MG tablet, Take 81 mg by mouth daily. Swallow whole., Disp: , Rfl:    atorvastatin (LIPITOR) 20 MG tablet, Take 1 tablet (20 mg total) by mouth daily., Disp: 90 tablet, Rfl: 3   cholecalciferol (VITAMIN D3) 25 MCG (1000 UNIT) tablet, Take 1,000 Units by mouth daily., Disp: , Rfl:    empagliflozin (JARDIANCE) 10 MG TABS tablet, TAKE 1 TABLET(10 MG) BY MOUTH DAILY BEFORE AND BREAKFAST, Disp: 30 tablet, Rfl: 5   esomeprazole (NEXIUM) 20 MG capsule, Take 20 mg by mouth daily as needed (acid reflux)., Disp: , Rfl:    gabapentin (NEURONTIN) 100 MG capsule, Take 100 mg by mouth 3 (three) times daily as needed (pain)., Disp: , Rfl:    glucose blood (ONETOUCH ULTRA) test strip, Use as instructed, Disp: 100 each, Rfl: 2   glucose blood test strip, Use as instructed to test blood  sugars three times a day DX:E11.65, Disp: 100 each, Rfl: 12   Lidocaine 4 % GEL, Apply 1 Application topically daily as needed (pain)., Disp: , Rfl:    metFORMIN (GLUCOPHAGE) 1000 MG tablet, TAKE 1 TABLET(1000 MG) BY MOUTH TWICE DAILY WITH A MEAL, Disp: 180 tablet, Rfl: 1   Multiple Vitamin (MULTIVITAMIN WITH MINERALS) TABS tablet, Take 1 tablet by mouth daily. One a Day Men's, Disp: , Rfl:    Multiple Vitamins-Minerals (PRESERVISION AREDS 2) CAPS, Take 1 capsule by mouth 2 (two) times daily., Disp: , Rfl:    naloxone (NARCAN) nasal spray 4 mg/0.1 mL, Place 1 spray into the nose as needed (opioid overdose)., Disp: , Rfl:    Olmesartan-amLODIPine-HCTZ 40-10-12.5 MG TABS, Take 1 tablet by mouth daily., Disp: 90 tablet, Rfl: 1   Omega-3 Fatty Acids (FISH OIL) 1200 MG CAPS, Take 1,200 mg by mouth daily., Disp: , Rfl:    tamsulosin (FLOMAX) 0.4 MG CAPS capsule, Take 0.4 mg by mouth daily., Disp: , Rfl: 5   traMADol (ULTRAM) 50 MG tablet, Take 1 tablet (50 mg total) by mouth every 12 (twelve) hours as needed., Disp: 10 tablet, Rfl: 0   valACYclovir (VALTREX) 500 MG tablet, Take 500 mg by mouth daily., Disp: , Rfl:    Allergies  Allergen Reactions  Penicillins Rash    Has patient had a PCN reaction causing immediate rash, facial/tongue/throat swelling, SOB or lightheadedness with hypotension: No Has patient had a PCN reaction causing severe rash involving mucus membranes or skin necrosis: No Has patient had a PCN reaction that required hospitalization No Has patient had a PCN reaction occurring within the last 10 years: No If all of the above answers are "NO", then may proceed with Cephalosporin use.      Review of Systems   There were no vitals filed for this visit. There is no height or weight on file to calculate BMI.   Objective:  Physical Exam      Assessment And Plan:     There are no diagnoses linked to this encounter.    Patient was given opportunity to ask questions. Patient  verbalized understanding of the plan and was able to repeat key elements of the plan. All questions were answered to their satisfaction.  Tonie Griffith, [place], Tonie Griffith, CMA, have reviewed all documentation for this visit. The documentation on 08/09/21 for the exam, diagnosis, procedures, and orders are all accurate and complete.   IF YOU HAVE BEEN REFERRED TO A SPECIALIST, IT MAY TAKE 1-2 WEEKS TO SCHEDULE/PROCESS THE REFERRAL. IF YOU HAVE NOT HEARD FROM US/SPECIALIST IN TWO WEEKS, PLEASE GIVE [place] A CALL AT [phone] X [phone].   THE PATIENT IS ENCOURAGED TO PRACTICE SOCIAL DISTANCING DUE TO THE COVID-19 PANDEMIC.

## 2021-08-09 NOTE — Progress Notes (Signed)
Patient not seen just had a physical on 07/18/21, no new issues. Discussed lab results from 7/5 visit no changes.

## 2021-08-09 NOTE — Progress Notes (Signed)
PCP - [name] FNP Cardiologist - denies  PPM/ICD - denies Device Orders -  Rep Notified -   Chest x-ray - none EKG - 07/18/21 Stress Test - none ECHO - none Cardiac Cath - none  Sleep Study - none CPAP -   Fasting Blood Sugar - 134 Checks Blood Sugar every morning  Blood Thinner Instructions: na Aspirin Instructions: follow your surgeon's instructions  ERAS Protcol -no PRE-SURGERY Ensure or G2-   COVID TEST- n/a   Anesthesia review: no  Patient denies shortness of breath, fever, cough and chest pain at PAT appointment   All instructions explained to the patient, with a verbal understanding of the material. Patient agrees to go over the instructions while at home for a better understanding. Patient also instructed to wear a mask when out in public prior to surgery. The opportunity to ask questions was provided.

## 2021-08-09 NOTE — Progress Notes (Signed)
Subjective:   Ricky Shields is a 76 y.o. male who presents for Medicare Annual/Subsequent preventive examination.  Review of Systems     Cardiac Risk Factors include: advanced age (>25mn, >>64women);diabetes mellitus;hypertension;male gender     Objective:    Today's Vitals   08/09/21 1421 08/09/21 1428  BP: 118/64   Shields: 98   Temp: 98.2 F (36.8 C)   TempSrc: Oral   SpO2: 97%   Weight: 154 lb 3.2 oz (69.9 kg)   Height: 5' 6.2" (1.681 m)   PainSc:  9    Body mass index is 24.74 kg/m.     08/09/2021    2:33 PM 08/09/2021   10:38 AM 07/13/2020   11:05 AM 07/08/2019   11:35 AM 06/25/2018   10:58 AM 01/27/2016   11:18 AM 10/21/2014    7:25 AM  Advanced Directives  Does Patient Have a Medical Advance Directive? No No No Yes No No No  Type of AScientist, research (medical)Living will     [place]?    No - copy requested     Would patient like information on creating a medical advance directive? No - Patient declined No - Patient declined   No - Patient declined  No - patient declined information    Current Medications (verified) Outpatient Encounter Medications as of 08/09/2021  Medication Sig   acetaminophen (TYLENOL) 500 MG tablet Take 1 tablet (500 mg total) by mouth every 6 (six) hours as needed. (Patient taking differently: Take 1,000 mg by mouth every 6 (six) hours as needed for moderate pain.)   aspirin EC 81 MG tablet Take 81 mg by mouth daily. Swallow whole.   atorvastatin (LIPITOR) 20 MG tablet Take 1 tablet (20 mg total) by mouth daily.   cholecalciferol (VITAMIN D3) 25 MCG (1000 UNIT) tablet Take 1,000 Units by mouth daily.   empagliflozin (JARDIANCE) 10 MG TABS tablet TAKE 1 TABLET(10 MG) BY MOUTH DAILY BEFORE AND BREAKFAST   esomeprazole (NEXIUM) 20 MG capsule Take 20 mg by mouth daily as needed (acid reflux).   gabapentin (NEURONTIN) 100 MG capsule Take 100 mg by mouth 3 (three) times daily as needed  (pain).   glucose blood (ONETOUCH ULTRA) test strip Use as instructed   glucose blood test strip Use as instructed to test blood sugars three times a day DX:E11.65   Lidocaine 4 % GEL Apply 1 Application topically daily as needed (pain).   metFORMIN (GLUCOPHAGE) 1000 MG tablet TAKE 1 TABLET(1000 MG) BY MOUTH TWICE DAILY WITH A MEAL   Multiple Vitamin (MULTIVITAMIN WITH MINERALS) TABS tablet Take 1 tablet by mouth daily. One a Day Men's   Multiple Vitamins-Minerals (PRESERVISION AREDS 2) CAPS Take 1 capsule by mouth 2 (two) times daily.   naloxone (NARCAN) nasal spray 4 mg/0.1 mL Place 1 spray into the nose as needed (opioid overdose).   Olmesartan-amLODIPine-HCTZ 40-10-12.5 MG TABS Take 1 tablet by mouth daily.   Omega-3 Fatty Acids (FISH OIL) 1200 MG CAPS Take 1,200 mg by mouth daily.   tamsulosin (FLOMAX) 0.4 MG CAPS capsule Take 0.4 mg by mouth daily.   traMADol (ULTRAM) 50 MG tablet Take 1 tablet (50 mg total) by mouth every 12 (twelve) hours as needed.   valACYclovir (VALTREX) 500 MG tablet Take 500 mg by mouth daily.   No facility-administered encounter medications on file as of 08/09/2021.    Allergies (verified) Penicillins   History: Past Medical  History:  Diagnosis Date   Back pain    intermittent-prior military injury   BPH (benign prostatic hypertrophy) with urinary retention    Diabetes mellitus without complication (HCC)    GERD (gastroesophageal reflux disease)    High cholesterol    Hypertension    Past Surgical History:  Procedure Laterality Date   APPENDECTOMY     COLONOSCOPY     GREEN LIGHT LASER TURP (TRANSURETHRAL RESECTION OF PROSTATE N/A 10/21/2014   Procedure: GREEN LIGHT LASER TURP (TRANSURETHRAL RESECTION OF PROSTATE;  Surgeon: Festus Aloe, MD;  Location: [facility];  Service: Urology;  Laterality: N/A;   Family History  Problem Relation Age of Onset   Stroke Mother    Stroke Father    Social History   Socioeconomic History    Marital status: Married    Spouse name: Not on file   Number of children: Not on file   Years of education: Not on file   Highest education level: Not on file  Occupational History   Occupation: retired  Tobacco Use   Smoking status: Never   Smokeless tobacco: Never  Vaping Use   Vaping Use: Never used  Substance and Sexual Activity   Alcohol use: No    Alcohol/week: 0.0 standard drinks of alcohol   Drug use: No   Sexual activity: Yes  Other Topics Concern   Not on file  Social History Narrative   Not on file   Social Determinants of Health   Financial Resource Strain: Low Risk  (08/09/2021)   Overall Financial Resource Strain (CARDIA)    Difficulty of Paying Living Expenses: Not hard at all  Food Insecurity: No Food Insecurity (08/09/2021)   Hunger Vital Sign    Worried About Running Out of Food in the Last Year: Never true    [place] in the Last Year: Never true  Transportation Needs: No Transportation Needs (08/09/2021)   PRAPARE - Hydrologist (Medical): No    Lack of Transportation (Non-Medical): No  Physical Activity: Insufficiently Active (08/09/2021)   Exercise Vital Sign    Days of Exercise per Week: 3 days    Minutes of Exercise per Session: 30 min  Stress: No Stress Concern Present (08/09/2021)   [place]    Feeling of Stress : Not at all  Social Connections: Socially Integrated (12/23/2019)   Social Connection and Isolation Panel [NHANES]    Frequency of Communication with Friends and Family: More than three times a week    Frequency of Social Gatherings with Friends and Family: More than three times a week    Attends Religious Services: More than 4 times per year    Active Member of [employer] or Organizations: Yes    Attends Music therapist: More than 4 times per year    Marital Status: Married    Tobacco Counseling Counseling given: Not  Answered   Clinical Intake:  Pre-visit preparation completed: Yes  Pain : 0-10 Pain Score: 9  Pain Type: Chronic pain Pain Location: Hip Pain Orientation: Right Pain Descriptors / Indicators: Aching Pain Onset: More than a month ago Pain Frequency: Constant     Nutritional Status: BMI of 19-24  Normal Nutritional Risks: None Diabetes: Yes  How often do you need to have someone help you when you read instructions, pamphlets, or other written materials from your doctor or pharmacy?: 1 - Never What is the last grade  level you completed in school?: 12th grade  Diabetic? Yes Nutrition Risk Assessment:  Has the patient had any N/V/D within the last 2 months?  No  Does the patient have any non-healing wounds?  No  Has the patient had any unintentional weight loss or weight gain?  Yes   Diabetes:  Is the patient diabetic?  Yes  If diabetic, was a CBG obtained today?  No  Did the patient bring in their glucometer from home?  No  How often do you monitor your CBG's? daily.   Financial Strains and Diabetes Management:  Are you having any financial strains with the device, your supplies or your medication? No .  Does the patient want to be seen by Chronic Care Management for management of their diabetes?  No  Would the patient like to be referred to a Nutritionist or for Diabetic Management?  No   Diabetic Exams:  Diabetic Eye Exam: Completed 01/02/2021 Diabetic Foot Exam: Completed 02/21/2021  Interpreter Needed?: No  Information entered by :: NAllen LPN   Activities of Daily Living    08/09/2021    2:34 PM 08/09/2021   10:44 AM  In your present state of health, do you have any difficulty performing the following activities:  Hearing? 0   Vision? 0   Difficulty concentrating or making decisions? 0   Walking or climbing stairs? 1   Dressing or bathing? 0   Doing errands, shopping? 0 0  Preparing Food and eating ? N   Using the Toilet? N   In the past six months,  have you accidently leaked urine? N   Do you have problems with loss of bowel control? N   Managing your Medications? N   Managing your Finances? N   Housekeeping or managing your Housekeeping? N     Patient Care Team: [name], FNP as PCP - General (General Practice) Mayford Knife, [facility] (Pharmacist)  Indicate any recent Medical Services you may have received from other than [facility] providers in the past year (date may be approximate).     Assessment:   This is a routine wellness examination for Orlyn.  Hearing/Vision screen Vision Screening - Comments:: Regular eye exams, Dr. Venetia Maxon  Dietary issues and exercise activities discussed: Current Exercise Habits: Home exercise routine, Time (Minutes): 30, Frequency (Times/Week): 3, Weekly Exercise (Minutes/Week): 90   Goals Addressed             This Visit's Progress    Patient Stated       08/09/2021, wants to get through surgery       Depression Screen    08/09/2021    2:34 PM 07/18/2021   10:31 AM 07/13/2020   11:06 AM 07/12/2020    9:53 AM 07/08/2019   11:36 AM 09/30/2018   11:21 AM 06/25/2018   10:59 AM  PHQ 2/9 Scores  PHQ - 2 Score 0 0 0 0 0 0 0  PHQ- 9 Score    0 0  0    Fall Risk    08/09/2021    2:34 PM 07/18/2021   10:31 AM 07/13/2020   11:06 AM 07/12/2020    9:53 AM 07/08/2019   11:36 AM  [place] in the past year? 0 0 0 0 0  Number falls in past yr: 0 0  0   Injury with Fall? 0 0  0   Risk for fall due to : Impaired mobility;Medication side effect No Fall Risks Medication side effect  Medication side effect  Follow up Falls evaluation completed;Education provided;Falls prevention discussed Falls evaluation completed Falls evaluation completed;Education provided;Falls prevention discussed  Falls evaluation completed;Education provided;Falls prevention discussed    FALL RISK PREVENTION PERTAINING TO THE HOME:  Any stairs in or around the home? Yes  If so, are there any without handrails? Yes   Home free of loose throw rugs in walkways, pet beds, electrical cords, etc? Yes  Adequate lighting in your home to reduce risk of falls? Yes   ASSISTIVE DEVICES UTILIZED TO PREVENT FALLS:  Life alert? No  Use of a cane, walker or w/c? No  Grab bars in the bathroom? No  Shower chair or bench in shower? No  Elevated toilet seat or a handicapped toilet? No   TIMED UP AND GO:  Was the test performed? No .     Gait slow and steady without use of assistive device  Cognitive Function:        08/09/2021    2:35 PM 07/13/2020   11:07 AM 07/08/2019   11:37 AM 06/25/2018   11:00 AM  6CIT Screen  What Year? 0 points 0 points 0 points 0 points  What month? 0 points 0 points 0 points 0 points  What time? 0 points 0 points 0 points 0 points  Count back from 20 0 points 0 points 0 points 0 points  Months in reverse 2 points 4 points 0 points 0 points  Repeat phrase 6 points 6 points 4 points 2 points  Total Score 8 points 10 points 4 points 2 points    Immunizations Immunization History  Administered Date(s) Administered   Fluad Quad(high Dose 65+) 09/30/2018, 10/19/2019   Influenza, High Dose Seasonal PF 10/10/2017   Influenza-Unspecified 11/10/2017, 10/26/2020   PFIZER(Purple Top)SARS-COV-2 Vaccination 02/19/2019, 03/12/2019, 11/13/2019, 12/14/2019, 10/26/2020   Pfizer Covid-19 Vaccine Bivalent Booster 34yr & up 07/11/2021   Pneumococcal Conjugate-13 09/30/2018   Pneumococcal Polysaccharide-23 10/17/2015   Pneumococcal-Unspecified 11/02/2012, 02/09/2014   Tdap 11/02/2012   Zoster Recombinat (Shingrix) 11/22/2020, 01/25/2021    TDAP status: Up to date  Flu Vaccine status: Up to date  Pneumococcal vaccine status: Up to date  Covid-19 vaccine status: Completed vaccines  Qualifies for Shingles Vaccine? Yes   Zostavax completed Yes   Shingrix Completed?: Yes  Screening Tests Health Maintenance  Topic Date Due   INFLUENZA VACCINE  08/14/2021   OPHTHALMOLOGY EXAM   01/02/2022   HEMOGLOBIN A1C  01/18/2022   FOOT EXAM  02/21/2022   TETANUS/TDAP  11/03/2022   COLONOSCOPY (Pts 45-432yrInsurance coverage will need to be confirmed)  03/05/2024   Pneumonia Vaccine 6532Years old  Completed   COVID-19 Vaccine  Completed   Hepatitis C Screening  Completed   Zoster Vaccines- Shingrix  Completed   HPV VACCINES  Aged Out    Health Maintenance  There are no preventive care reminders to display for this patient.  Colorectal cancer screening: Type of screening: Colonoscopy. Completed 03/05/2014. Repeat every 10 years  Lung Cancer Screening: (Low Dose CT Chest recommended if Age 75-80ears, 30 pack-year currently smoking OR have quit w/in 15years.) does not qualify.   Lung Cancer Screening Referral: no  Additional Screening:  Hepatitis C Screening: does qualify; Completed 06/12/2016  Vision Screening: Recommended annual ophthalmology exams for early detection of glaucoma and other disorders of the eye. Is the patient up to date with their annual eye exam?  Yes  Who is the provider or what is the name of the office in which  the patient attends annual eye exams? Dr. Venetia Maxon If pt is not established with a provider, would they like to be referred to a provider to establish care? No .   Dental Screening: Recommended annual dental exams for proper oral hygiene  Community Resource Referral / Chronic Care Management: CRR required this visit?  No   CCM required this visit?  No      Plan:     I have personally reviewed and noted the following in the patient's chart:   Medical and social history Use of alcohol, tobacco or illicit drugs  Current medications and supplements including opioid prescriptions. Patient is not currently taking opioid prescriptions. Functional ability and status Nutritional status Physical activity Advanced directives List of other physicians Hospitalizations, surgeries, and ER visits in previous 12 months Vitals Screenings  to include cognitive, depression, and falls Referrals and appointments  In addition, I have reviewed and discussed with patient certain preventive protocols, quality metrics, and best practice recommendations. A written personalized care plan for preventive services as well as general preventive health recommendations were provided to patient.     Kellie Simmering, LPN   [id number]   Nurse Notes: none

## 2021-08-09 NOTE — Patient Instructions (Signed)

## 2021-08-09 NOTE — Patient Instructions (Signed)
Mr. Ricky Shields , Thank you for taking time to come for your Medicare Wellness Visit. I appreciate your ongoing commitment to your health goals. Please review the following plan we discussed and let me know if I can assist you in the future.   Screening recommendations/referrals: Colonoscopy: completed 03/05/2014, due 03/05/2024 Recommended yearly ophthalmology/optometry visit for glaucoma screening and checkup Recommended yearly dental visit for hygiene and checkup  Vaccinations: Influenza vaccine: due 08/14/2021 Pneumococcal vaccine: completed 09/30/2018 Tdap vaccine: completed 11/02/2012, due 11/03/2022 Shingles vaccine: completed   Covid-19:  07/11/2021, 10/26/2020, 12/14/2019, 11/13/2019, 03/12/2019, 02/19/2019  Advanced directives: Advance directive discussed with you today. Even though you declined this today please call our office should you change your mind and we can give you the proper paperwork for you to fill out.  Conditions/risks identified: none  Next appointment: Follow up in one year for your annual wellness visit.   Preventive Care 75 Years and Older, Male Preventive care refers to lifestyle choices and visits with your health care provider that can promote health and wellness. What does preventive care include? A yearly physical exam. This is also called an annual well check. Dental exams once or twice a year. Routine eye exams. Ask your health care provider how often you should have your eyes checked. Personal lifestyle choices, including: Daily care of your teeth and gums. Regular physical activity. Eating a healthy diet. Avoiding tobacco and drug use. Limiting alcohol use. Practicing safe sex. Taking low doses of aspirin every day. Taking vitamin and mineral supplements as recommended by your health care provider. What happens during an annual well check? The services and screenings done by your health care provider during your annual well check will depend on your age,  overall health, lifestyle risk factors, and family history of disease. Counseling  Your health care provider may ask you questions about your: Alcohol use. Tobacco use. Drug use. Emotional well-being. Home and relationship well-being. Sexual activity. Eating habits. History of falls. Memory and ability to understand (cognition). Work and work Statistician. Screening  You may have the following tests or measurements: Height, weight, and BMI. Blood pressure. Lipid and cholesterol levels. These may be checked every 5 years, or more frequently if you are over 67 years old. Skin check. Lung cancer screening. You may have this screening every year starting at age 75 if you have a 30-pack-year history of smoking and currently smoke or have quit within the past 15 years. Fecal occult blood test (FOBT) of the stool. You may have this test every year starting at age 75. Flexible sigmoidoscopy or colonoscopy. You may have a sigmoidoscopy every 5 years or a colonoscopy every 10 years starting at age 75. Prostate cancer screening. Recommendations will vary depending on your family history and other risks. Hepatitis C blood test. Hepatitis B blood test. Sexually transmitted disease (STD) testing. Diabetes screening. This is done by checking your blood sugar (glucose) after you have not eaten for a while (fasting). You may have this done every 1-3 years. Abdominal aortic aneurysm (AAA) screening. You may need this if you are a current or former smoker. Osteoporosis. You may be screened starting at age 75 if you are at high risk. Talk with your health care provider about your test results, treatment options, and if necessary, the need for more tests. Vaccines  Your health care provider may recommend certain vaccines, such as: Influenza vaccine. This is recommended every year. Tetanus, diphtheria, and acellular pertussis (Tdap, Td) vaccine. You may need a Td booster  every 10 years. Zoster vaccine.  You may need this after age 8. Pneumococcal 13-valent conjugate (PCV13) vaccine. One dose is recommended after age 75. Pneumococcal polysaccharide (PPSV23) vaccine. One dose is recommended after age 75. Talk to your health care provider about which screenings and vaccines you need and how often you need them. This information is not intended to replace advice given to you by your health care provider. Make sure you discuss any questions you have with your health care provider. Document Released: 01/27/2015 Document Revised: 09/20/2015 Document Reviewed: 11/01/2014 Elsevier Interactive Patient Education  2017 [place] Prevention in the Home Falls can cause injuries. They can happen to people of all ages. There are many things you can do to make your home safe and to help prevent falls. What can I do on the outside of my home? Regularly fix the edges of walkways and driveways and fix any cracks. Remove anything that might make you trip as you walk through a door, such as a raised step or threshold. Trim any bushes or trees on the path to your home. Use bright outdoor lighting. Clear any walking paths of anything that might make someone trip, such as rocks or tools. Regularly check to see if handrails are loose or broken. Make sure that both sides of any steps have handrails. Any raised decks and porches should have guardrails on the edges. Have any leaves, snow, or ice cleared regularly. Use sand or salt on walking paths during winter. Clean up any spills in your garage right away. This includes oil or grease spills. What can I do in the bathroom? Use night lights. Install grab bars by the toilet and in the tub and shower. Do not use towel bars as grab bars. Use non-skid mats or decals in the tub or shower. If you need to sit down in the shower, use a plastic, non-slip stool. Keep the floor dry. Clean up any water that spills on the floor as soon as it happens. Remove soap  buildup in the tub or shower regularly. Attach bath mats securely with double-sided non-slip rug tape. Do not have throw rugs and other things on the floor that can make you trip. What can I do in the bedroom? Use night lights. Make sure that you have a light by your bed that is easy to reach. Do not use any sheets or blankets that are too big for your bed. They should not hang down onto the floor. Have a firm chair that has side arms. You can use this for support while you get dressed. Do not have throw rugs and other things on the floor that can make you trip. What can I do in the kitchen? Clean up any spills right away. Avoid walking on wet floors. Keep items that you use a lot in easy-to-reach places. If you need to reach something above you, use a strong step stool that has a grab bar. Keep electrical cords out of the way. Do not use floor polish or wax that makes floors slippery. If you must use wax, use non-skid floor wax. Do not have throw rugs and other things on the floor that can make you trip. What can I do with my stairs? Do not leave any items on the stairs. Make sure that there are handrails on both sides of the stairs and use them. Fix handrails that are broken or loose. Make sure that handrails are as long as the stairways. Check any carpeting to  make sure that it is firmly attached to the stairs. Fix any carpet that is loose or worn. Avoid having throw rugs at the top or bottom of the stairs. If you do have throw rugs, attach them to the floor with carpet tape. Make sure that you have a light switch at the top of the stairs and the bottom of the stairs. If you do not have them, ask someone to add them for you. What else can I do to help prevent falls? Wear shoes that: Do not have high heels. Have rubber bottoms. Are comfortable and fit you well. Are closed at the toe. Do not wear sandals. If you use a stepladder: Make sure that it is fully opened. Do not climb a closed  stepladder. Make sure that both sides of the stepladder are locked into place. Ask someone to hold it for you, if possible. Clearly mark and make sure that you can see: Any grab bars or handrails. First and last steps. Where the edge of each step is. Use tools that help you move around (mobility aids) if they are needed. These include: Canes. Walkers. Scooters. Crutches. Turn on the lights when you go into a dark area. Replace any light bulbs as soon as they burn out. Set up your furniture so you have a clear path. Avoid moving your furniture around. If any of your floors are uneven, fix them. If there are any pets around you, be aware of where they are. Review your medicines with your doctor. Some medicines can make you feel dizzy. This can increase your chance of falling. Ask your doctor what other things that you can do to help prevent falls. This information is not intended to replace advice given to you by your health care provider. Make sure you discuss any questions you have with your health care provider. Document Released: 10/27/2008 Document Revised: 06/08/2015 Document Reviewed: 02/04/2014 Elsevier Interactive Patient Education  2017 [employer].

## 2021-08-16 ENCOUNTER — Other Ambulatory Visit: Payer: Self-pay

## 2021-08-16 ENCOUNTER — Encounter (HOSPITAL_COMMUNITY)
Admission: RE | Disposition: A | Discharge status: Home / Self Care | Payer: Self-pay | Source: Home / Self Care | Attending: Neurosurgery

## 2021-08-16 ENCOUNTER — Ambulatory Visit (HOSPITAL_COMMUNITY)
Admission: RE | Admit: 2021-08-16 | Discharge: 2021-08-17 | Disposition: A | Discharge status: Home / Self Care | Payer: PPO | Attending: Neurosurgery | Admitting: Neurosurgery

## 2021-08-16 ENCOUNTER — Encounter (HOSPITAL_COMMUNITY): Payer: Self-pay | Admitting: Neurosurgery

## 2021-08-16 ENCOUNTER — Ambulatory Visit (HOSPITAL_COMMUNITY): Payer: PPO

## 2021-08-16 ENCOUNTER — Ambulatory Visit (HOSPITAL_BASED_OUTPATIENT_CLINIC_OR_DEPARTMENT_OTHER): Payer: PPO

## 2021-08-16 DIAGNOSIS — K219 Gastro-esophageal reflux disease without esophagitis: Secondary | ICD-10-CM | POA: Diagnosis not present

## 2021-08-16 DIAGNOSIS — I10 Essential (primary) hypertension: Secondary | ICD-10-CM

## 2021-08-16 DIAGNOSIS — M48062 Spinal stenosis, lumbar region with neurogenic claudication: Secondary | ICD-10-CM | POA: Diagnosis not present

## 2021-08-16 DIAGNOSIS — Z79899 Other long term (current) drug therapy: Secondary | ICD-10-CM | POA: Insufficient documentation

## 2021-08-16 DIAGNOSIS — E119 Type 2 diabetes mellitus without complications: Secondary | ICD-10-CM | POA: Diagnosis not present

## 2021-08-16 DIAGNOSIS — M5116 Intervertebral disc disorders with radiculopathy, lumbar region: Secondary | ICD-10-CM | POA: Diagnosis not present

## 2021-08-16 DIAGNOSIS — M47816 Spondylosis without myelopathy or radiculopathy, lumbar region: Secondary | ICD-10-CM | POA: Diagnosis not present

## 2021-08-16 DIAGNOSIS — Z7984 Long term (current) use of oral hypoglycemic drugs: Secondary | ICD-10-CM | POA: Insufficient documentation

## 2021-08-16 HISTORY — PX: LUMBAR LAMINECTOMY/DECOMPRESSION MICRODISCECTOMY: SHX5026

## 2021-08-16 LAB — GLUCOSE, CAPILLARY
Glucose-Capillary: 126 mg/dL — ABNORMAL HIGH (ref 70–99)
Glucose-Capillary: 120 mg/dL — ABNORMAL HIGH (ref 70–99)
Glucose-Capillary: 135 mg/dL — ABNORMAL HIGH (ref 70–99)
Glucose-Capillary: 216 mg/dL — ABNORMAL HIGH (ref 70–99)

## 2021-08-16 SURGERY — LUMBAR LAMINECTOMY/DECOMPRESSION MICRODISCECTOMY 2 LEVELS
Anesthesia: General | Laterality: Bilateral

## 2021-08-16 MED ORDER — DOCUSATE SODIUM 100 MG PO CAPS
100.0000 mg | ORAL_CAPSULE | Freq: Two times a day (BID) | ORAL | Status: DC
  Administered 2021-08-16: 100 mg via ORAL
  Filled 2021-08-16: qty 1

## 2021-08-16 MED ORDER — VALACYCLOVIR HCL 500 MG PO TABS: 500.0000 mg | ORAL_TABLET | Freq: Every day | ORAL | Status: DC

## 2021-08-16 MED ORDER — ROCURONIUM BROMIDE 10 MG/ML (PF) SYRINGE
PREFILLED_SYRINGE | INTRAVENOUS | Status: DC | PRN
  Administered 2021-08-16: 60 mg via INTRAVENOUS
  Administered 2021-08-16: 20 mg via INTRAVENOUS

## 2021-08-16 MED ORDER — TAMSULOSIN HCL 0.4 MG PO CAPS: 0.4000 mg | ORAL_CAPSULE | Freq: Every day | ORAL | Status: DC

## 2021-08-16 MED ORDER — OXYCODONE HCL 5 MG PO TABS
10.0000 mg | ORAL_TABLET | ORAL | Status: DC | PRN
  Administered 2021-08-16 – 2021-08-17 (×4): 10 mg via ORAL
  Filled 2021-08-16 (×4): qty 2

## 2021-08-16 MED ORDER — ONDANSETRON HCL 4 MG/2ML IJ SOLN: 4.0000 mg | Freq: Four times a day (QID) | INTRAMUSCULAR | Status: DC | PRN

## 2021-08-16 MED ORDER — PHENOL 1.4 % MT LIQD: 1.0000 | OROMUCOSAL | Status: DC | PRN

## 2021-08-16 MED ORDER — SODIUM CHLORIDE 0.9% FLUSH
3.0000 mL | Freq: Two times a day (BID) | INTRAVENOUS | Status: DC
Start: 2021-08-16 — End: 2021-08-17

## 2021-08-16 MED ORDER — OLMESARTAN-AMLODIPINE-HCTZ 40-10-12.5 MG PO TABS: 1.0000 | ORAL_TABLET | Freq: Every day | ORAL | Status: DC

## 2021-08-16 MED ORDER — BACITRACIN ZINC 500 UNIT/GM EX OINT
TOPICAL_OINTMENT | CUTANEOUS | Status: AC
  Filled 2021-08-16: qty 28.35

## 2021-08-16 MED ORDER — FENTANYL CITRATE (PF) 100 MCG/2ML IJ SOLN
25.0000 ug | INTRAMUSCULAR | Status: DC | PRN
  Administered 2021-08-16 (×3): 50 ug via INTRAVENOUS

## 2021-08-16 MED ORDER — CHLORHEXIDINE GLUCONATE 0.12 % MT SOLN
OROMUCOSAL | Status: AC
  Administered 2021-08-16: 15 mL via OROMUCOSAL
  Filled 2021-08-16: qty 15

## 2021-08-16 MED ORDER — EMPAGLIFLOZIN 10 MG PO TABS
10.0000 mg | ORAL_TABLET | Freq: Every day | ORAL | Status: DC
  Administered 2021-08-16: 10 mg via ORAL
  Filled 2021-08-16: qty 1

## 2021-08-16 MED ORDER — BUPIVACAINE-EPINEPHRINE 0.5% -1:200000 IJ SOLN
INTRAMUSCULAR | Status: AC
  Filled 2021-08-16: qty 1

## 2021-08-16 MED ORDER — VANCOMYCIN HCL IN DEXTROSE 1-5 GM/200ML-% IV SOLN
1000.0000 mg | Freq: Once | INTRAVENOUS | Status: AC
Start: 2021-08-17 — End: 2021-08-16
  Administered 2021-08-16: 1000 mg via INTRAVENOUS
  Filled 2021-08-16: qty 200

## 2021-08-16 MED ORDER — ONDANSETRON HCL 4 MG/2ML IJ SOLN
INTRAMUSCULAR | Status: DC | PRN
  Administered 2021-08-16: 4 mg via INTRAVENOUS

## 2021-08-16 MED ORDER — SUGAMMADEX SODIUM 200 MG/2ML IV SOLN
INTRAVENOUS | Status: DC | PRN
  Administered 2021-08-16: 100 mg via INTRAVENOUS
  Administered 2021-08-16: 200 mg via INTRAVENOUS

## 2021-08-16 MED ORDER — PHENYLEPHRINE 80 MCG/ML (10ML) SYRINGE FOR IV PUSH (FOR BLOOD PRESSURE SUPPORT)
PREFILLED_SYRINGE | INTRAVENOUS | Status: AC
  Filled 2021-08-16: qty 20

## 2021-08-16 MED ORDER — CHLORHEXIDINE GLUCONATE CLOTH 2 % EX PADS: 6.0000 | MEDICATED_PAD | Freq: Once | CUTANEOUS | Status: DC

## 2021-08-16 MED ORDER — ONDANSETRON HCL 4 MG PO TABS: 4.0000 mg | ORAL_TABLET | Freq: Four times a day (QID) | ORAL | Status: DC | PRN

## 2021-08-16 MED ORDER — PROPOFOL 10 MG/ML IV BOLUS
INTRAVENOUS | Status: DC | PRN
  Administered 2021-08-16: 180 mg via INTRAVENOUS

## 2021-08-16 MED ORDER — CYCLOBENZAPRINE HCL 10 MG PO TABS
10.0000 mg | ORAL_TABLET | Freq: Three times a day (TID) | ORAL | Status: DC | PRN
  Administered 2021-08-16 – 2021-08-17 (×2): 10 mg via ORAL
  Filled 2021-08-16 (×2): qty 1

## 2021-08-16 MED ORDER — MORPHINE SULFATE (PF) 4 MG/ML IV SOLN
4.0000 mg | INTRAVENOUS | Status: DC | PRN
  Administered 2021-08-16: 4 mg via INTRAVENOUS
  Filled 2021-08-16: qty 1

## 2021-08-16 MED ORDER — AMLODIPINE BESYLATE 5 MG PO TABS
10.0000 mg | ORAL_TABLET | Freq: Every day | ORAL | Status: DC
  Administered 2021-08-16: 10 mg via ORAL
  Filled 2021-08-16: qty 2

## 2021-08-16 MED ORDER — ACETAMINOPHEN 10 MG/ML IV SOLN
INTRAVENOUS | Status: AC
  Filled 2021-08-16: qty 100

## 2021-08-16 MED ORDER — SODIUM CHLORIDE 0.9% FLUSH
3.0000 mL | INTRAVENOUS | Status: DC | PRN
Start: 2021-08-16 — End: 2021-08-17

## 2021-08-16 MED ORDER — FENTANYL CITRATE (PF) 100 MCG/2ML IJ SOLN
INTRAMUSCULAR | Status: DC | PRN
  Administered 2021-08-16: 25 ug via INTRAVENOUS
  Administered 2021-08-16: 50 ug via INTRAVENOUS
  Administered 2021-08-16: 100 ug via INTRAVENOUS

## 2021-08-16 MED ORDER — LIDOCAINE 2% (20 MG/ML) 5 ML SYRINGE
INTRAMUSCULAR | Status: DC | PRN
  Administered 2021-08-16: 40 mg via INTRAVENOUS

## 2021-08-16 MED ORDER — TRAMADOL HCL 50 MG PO TABS: 50.0000 mg | ORAL_TABLET | Freq: Four times a day (QID) | ORAL | Status: DC | PRN

## 2021-08-16 MED ORDER — GABAPENTIN 100 MG PO CAPS: 100.0000 mg | ORAL_CAPSULE | Freq: Three times a day (TID) | ORAL | Status: DC | PRN

## 2021-08-16 MED ORDER — ONDANSETRON HCL 4 MG/2ML IJ SOLN
INTRAMUSCULAR | Status: AC
  Filled 2021-08-16: qty 2

## 2021-08-16 MED ORDER — ROCURONIUM BROMIDE 10 MG/ML (PF) SYRINGE
PREFILLED_SYRINGE | INTRAVENOUS | Status: AC
  Filled 2021-08-16: qty 10

## 2021-08-16 MED ORDER — AMISULPRIDE (ANTIEMETIC) 5 MG/2ML IV SOLN: 10.0000 mg | Freq: Once | INTRAVENOUS | Status: DC | PRN

## 2021-08-16 MED ORDER — ACETAMINOPHEN 650 MG RE SUPP: 650.0000 mg | RECTAL | Status: DC | PRN

## 2021-08-16 MED ORDER — ACETAMINOPHEN 10 MG/ML IV SOLN
1000.0000 mg | Freq: Once | INTRAVENOUS | Status: DC | PRN
  Administered 2021-08-16: 1000 mg via INTRAVENOUS

## 2021-08-16 MED ORDER — ATORVASTATIN CALCIUM 10 MG PO TABS: 20.0000 mg | ORAL_TABLET | Freq: Every day | ORAL | Status: DC

## 2021-08-16 MED ORDER — DEXAMETHASONE SODIUM PHOSPHATE 10 MG/ML IJ SOLN
INTRAMUSCULAR | Status: DC | PRN
  Administered 2021-08-16: 5 mg via INTRAVENOUS

## 2021-08-16 MED ORDER — OXYCODONE HCL 5 MG PO TABS: 5.0000 mg | ORAL_TABLET | ORAL | Status: DC | PRN

## 2021-08-16 MED ORDER — PROPOFOL 10 MG/ML IV BOLUS
INTRAVENOUS | Status: AC
  Filled 2021-08-16: qty 20

## 2021-08-16 MED ORDER — PHENYLEPHRINE HCL-NACL 20-0.9 MG/250ML-% IV SOLN
INTRAVENOUS | Status: DC | PRN
  Administered 2021-08-16: 25 ug/min via INTRAVENOUS

## 2021-08-16 MED ORDER — FENTANYL CITRATE (PF) 250 MCG/5ML IJ SOLN
INTRAMUSCULAR | Status: AC
  Filled 2021-08-16: qty 5

## 2021-08-16 MED ORDER — BUPIVACAINE-EPINEPHRINE (PF) 0.5% -1:200000 IJ SOLN
INTRAMUSCULAR | Status: DC | PRN
  Administered 2021-08-16: 10 mL via PERINEURAL

## 2021-08-16 MED ORDER — SODIUM CHLORIDE 0.9 % IV SOLN
250.0000 mL | INTRAVENOUS | Status: DC
Start: 2021-08-16 — End: 2021-08-17

## 2021-08-16 MED ORDER — THROMBIN 5000 UNITS EX SOLR
OROMUCOSAL | Status: DC | PRN
  Administered 2021-08-16: 5 mL via TOPICAL

## 2021-08-16 MED ORDER — ACETAMINOPHEN 500 MG PO TABS
1000.0000 mg | ORAL_TABLET | Freq: Four times a day (QID) | ORAL | Status: DC
  Administered 2021-08-16 – 2021-08-17 (×2): 1000 mg via ORAL
  Filled 2021-08-16 (×2): qty 2

## 2021-08-16 MED ORDER — FENTANYL CITRATE (PF) 100 MCG/2ML IJ SOLN
INTRAMUSCULAR | Status: AC
  Filled 2021-08-16: qty 2

## 2021-08-16 MED ORDER — HYDROCHLOROTHIAZIDE 12.5 MG PO TABS
12.5000 mg | ORAL_TABLET | Freq: Every day | ORAL | Status: DC
  Filled 2021-08-16: qty 1

## 2021-08-16 MED ORDER — LIDOCAINE 2% (20 MG/ML) 5 ML SYRINGE
INTRAMUSCULAR | Status: AC
Start: 2021-08-16 — End: ?
  Filled 2021-08-16: qty 5

## 2021-08-16 MED ORDER — VANCOMYCIN HCL IN DEXTROSE 1-5 GM/200ML-% IV SOLN
1000.0000 mg | INTRAVENOUS | Status: AC
  Administered 2021-08-16: 1000 mg via INTRAVENOUS
  Filled 2021-08-16: qty 200

## 2021-08-16 MED ORDER — INSULIN ASPART 100 UNIT/ML IJ SOLN: 0.0000 [IU] | Freq: Three times a day (TID) | INTRAMUSCULAR | Status: DC

## 2021-08-16 MED ORDER — CHLORHEXIDINE GLUCONATE 0.12 % MT SOLN: 15.0000 mL | Freq: Once | OROMUCOSAL | Status: AC

## 2021-08-16 MED ORDER — 0.9 % SODIUM CHLORIDE (POUR BTL) OPTIME
TOPICAL | Status: DC | PRN
  Administered 2021-08-16: 1000 mL

## 2021-08-16 MED ORDER — ONDANSETRON HCL 4 MG/2ML IJ SOLN: 4.0000 mg | Freq: Once | INTRAMUSCULAR | Status: DC | PRN

## 2021-08-16 MED ORDER — INSULIN ASPART 100 UNIT/ML IJ SOLN: 0.0000 [IU] | INTRAMUSCULAR | Status: DC | PRN

## 2021-08-16 MED ORDER — MENTHOL 3 MG MT LOZG: 1.0000 | LOZENGE | OROMUCOSAL | Status: DC | PRN

## 2021-08-16 MED ORDER — ACETAMINOPHEN 325 MG PO TABS: 650.0000 mg | ORAL_TABLET | ORAL | Status: DC | PRN

## 2021-08-16 MED ORDER — PHENYLEPHRINE 80 MCG/ML (10ML) SYRINGE FOR IV PUSH (FOR BLOOD PRESSURE SUPPORT)
PREFILLED_SYRINGE | INTRAVENOUS | Status: DC | PRN
  Administered 2021-08-16: 80 ug via INTRAVENOUS
  Administered 2021-08-16 (×3): 160 ug via INTRAVENOUS
  Administered 2021-08-16: 240 ug via INTRAVENOUS

## 2021-08-16 MED ORDER — METFORMIN HCL 500 MG PO TABS
1000.0000 mg | ORAL_TABLET | Freq: Two times a day (BID) | ORAL | Status: DC
Start: 2021-08-17 — End: 2021-08-17
  Administered 2021-08-17 (×2): 1000 mg via ORAL
  Filled 2021-08-16: qty 2

## 2021-08-16 MED ORDER — BISACODYL 10 MG RE SUPP: 10.0000 mg | Freq: Every day | RECTAL | Status: DC | PRN

## 2021-08-16 MED ORDER — LACTATED RINGERS IV SOLN: INTRAVENOUS | Status: DC

## 2021-08-16 MED ORDER — DEXAMETHASONE SODIUM PHOSPHATE 10 MG/ML IJ SOLN
INTRAMUSCULAR | Status: AC
  Filled 2021-08-16: qty 1

## 2021-08-16 MED ORDER — PANTOPRAZOLE SODIUM 40 MG PO TBEC
40.0000 mg | DELAYED_RELEASE_TABLET | Freq: Every day | ORAL | Status: DC
  Administered 2021-08-16: 40 mg via ORAL
  Filled 2021-08-16: qty 1

## 2021-08-16 MED ORDER — THROMBIN 5000 UNITS EX SOLR
CUTANEOUS | Status: AC
  Filled 2021-08-16: qty 10000

## 2021-08-16 MED ORDER — IRBESARTAN 150 MG PO TABS
300.0000 mg | ORAL_TABLET | Freq: Every day | ORAL | Status: DC
  Administered 2021-08-16: 300 mg via ORAL
  Filled 2021-08-16: qty 2

## 2021-08-16 SURGICAL SUPPLY — 49 items
APL SKNCLS STERI-STRIP NONHPOA (GAUZE/BANDAGES/DRESSINGS) ×2
BAG COUNTER SPONGE SURGICOUNT (BAG) ×2 IMPLANT
BAG SPNG CNTER NS LX DISP (BAG) ×1
BAND INSRT 18 STRL LF DISP RB (MISCELLANEOUS) ×2
BAND RUBBER #18 3X1/16 STRL (MISCELLANEOUS) ×4 IMPLANT
BENZOIN TINCTURE PRP APPL 2/3 (GAUZE/BANDAGES/DRESSINGS) ×3 IMPLANT
BLADE CLIPPER SURG (BLADE) IMPLANT
BUR MATCHSTICK NEURO 3.0 LAGG (BURR) ×2 IMPLANT
BUR PRECISION FLUTE 6.0 (BURR) ×2 IMPLANT
CANISTER SUCT 3000ML PPV (MISCELLANEOUS) ×2 IMPLANT
CARTRIDGE OIL MAESTRO DRILL (MISCELLANEOUS) ×1 IMPLANT
CLSR STERI-STRIP ANTIMIC 1/2X4 (GAUZE/BANDAGES/DRESSINGS) ×1 IMPLANT
DIFFUSER DRILL AIR PNEUMATIC (MISCELLANEOUS) ×2 IMPLANT
DRAPE LAPAROTOMY 100X72X124 (DRAPES) ×2 IMPLANT
DRAPE MICROSCOPE LEICA (MISCELLANEOUS) ×2 IMPLANT
DRAPE SURG 17X23 STRL (DRAPES) ×8 IMPLANT
DRSG OPSITE POSTOP 4X6 (GAUZE/BANDAGES/DRESSINGS) ×2 IMPLANT
ELECT BLADE 4.0 EZ CLEAN MEGAD (MISCELLANEOUS) ×2
ELECT REM PT RETURN 9FT ADLT (ELECTROSURGICAL) ×2
ELECTRODE BLDE 4.0 EZ CLN MEGD (MISCELLANEOUS) ×1 IMPLANT
ELECTRODE REM PT RTRN 9FT ADLT (ELECTROSURGICAL) ×1 IMPLANT
GAUZE 4X4 16PLY ~~LOC~~+RFID DBL (SPONGE) IMPLANT
GAUZE SPONGE 4X4 12PLY STRL (GAUZE/BANDAGES/DRESSINGS) ×2 IMPLANT
GLOVE BIO SURGEON STRL SZ8 (GLOVE) ×2 IMPLANT
GLOVE BIO SURGEON STRL SZ8.5 (GLOVE) ×2 IMPLANT
GLOVE EXAM NITRILE XL STR (GLOVE) IMPLANT
GOWN STRL REUS W/ TWL LRG LVL3 (GOWN DISPOSABLE) IMPLANT
GOWN STRL REUS W/ TWL XL LVL3 (GOWN DISPOSABLE) ×1 IMPLANT
GOWN STRL REUS W/TWL 2XL LVL3 (GOWN DISPOSABLE) IMPLANT
GOWN STRL REUS W/TWL LRG LVL3 (GOWN DISPOSABLE)
GOWN STRL REUS W/TWL XL LVL3 (GOWN DISPOSABLE) ×2
KIT BASIN OR (CUSTOM PROCEDURE TRAY) ×2 IMPLANT
KIT TURNOVER KIT B (KITS) ×2 IMPLANT
NDL HYPO 21X1.5 SAFETY (NEEDLE) IMPLANT
NEEDLE HYPO 21X1.5 SAFETY (NEEDLE) IMPLANT
NEEDLE HYPO 22GX1.5 SAFETY (NEEDLE) ×2 IMPLANT
NS IRRIG 1000ML POUR BTL (IV SOLUTION) ×2 IMPLANT
OIL CARTRIDGE MAESTRO DRILL (MISCELLANEOUS) ×2
PACK LAMINECTOMY NEURO (CUSTOM PROCEDURE TRAY) ×2 IMPLANT
PAD ARMBOARD 7.5X6 YLW CONV (MISCELLANEOUS) ×6 IMPLANT
PATTIES SURGICAL .5 X1 (DISPOSABLE) IMPLANT
SPONGE SURGIFOAM ABS GEL SZ50 (HEMOSTASIS) ×2 IMPLANT
STRIP CLOSURE SKIN 1/2X4 (GAUZE/BANDAGES/DRESSINGS) ×2 IMPLANT
SUT VIC AB 1 CT1 18XBRD ANBCTR (SUTURE) ×2 IMPLANT
SUT VIC AB 1 CT1 8-18 (SUTURE) ×4
SUT VIC AB 2-0 CP2 18 (SUTURE) ×4 IMPLANT
TOWEL GREEN STERILE (TOWEL DISPOSABLE) ×2 IMPLANT
TOWEL GREEN STERILE FF (TOWEL DISPOSABLE) ×2 IMPLANT
WATER STERILE IRR 1000ML POUR (IV SOLUTION) ×2 IMPLANT

## 2021-08-16 NOTE — Progress Notes (Signed)
Pharmacy Antibiotic Note  KENDARRIUS TANZI is a 76 y.o. male admitted on 08/16/2021 with surgical prophylaxis.  Patient has history of penicillin allergy. Pharmacy has been consulted for vancomycin dosing for 1 dose post-op. Patient does not have a drain.   Plan: Vancomycin 1000 mg IV once 08/16/21 0030 for surgical prophylaxis  Height: '5\' 6"'$  (167.6 cm) Weight: 69 kg (152 lb 1.9 oz) IBW/kg (Calculated) : 63.8  Temp (24hrs), Avg:98 F (36.7 C), Min:97.8 F (36.6 C), Max:98.1 F (36.7 C)   Allergies  Allergen Reactions   Penicillins Rash    Has patient had a PCN reaction causing immediate rash, facial/tongue/throat swelling, SOB or lightheadedness with hypotension: No Has patient had a PCN reaction causing severe rash involving mucus membranes or skin necrosis: No Has patient had a PCN reaction that required hospitalization No Has patient had a PCN reaction occurring within the last 10 years: No If all of the above answers are "NO", then may proceed with Cephalosporin use.     Antimicrobials this admission: Vancomycin x1 8/3  Thank you for allowing pharmacy to be a part of this patient's care.  Jeneen Rinks 09/15/5054 9:79 PM

## 2021-08-16 NOTE — H&P (Signed)
Subjective: The patient is a 76 year old black male who has complained of back and right greater left leg pain consistent with neurogenic claudication/lumbar radiculopathy.  He has failed medical management and was worked up with lumbar x-rays and lumbar MRI which demonstrated the patient had spinal stenosis at L3-4 and L4-5 and a possible herniated disc.  I discussed the various treatment options with him.  He has decided proceed with surgery.  Past Medical History:  Diagnosis Date   Back pain    intermittent-prior military injury   BPH (benign prostatic hypertrophy) with urinary retention    Diabetes mellitus without complication (HCC)    GERD (gastroesophageal reflux disease)    High cholesterol    Hypertension     Past Surgical History:  Procedure Laterality Date   APPENDECTOMY     COLONOSCOPY     GREEN LIGHT LASER TURP (TRANSURETHRAL RESECTION OF PROSTATE N/A 10/21/2014   Procedure: GREEN LIGHT LASER TURP (TRANSURETHRAL RESECTION OF PROSTATE;  Surgeon: Festus Aloe, MD;  Location: [facility];  Service: Urology;  Laterality: N/A;    Allergies  Allergen Reactions   Penicillins Rash    Has patient had a PCN reaction causing immediate rash, facial/tongue/throat swelling, SOB or lightheadedness with hypotension: No Has patient had a PCN reaction causing severe rash involving mucus membranes or skin necrosis: No Has patient had a PCN reaction that required hospitalization No Has patient had a PCN reaction occurring within the last 10 years: No If all of the above answers are "NO", then may proceed with Cephalosporin use.     Social History   Tobacco Use   Smoking status: Never   Smokeless tobacco: Never  Substance Use Topics   Alcohol use: No    Alcohol/week: 0.0 standard drinks of alcohol    Family History  Problem Relation Age of Onset   Stroke Mother    Stroke Father    Prior to Admission medications   Medication Sig Start Date End Date Taking?  Authorizing Provider  acetaminophen (TYLENOL) 500 MG tablet Take 1 tablet (500 mg total) by mouth every 6 (six) hours as needed. Patient taking differently: Take 1,000 mg by mouth every 6 (six) hours as needed for moderate pain. 10/14/19  Yes [name], FNP  aspirin EC 81 MG tablet Take 81 mg by mouth daily. Swallow whole.   Yes [provider]  atorvastatin (LIPITOR) 20 MG tablet Take 1 tablet (20 mg total) by mouth daily. 12/26/20  Yes Glendale Chard, MD  cholecalciferol (VITAMIN D3) 25 MCG (1000 UNIT) tablet Take 1,000 Units by mouth daily.   Yes [provider]  empagliflozin (JARDIANCE) 10 MG TABS tablet TAKE 1 TABLET(10 MG) BY MOUTH DAILY BEFORE AND BREAKFAST 05/15/21  Yes [name], FNP  gabapentin (NEURONTIN) 100 MG capsule Take 100 mg by mouth 3 (three) times daily as needed (pain). 07/31/21  Yes [provider]  glucose blood (ONETOUCH ULTRA) test strip Use as instructed 01/10/21  Yes [name], FNP  glucose blood test strip Use as instructed to test blood sugars three times a day DX:E11.65 10/25/19  Yes [name], FNP  Lidocaine 4 % GEL Apply 1 Application topically daily as needed (pain).   Yes [provider]  metFORMIN (GLUCOPHAGE) 1000 MG tablet TAKE 1 TABLET(1000 MG) BY MOUTH TWICE DAILY WITH A MEAL 06/19/21  Yes [name], FNP  Multiple Vitamin (MULTIVITAMIN WITH MINERALS) TABS tablet Take 1 tablet by mouth daily. One a Day Men's   Yes [provider]  Multiple Vitamins-Minerals (PRESERVISION AREDS 2) CAPS Take 1 capsule by mouth 2 (two) times daily.   Yes [provider]  naloxone (NARCAN) nasal spray 4 mg/0.1 mL Place 1 spray into the nose as needed (opioid overdose).   Yes [provider]  Olmesartan-amLODIPine-HCTZ 40-10-12.5 MG TABS Take 1 tablet by mouth daily. 06/07/21  Yes [name], FNP  Omega-3 Fatty Acids (FISH OIL) 1200 MG CAPS Take 1,200 mg by mouth daily.   Yes [provider]   tamsulosin (FLOMAX) 0.4 MG CAPS capsule Take 0.4 mg by mouth daily. 11/20/15  Yes [provider]  traMADol (ULTRAM) 50 MG tablet Take 1 tablet (50 mg total) by mouth every 12 (twelve) hours as needed. 01/30/21  Yes Marybelle Killings, MD  valACYclovir (VALTREX) 500 MG tablet Take 500 mg by mouth daily.   Yes [provider]  esomeprazole (NEXIUM) 20 MG capsule Take 20 mg by mouth daily as needed (acid reflux).    [provider]     Review of Systems  Positive ROS: As above  All other systems have been reviewed and were otherwise negative with the exception of those mentioned in the HPI and as above.  Objective: Vital signs in last 24 hours: Temp:  [98.1 F (36.7 C)] 98.1 F (36.7 C) (08/03 1126) Pulse Rate:  [87] 87 (08/03 1126) Resp:  [18] 18 (08/03 1126) BP: (153)/(79) 153/79 (08/03 1126) SpO2:  [98 %] 98 % (08/03 1126) Weight:  [69 kg] 69 kg (08/03 1126) Estimated body mass index is 24.55 kg/m as calculated from the following:   Height as of this encounter: '5\' 6"'$  (1.676 m).   Weight as of this encounter: 69 kg.   General Appearance: Alert Head: Normocephalic, without obvious abnormality, atraumatic Eyes: PERRL, conjunctiva/corneas clear, EOM's intact,    Ears: Normal  Throat: Normal  Neck: Supple, Back: unremarkable Lungs: Clear to auscultation bilaterally, respirations unlabored Heart: Regular rate and rhythm, no murmur, rub or gallop Abdomen: Soft, non-tender Extremities: Extremities normal, atraumatic, no cyanosis or edema Skin: unremarkable  NEUROLOGIC:   Mental status: alert and oriented,Motor Exam - grossly normal Sensory Exam - grossly normal Reflexes:  Coordination - grossly normal Gait - grossly normal Balance - grossly normal Cranial Nerves: I: smell Not tested  II: visual acuity  OS: Normal  OD: Normal   II: visual fields Full to confrontation  II: pupils Equal, round, reactive to light  III,VII: ptosis None  III,IV,VI:  extraocular muscles  Full ROM  V: mastication Normal  V: facial light touch sensation  Normal  V,VII: corneal reflex  Present  VII: facial muscle function - upper  Normal  VII: facial muscle function - lower Normal  VIII: hearing Not tested  IX: soft palate elevation  Normal  IX,X: gag reflex Present  XI: trapezius strength  5/5  XI: sternocleidomastoid strength 5/5  XI: neck flexion strength  5/5  XII: tongue strength  Normal    Data Review Lab Results  Component Value Date   WBC 6.1 07/18/2021   HGB 15.1 07/18/2021   HCT 44.1 07/18/2021   MCV 96 07/18/2021   PLT 279 07/18/2021   Lab Results  Component Value Date   NA 140 07/18/2021   K 4.3 07/18/2021   CL 101 07/18/2021   CO2 22 07/18/2021   BUN 14 07/18/2021   CREATININE 1.10 07/18/2021   GLUCOSE 131 (H) 07/18/2021   No results found for: "INR", "PROTIME"  Assessment/Plan: L3-4 and L4-5 spinal stenosis,  hernia disc, lumbago, lumbar radiculopathy, neurogenic claudication: I have discussed the situation with the patient.  I reviewed his imaging studies with him and pointed out the abnormalities.  We have discussed the various treatment options including surgery.  I have described the surgical treatment option of an L3-4 and L4-5 bilateral laminotomies/foraminotomies and possible discectomy.  I had the shown him surgical models.  I have given the a surgical pamphlet.  We have discussed the risk, benefits, alternatives, expected postoperative course, and likelihood of achieving our goals with surgery.  I have answered all his questions.  He has decided proceed with surgery.   Ophelia Charter 08/16/2021 1:37 PM

## 2021-08-16 NOTE — Anesthesia Preprocedure Evaluation (Addendum)
Anesthesia Evaluation  Patient identified by MRN, date of birth, ID band Patient awake    Reviewed: Allergy & Precautions, NPO status , Patient's Chart, lab work & pertinent test results  Airway Mallampati: II  TM Distance: >3 FB Neck ROM: Full    Dental  (+) Missing   Pulmonary neg pulmonary ROS,    Pulmonary exam normal        Cardiovascular hypertension, Pt. on medications Normal cardiovascular exam     Neuro/Psych negative neurological ROS  negative psych ROS   GI/Hepatic Neg liver ROS, GERD  Medicated and Controlled,  Endo/Other  diabetes, Oral Hypoglycemic Agents  Renal/GU negative Renal ROS     Musculoskeletal   Abdominal   Peds  Hematology negative hematology ROS (+)   Anesthesia Other Findings LUMBAR STENOSIS WITH NEUROGENIC CLAUDICATION  Reproductive/Obstetrics                            Anesthesia Physical Anesthesia Plan  ASA: 3  Anesthesia Plan: General   Post-op Pain Management:    Induction: Intravenous  PONV Risk Score and Plan: 2 and Ondansetron, Dexamethasone and Treatment may vary due to age or medical condition  Airway Management Planned: Oral ETT  Additional Equipment:   Intra-op Plan:   Post-operative Plan: Extubation in OR  Informed Consent: I have reviewed the patients History and Physical, chart, labs and discussed the procedure including the risks, benefits and alternatives for the proposed anesthesia with the patient or authorized representative who has indicated his/her understanding and acceptance.     Dental advisory given  Plan Discussed with: CRNA  Anesthesia Plan Comments:        Anesthesia Quick Evaluation

## 2021-08-16 NOTE — Anesthesia Postprocedure Evaluation (Signed)
Anesthesia Post Note  Patient: Ricky Shields [facility]  Procedure(s) Performed: BILATERAL LUMBAR THREE-FOUR, LUMBAR FOUR-FIVE LAMINECTOMY /FORAMINOTMY; POSS DISKECTOMY (Bilateral)     Patient location during evaluation: PACU Anesthesia Type: General Level of consciousness: awake Pain management: pain level controlled Vital Signs Assessment: post-procedure vital signs reviewed and stable Respiratory status: spontaneous breathing, nonlabored ventilation, respiratory function stable and patient connected to nasal cannula oxygen Cardiovascular status: blood pressure returned to baseline and stable Postop Assessment: no apparent nausea or vomiting Anesthetic complications: no   No notable events documented.  Last Vitals:  Vitals:   08/16/21 1806 08/16/21 2035  BP: (!) 147/91 (!) 117/57  Pulse: 100 89  Resp: 18 18  Temp: 36.6 C 37 C  SpO2: 99% 96%    Last Pain:  Vitals:   08/16/21 2008  TempSrc:   PainSc: 6                  Ricky Shields P Katty Fretwell

## 2021-08-16 NOTE — Transfer of Care (Signed)
Immediate Anesthesia Transfer of Care Note  Patient: Ricky Shields [facility]  Procedure(s) Performed: BILATERAL LUMBAR THREE-FOUR, LUMBAR FOUR-FIVE LAMINECTOMY Dayna Barker; POSS DISKECTOMY (Bilateral)  Patient Location: PACU  Anesthesia Type:General  Level of Consciousness: awake and oriented  Airway & Oxygen Therapy: Patient Spontanous Breathing  Post-op Assessment: Report given to RN and Patient moving all extremities X 4  Post vital signs: Reviewed and stable  Last Vitals:  Vitals Value Taken Time  BP 146/90 08/16/21 1650  Temp    Pulse 97 08/16/21 1652  Resp 14 08/16/21 1652  SpO2 95 % 08/16/21 1652  Vitals shown include unvalidated device data.  Last Pain:  Vitals:   08/16/21 1126  TempSrc: Oral  PainSc: 8          Complications: No notable events documented.

## 2021-08-16 NOTE — Anesthesia Procedure Notes (Signed)
Procedure Name: Intubation Date/Time: 08/16/2021 2:27 PM  Performed by: Barrington Ellison, CRNAPre-anesthesia Checklist: Patient identified, Emergency Drugs available, Suction available and Patient being monitored Patient Re-evaluated:Patient Re-evaluated prior to induction Oxygen Delivery Method: Circle System Utilized Preoxygenation: Pre-oxygenation with 100% oxygen Induction Type: IV induction Ventilation: Mask ventilation without difficulty Laryngoscope Size: Mac and 4 Grade View: Grade II Tube type: Oral Tube size: 7.5 mm Number of attempts: 1 Airway Equipment and Method: Stylet and Oral airway Placement Confirmation: ETT inserted through vocal cords under direct vision, positive ETCO2 and breath sounds checked- equal and bilateral Secured at: 22 cm Tube secured with: Tape Dental Injury: Teeth and Oropharynx as per pre-operative assessment

## 2021-08-16 NOTE — Op Note (Signed)
Brief history: The patient is a 76 year old black male who is complained of back and bilateral leg pain consistent with lumbar radiculopathy/neurogenic claudication.  He failed medical management and was worked up with a lumbar MRI which demonstrated lumbar spinal stenosis, herniated disc, etc.  I discussed the various treatment options with him.  He has decided proceed with surgery.  Preoperative diagnosis: L3-4 and L4-5 spinal stenosis, L3-4 herniated disc, lumbago, lumbar radiculopathy, neurogenic claudication  Postoperative diagnosis: The same  Procedure: Bilateral L3-4 and L4-5 laminotomy/foraminotomies and right L3-4 intervertebral discectomy using micro-dissection  Surgeon: Dr. Earle Gell  Asst.: None  Anesthesia: Gen. endotracheal  Estimated blood loss: 100 cc  Drains: None  Complications: None  Description of procedure: The patient was brought to the operating room by the anesthesia team. General endotracheal anesthesia was induced. The patient was turned to the prone position on the Wilson frame. The patient's lumbosacral region was then prepared with Betadine scrub and Betadine solution. Sterile drapes were applied.  I then injected the area to be incised with Marcaine with epinephrine solution. I then used a scalpel to make a linear midline incision over the L3-4 and L4-5 intervertebral disc space. I then used electrocautery to perform a right sided subperiosteal dissection exposing the spinous process and lamina of L3-4 and L4-5. We obtained intraoperative radiograph to confirm our location. I then inserted the [facility] retractor for exposure.  We then brought the operative microscope into the field. Under its magnification and illumination we completed the microdissection. I used a high-speed drill to perform a laminotomy at L3-4 and L4-5 on the right. I then used a Kerrison punches to widen the laminotomy and removed the ligamentum flavum at L3-4 and L4-5 on the right.  We  performed foraminotomies about the right L4 and L5 nerve root.  We then drilled across the midline exposing the left L3-4 and L4-5 lamina and ligamentum flavum.  I removed it with a Kerrison punch and performed foraminotomies about the left L4 and L5 nerve root we then used microdissection to free up the thecal sac and the right L4 nerve root from the epidural tissue.  We then using the nerve root retractor to gently retract the thecal sac and the right L4 nerve root medially. This exposed the intervertebral disc. We identified the ruptured disc and remove it with the pituitary forceps.  We did not perform an intervertebral discectomy.  I then palpated along the ventral surface of the thecal sac and along exit route of the bilateral L4 and L5 nerve root and noted that the neural structures were well decompressed. This completed the decompression.  We then obtained hemostasis using bipolar electrocautery. We irrigated the wound out with bacitracin solution. We then removed the retractor. We then reapproximated the patient's thoracolumbar fascia with interrupted #1 Vicryl suture. We then reapproximated the patient's subcutaneous tissue with interrupted 2-0 Vicryl suture. We then reapproximated patient's skin with Steri-Strips and benzoin. The was then coated with bacitracin ointment. The drapes were removed. The patient was subsequently returned to the supine position where they were extubated by the anesthesia team. The patient was then transported to the postanesthesia care unit in stable condition. All sponge instrument and needle counts were reportedly correct at the end of this case.

## 2021-08-17 ENCOUNTER — Encounter (HOSPITAL_COMMUNITY): Payer: Self-pay | Admitting: Neurosurgery

## 2021-08-17 DIAGNOSIS — M5116 Intervertebral disc disorders with radiculopathy, lumbar region: Secondary | ICD-10-CM | POA: Diagnosis not present

## 2021-08-17 LAB — GLUCOSE, CAPILLARY: Glucose-Capillary: 98 mg/dL (ref 70–99)

## 2021-08-17 MED ORDER — OXYCODONE-ACETAMINOPHEN 5-325 MG PO TABS: 1.0000 | ORAL_TABLET | ORAL | 0 refills | Status: DC | PRN

## 2021-08-17 MED ORDER — DOCUSATE SODIUM 100 MG PO CAPS: 100.0000 mg | ORAL_CAPSULE | Freq: Two times a day (BID) | ORAL | 0 refills | Status: AC

## 2021-08-17 MED ORDER — OXYCODONE-ACETAMINOPHEN 5-325 MG PO TABS: 1.0000 | ORAL_TABLET | ORAL | Status: DC | PRN

## 2021-08-17 NOTE — Plan of Care (Signed)
  Problem: Education: Goal: Ability to describe self-care measures that may prevent or decrease complications (Diabetes Survival Skills Education) will improve Outcome: Completed/Met Goal: Individualized Educational Video(s) Outcome: Completed/Met   Problem: Coping: Goal: Ability to adjust to condition or change in health will improve Outcome: Completed/Met   Problem: Fluid Volume: Goal: Ability to maintain a balanced intake and output will improve Outcome: Completed/Met   Problem: Health Behavior/Discharge Planning: Goal: Ability to identify and utilize available resources and services will improve Outcome: Completed/Met Goal: Ability to manage health-related needs will improve Outcome: Completed/Met   Problem: Metabolic: Goal: Ability to maintain appropriate glucose levels will improve Outcome: Completed/Met   Problem: Nutritional: Goal: Maintenance of adequate nutrition will improve Outcome: Completed/Met Goal: Progress toward achieving an optimal weight will improve Outcome: Completed/Met   Problem: Skin Integrity: Goal: Risk for impaired skin integrity will decrease Outcome: Completed/Met   Problem: Tissue Perfusion: Goal: Adequacy of tissue perfusion will improve Outcome: Completed/Met   Problem: Education: Goal: Knowledge of General Education information will improve Description: Including pain rating scale, medication(s)/side effects and non-pharmacologic comfort measures Outcome: Completed/Met   Problem: Health Behavior/Discharge Planning: Goal: Ability to manage health-related needs will improve Outcome: Completed/Met   Problem: Clinical Measurements: Goal: Ability to maintain clinical measurements within normal limits will improve Outcome: Completed/Met Goal: Will remain free from infection Outcome: Completed/Met Goal: Diagnostic test results will improve Outcome: Completed/Met Goal: Respiratory complications will improve Outcome: Completed/Met Goal:  Cardiovascular complication will be avoided Outcome: Completed/Met   Problem: Activity: Goal: Risk for activity intolerance will decrease Outcome: Completed/Met   Problem: Nutrition: Goal: Adequate nutrition will be maintained Outcome: Completed/Met   Problem: Coping: Goal: Level of anxiety will decrease Outcome: Completed/Met   Problem: Elimination: Goal: Will not experience complications related to bowel motility Outcome: Completed/Met Goal: Will not experience complications related to urinary retention Outcome: Completed/Met   Problem: Pain Managment: Goal: General experience of comfort will improve Outcome: Completed/Met   Problem: Safety: Goal: Ability to remain free from injury will improve Outcome: Completed/Met   Problem: Skin Integrity: Goal: Risk for impaired skin integrity will decrease Outcome: Completed/Met   Problem: Education: Goal: Ability to verbalize activity precautions or restrictions will improve Outcome: Completed/Met Goal: Knowledge of the prescribed therapeutic regimen will improve Outcome: Completed/Met Goal: Understanding of discharge needs will improve Outcome: Completed/Met   Problem: Activity: Goal: Ability to avoid complications of mobility impairment will improve Outcome: Completed/Met Goal: Ability to tolerate increased activity will improve Outcome: Completed/Met Goal: Will remain free from falls Outcome: Completed/Met   Problem: Bowel/Gastric: Goal: Gastrointestinal status for postoperative course will improve Outcome: Completed/Met   Problem: Clinical Measurements: Goal: Ability to maintain clinical measurements within normal limits will improve Outcome: Completed/Met Goal: Postoperative complications will be avoided or minimized Outcome: Completed/Met Goal: Diagnostic test results will improve Outcome: Completed/Met   Problem: Pain Management: Goal: Pain level will decrease Outcome: Completed/Met   Problem: Skin  Integrity: Goal: Will show signs of wound healing Outcome: Completed/Met   Problem: Health Behavior/Discharge Planning: Goal: Identification of resources available to assist in meeting health care needs will improve Outcome: Completed/Met   Problem: Bladder/Genitourinary: Goal: Urinary functional status for postoperative course will improve Outcome: Completed/Met

## 2021-08-17 NOTE — Discharge Summary (Signed)
Physician Discharge Summary  Patient ID: Ricky Shields MRN: [id number] DOB/AGE: 1945-04-14 76 y.o.  Admit date: 08/16/2021 Discharge date: 08/17/2021  Admission Diagnoses: Lumbar spinal stenosis with neurogenic claudication, lumbar degenerative disease, lumbar disc, lumbar radiculopathy  Discharge Diagnoses: The same Principal Problem:   Spinal stenosis of lumbar region with neurogenic claudication   Discharged Condition: good  Hospital Course: I performed bilateral L3-4 and L4-5 laminotomy/foraminotomies with a right L3-4 discectomy on the patient on 08/16/2021.  The surgery went well.  The patient's postoperative course was unremarkable.  On postoperative day #1 he requested discharge home.  The patient, his wife and daughter, were given verbal and written discharge instructions.  All their questions were answered.  Consults: PT, OT, care management Significant Diagnostic Studies: None Treatments: Bilateral L3-4 and L4-5 laminotomy/foraminotomies, right L3-4 discectomy using microdissection Discharge Exam: Blood pressure 132/81, pulse 71, temperature 98.2 F (36.8 C), temperature source Oral, resp. rate 18, height _0  (1.676 m), weight 69 kg, SpO2 99 %. The patient is alert and pleasant.  He looks well.  His strength is normal.  Disposition: Home  Discharge Instructions     Call MD for:  difficulty breathing, headache or visual disturbances   Complete by: As directed    Call MD for:  extreme fatigue   Complete by: As directed    Call MD for:  hives   Complete by: As directed    Call MD for:  persistant dizziness or light-headedness   Complete by: As directed    Call MD for:  persistant nausea and vomiting   Complete by: As directed    Call MD for:  redness, tenderness, or signs of infection (pain, swelling, redness, odor or green/yellow discharge around incision site)   Complete by: As directed    Call MD for:  severe uncontrolled pain   Complete by: As directed    Call  MD for:  temperature >100.4   Complete by: As directed    Diet - low sodium heart healthy   Complete by: As directed    Discharge instructions   Complete by: As directed    Call [phone] for a followup appointment. Take a stool softener while you are using pain medications.   Driving Restrictions   Complete by: As directed    Do not drive for 2 weeks.   Increase activity slowly   Complete by: As directed    Lifting restrictions   Complete by: As directed    Do not lift more than 5 pounds. No excessive bending or twisting.   May shower / Bathe   Complete by: As directed    Remove the dressing for 3 days after surgery.  You may shower, but leave the incision alone.   Remove dressing in 48 hours   Complete by: As directed       Allergies as of 08/17/2021       Reactions   Penicillins Rash   Has patient had a PCN reaction causing immediate rash, facial/tongue/throat swelling, SOB or lightheadedness with hypotension: No Has patient had a PCN reaction causing severe rash involving mucus membranes or skin necrosis: No Has patient had a PCN reaction that required hospitalization No Has patient had a PCN reaction occurring within the last 10 years: No If all of the above answers are "NO", then may proceed with Cephalosporin use.        Medication List     STOP taking these medications    acetaminophen 500 MG tablet Commonly  known as: TYLENOL       TAKE these medications    aspirin EC 81 MG tablet Take 81 mg by mouth daily. Swallow whole.   atorvastatin 20 MG tablet Commonly known as: LIPITOR Take 1 tablet (20 mg total) by mouth daily.   cholecalciferol 25 MCG (1000 UNIT) tablet Commonly known as: VITAMIN D3 Take 1,000 Units by mouth daily.   docusate sodium 100 MG capsule Commonly known as: COLACE Take 1 capsule (100 mg total) by mouth 2 (two) times daily.   empagliflozin 10 MG Tabs tablet Commonly known as: Jardiance TAKE 1 TABLET(10 MG) BY MOUTH DAILY  BEFORE AND BREAKFAST   esomeprazole 20 MG capsule Commonly known as: NEXIUM Take 20 mg by mouth daily as needed (acid reflux).   Fish Oil 1200 MG Caps Take 1,200 mg by mouth daily.   gabapentin 100 MG capsule Commonly known as: NEURONTIN Take 100 mg by mouth 3 (three) times daily as needed (pain).   glucose blood test strip Use as instructed to test blood sugars three times a day DX:E11.65   OneTouch Ultra test strip Generic drug: glucose blood Use as instructed   Lidocaine 4 % Gel Apply 1 Application topically daily as needed (pain).   metFORMIN 1000 MG tablet Commonly known as: GLUCOPHAGE TAKE 1 TABLET(1000 MG) BY MOUTH TWICE DAILY WITH A MEAL   multivitamin with minerals Tabs tablet Take 1 tablet by mouth daily. One a Day Men's   Narcan 4 MG/0.1ML Liqd nasal spray kit Generic drug: naloxone Place 1 spray into the nose as needed (opioid overdose).   Olmesartan-amLODIPine-HCTZ 40-10-12.5 MG Tabs Take 1 tablet by mouth daily.   oxyCODONE-acetaminophen 5-325 MG tablet Commonly known as: PERCOCET/ROXICET Take 1-2 tablets by mouth every 4 (four) hours as needed for moderate pain.   PreserVision AREDS 2 Caps Take 1 capsule by mouth 2 (two) times daily.   tamsulosin 0.4 MG Caps capsule Commonly known as: FLOMAX Take 0.4 mg by mouth daily.   traMADol 50 MG tablet Commonly known as: ULTRAM Take 1 tablet (50 mg total) by mouth every 12 (twelve) hours as needed.   valACYclovir 500 MG tablet Commonly known as: VALTREX Take 500 mg by mouth daily.         Signed: Ophelia Charter 08/17/2021, 7:54 AM

## 2021-08-17 NOTE — Progress Notes (Signed)
Patient transported to his vehicle via wheelchair by volunteer for discharge home; moves all extremities well; in no acute distress nor complaints of pain nor discomfort; incision on his lower back with honeycomb dressing and is clean, dry and intact; room was checked for all his belongings; discharge instructions concerning his medications, incision care, follow up appointment and when to call the doctor as needed were all discussed with patient and his family by RN and all verbalized understanding on the instructions given.

## 2021-08-17 NOTE — Evaluation (Signed)
Occupational Therapy Evaluation Patient Details Name: Ricky Shields MRN: [id number] DOB: [birth year] Today's Date: 08/17/2021   History of Present Illness Pt is a 76 y.o male s/p bilateral L3-5 laminectomy and foraminotomy, and R 3-4 intervertebral discectomy using micro-dissection.   Clinical Impression   PTA, pt lived with his wife and was modified independent in ADL, IADL, and driving. Upon eval, pt performing LB ADL and functional mobility with min guard A. Pt educated and demonstrating compensatory techniques for LB dressing, brace application, oral care, shower transfers, and toileting within spinal precautions. Pt wife and daughter present reporting available to assist as needed. All education provided; all questions answered. Recommend discharge home with no OT follow up. OT to sign off. Re-consult if change in status.      Recommendations for follow up therapy are one component of a multi-disciplinary discharge planning process, led by the attending physician.  Recommendations may be updated based on patient status, additional functional criteria and insurance authorization.   Follow Up Recommendations  No OT follow up    Assistance Recommended at Discharge Intermittent Supervision/Assistance  Patient can return home with the following A little help with walking and/or transfers;A little help with bathing/dressing/bathroom;Help with stairs or ramp for entrance;Assist for transportation;Assistance with cooking/housework    Functional Status Assessment  Patient has had a recent decline in their functional status and demonstrates the ability to make significant improvements in function in a reasonable and predictable amount of time.  Equipment Recommendations  BSC/3in1    Recommendations for Other Services PT consult     Precautions / Restrictions Precautions Precautions: Back Precaution Booklet Issued: Yes (comment) Precaution Comments: Pt recalling 2/3 spinal precautions at  beginning of session, and maintaining all 3 througout session after initial education. Required Braces or Orthoses:  (No brace needed orders) Restrictions Weight Bearing Restrictions: No      Mobility Bed Mobility               General bed mobility comments: In recliner on arrival    Transfers Overall transfer level: Needs assistance Equipment used: Standard walker Transfers: Sit to/from Stand Sit to Stand: Min guard           General transfer comment: Sit<>stand with min guard A      Balance Overall balance assessment: Needs assistance Sitting-balance support: No upper extremity supported Sitting balance-Leahy Scale: Good Sitting balance - Comments: Performing figure 4 position to don LB dressing with no LOB   Standing balance support: Bilateral upper extremity supported, During functional activity Standing balance-Leahy Scale: Poor Standing balance comment: Reliant on RW                           ADL either performed or assessed with clinical judgement   ADL Overall ADL's : Needs assistance/impaired Eating/Feeding: Set up;Sitting   Grooming: Supervision/safety;Standing   Upper Body Bathing: Set up;Sitting   Lower Body Bathing: Set up;Sit to/from stand   Upper Body Dressing : Set up;Sitting   Lower Body Dressing: Min guard;Sit to/from stand Lower Body Dressing Details (indicate cue type and reason): Pt educated and demonstrating ability to LB dress Toilet Transfer: Min guard;Ambulation;Rolling walker (2 wheels);Comfort height toilet Toilet Transfer Details (indicate cue type and reason): Simulated in room   Toileting - Clothing Manipulation Details (indicate cue type and reason): Educated regarding posterior pericare Tub/ Shower Transfer: Walk-in shower;Min guard;Rolling walker (2 wheels) Tub/Shower Transfer Details (indicate cue type and reason): Walk in shower transfer with  use of compensatory techniques Functional mobility during ADLs: Min  guard;Rolling walker (2 wheels) General ADL Comments: Min guard A with RW for functional mobility. A few steps without RW, and close min guard A. Pt with greater confidence and upright posture with use of RW     Vision   Vision Assessment?: No apparent visual deficits     Perception     Praxis      Pertinent Vitals/Pain Pain Assessment Pain Assessment: Faces Faces Pain Scale: Hurts little more Pain Location: operative site Pain Descriptors / Indicators: Operative site guarding, Discomfort (Closing eyes much of sesion while seated) Pain Intervention(s): Limited activity within patient's tolerance, Monitored during session     Hand Dominance     Extremity/Trunk Assessment Upper Extremity Assessment Upper Extremity Assessment: Overall WFL for tasks assessed   Lower Extremity Assessment Lower Extremity Assessment: Defer to PT evaluation   Cervical / Trunk Assessment Cervical / Trunk Assessment: Back Surgery   Communication Communication Communication: No difficulties   Cognition Arousal/Alertness: Awake/alert Behavior During Therapy: WFL for tasks assessed/performed, Flat affect Overall Cognitive Status: Within Functional Limits for tasks assessed                                 General Comments: Pt recalling 2/3 spinal precautions on arrival. Maintaining 3/3 during session after initial education     General Comments  VSS, daughter and wife present    Exercises     Shoulder Instructions      Home Living Family/patient expects to be discharged to:: Private residence Living Arrangements: Spouse/significant other Available Help at Discharge: Family;Available 24 hours/day Type of Home: Other(Comment) ([place] home) Home Access: Stairs to enter Technical brewer of Steps: 4 Entrance Stairs-Rails: None Home Layout: Multi-level (3 level home. Will be on first and second floor) Alternate Level Stairs-Number of Steps: 10 (5, landing, 5  additional) Alternate Level Stairs-Rails: Right Bathroom Shower/Tub: Tub/shower unit;Walk-in shower (Reporting will use walk in after surgery)   Bathroom Toilet: Standard     Home Equipment: Cane - single point          Prior Functioning/Environment Prior Level of Function : Independent/Modified Independent;Driving             Mobility Comments: Walking with no ADL most of the time. Cane on bad days ADLs Comments: Independent prior to surgery        OT Problem List: Decreased strength;Decreased activity tolerance;Impaired balance (sitting and/or standing);Pain      OT Treatment/Interventions:      OT Goals(Current goals can be found in the care plan section) Acute Rehab OT Goals Patient Stated Goal: None stated OT Goal Formulation: With patient  OT Frequency:      Co-evaluation              AM-PAC OT "6 Clicks" Daily Activity     Outcome Measure Help from another person eating meals?: A Little Help from another person taking care of personal grooming?: A Little Help from another person toileting, which includes using toliet, bedpan, or urinal?: A Little Help from another person bathing (including washing, rinsing, drying)?: A Little Help from another person to put on and taking off regular upper body clothing?: A Little Help from another person to put on and taking off regular lower body clothing?: A Little 6 Click Score: 18   End of Session Equipment Utilized During Treatment: Gait belt;Rolling walker (2 wheels) Nurse Communication: Mobility status  Activity Tolerance: Patient tolerated treatment well Patient left: in chair;with call bell/phone within reach  OT Visit Diagnosis: Unsteadiness on feet (R26.81);Muscle weakness (generalized) (M62.81);Pain Pain - part of body:  (operative site)                Time: 0920-0939 OT Time Calculation (min): 19 min Charges:  OT General Charges $OT Visit: 1 Visit OT Evaluation $OT Eval Low Complexity: 1  Low  Shanda Howells, OTR/L [facility] Acute Rehabilitation Office: [phone]   Lula Olszewski 08/17/2021, 9:56 AM

## 2021-08-17 NOTE — Evaluation (Signed)
Physical Therapy Evaluation Patient Details Name: Ricky Shields MRN: [id number] DOB: 11-24-1945 Today's Date: 08/17/2021  History of Present Illness  Pt is a 76 y.o male s/p bilateral L3-5 laminectomy and foraminotomy, and R 3-4 intervertebral discectomy using micro-dissection on 08/16/2021. PMH signficant for DM, BPH with urinary retention, HTN.  Clinical Impression  Pt admitted with above diagnosis. At the time of PT eval, pt was able to demonstrate transfers and ambulation with gross min guard assist to supervision for safety and RW for support. Pt was educated on precautions, positioning recommendations, appropriate activity progression, and car transfer. Pt currently with functional limitations due to the deficits listed below (see PT Problem List). Pt will benefit from skilled PT to increase their independence and safety with mobility to allow discharge to the venue listed below.         Recommendations for follow up therapy are one component of a multi-disciplinary discharge planning process, led by the attending physician.  Recommendations may be updated based on patient status, additional functional criteria and insurance authorization.  Follow Up Recommendations No PT follow up      Assistance Recommended at Discharge PRN  Patient can return home with the following  A little help with walking and/or transfers;A little help with bathing/dressing/bathroom;Assistance with cooking/housework;Assist for transportation;Help with stairs or ramp for entrance    Equipment Recommendations Rolling walker (2 wheels);BSC/3in1  Recommendations for Other Services       Functional Status Assessment Patient has had a recent decline in their functional status and demonstrates the ability to make significant improvements in function in a reasonable and predictable amount of time.     Precautions / Restrictions Precautions Precautions: Back Precaution Booklet Issued: Yes (comment) Precaution  Comments: Pt recalling 2/3 spinal precautions at beginning of session, and maintaining all 3 througout session after initial education. Required Braces or Orthoses:  (No brace needed orders) Restrictions Weight Bearing Restrictions: No      Mobility  Bed Mobility               General bed mobility comments: Pt was received sitting up in the recliner. Verbally reviewed log roll technique.    Transfers Overall transfer level: Needs assistance Equipment used: None Transfers: Sit to/from Stand Sit to Stand: Min guard           General transfer comment: Close guard for safety as pt powered up to full stand. Did not utilize walker, but no LOB noted.    Ambulation/Gait Ambulation/Gait assistance: Min guard Gait Distance (Feet): 300 Feet Assistive device: Rolling walker (2 wheels) Gait Pattern/deviations: Step-through pattern, Decreased stride length, Trunk flexed, Narrow base of support Gait velocity: Decreased Gait velocity interpretation: 1.31 - 2.62 ft/sec, indicative of limited community ambulator   General Gait Details: Very flexed trunk but able to make corrective changes with multimodal cues. Pt also cued for closer walker proximity and forward gaze. No overt LOB noted however hands on guarding provided throughout.  Stairs Stairs: Yes Stairs assistance: Min guard Stair Management: One rail Right, One rail Left, Step to pattern, Forwards Number of Stairs: 10 General stair comments: VC's for sequencing and general safety. No assist requried but hands on guarding provided throughout.  Wheelchair Mobility    Modified Rankin (Stroke Patients Only)       Balance Overall balance assessment: Needs assistance Sitting-balance support: No upper extremity supported Sitting balance-Leahy Scale: Fair     Standing balance support: Bilateral upper extremity supported, During functional activity, Reliant on assistive device for  balance Standing balance-Leahy Scale:  Poor                               Pertinent Vitals/Pain Pain Assessment Pain Assessment: Faces Faces Pain Scale: Hurts little more Pain Location: operative site Pain Descriptors / Indicators: Operative site guarding, Grimacing, Sore Pain Intervention(s): Limited activity within patient's tolerance, Monitored during session, Repositioned    Home Living Family/patient expects to be discharged to:: Private residence Living Arrangements: Spouse/significant other Available Help at Discharge: Family;Available 24 hours/day Type of Home: Other(Comment) ([place] home) Home Access: Stairs to enter Entrance Stairs-Rails: None Entrance Stairs-Number of Steps: 4 Alternate Level Stairs-Number of Steps: 10 (5, landing, 5 additional) Home Layout: Multi-level (3 level home. Will be on first and second floor) Home Equipment: Cane - single point      Prior Function Prior Level of Function : Independent/Modified Independent;Driving             Mobility Comments: Walking with no ADL most of the time. Cane on bad days ADLs Comments: Independent prior to surgery     Hand Dominance        Extremity/Trunk Assessment   Upper Extremity Assessment Upper Extremity Assessment: Overall WFL for tasks assessed    Lower Extremity Assessment Lower Extremity Assessment: Generalized weakness    Cervical / Trunk Assessment Cervical / Trunk Assessment: Back Surgery (Mild; consistent with pre-op diagnosis)  Communication   Communication: No difficulties  Cognition Arousal/Alertness: Awake/alert Behavior During Therapy: WFL for tasks assessed/performed, Flat affect Overall Cognitive Status: Within Functional Limits for tasks assessed                                          General Comments General comments (skin integrity, edema, etc.): VSS, daughter and wife present    Exercises     Assessment/Plan    PT Assessment Patient needs continued PT services  PT  Problem List Decreased strength;Decreased activity tolerance;Decreased balance;Decreased mobility;Decreased knowledge of use of DME;Decreased safety awareness;Decreased knowledge of precautions;Pain       PT Treatment Interventions DME instruction;Gait training;Stair training;Functional mobility training;Therapeutic activities;Therapeutic exercise;Balance training;Patient/family education    PT Goals (Current goals can be found in the Care Plan section)  Acute Rehab PT Goals Patient Stated Goal: Home today PT Goal Formulation: With patient/family Time For Goal Achievement: 08/24/21 Potential to Achieve Goals: Good    Frequency Min 5X/week     Co-evaluation               AM-PAC PT "6 Clicks" Mobility  Outcome Measure Help needed turning from your back to your side while in a flat bed without using bedrails?: None Help needed moving from lying on your back to sitting on the side of a flat bed without using bedrails?: A Little Help needed moving to and from a bed to a chair (including a wheelchair)?: A Little Help needed standing up from a chair using your arms (e.g., wheelchair or bedside chair)?: A Little Help needed to walk in hospital room?: A Little Help needed climbing 3-5 steps with a railing? : A Little 6 Click Score: 19    End of Session Equipment Utilized During Treatment: Gait belt Activity Tolerance: Patient tolerated treatment well Patient left: with call bell/phone within reach;in chair;with family/visitor present Nurse Communication: Mobility status PT Visit Diagnosis: Unsteadiness on feet (R26.81);Pain Pain -  part of body:  (back)    Time: 1000-1032 PT Time Calculation (min) (ACUTE ONLY): 32 min   Charges:   PT Evaluation $PT Eval Low Complexity: 1 Low PT Treatments $Gait Training: 8-22 mins        Rolinda Roan, PT, DPT Acute Rehabilitation Services Secure Chat Preferred Office: [phone]   Thelma Comp 08/17/2021, 12:19 PM

## 2021-10-03 ENCOUNTER — Telehealth: Payer: Self-pay

## 2021-10-03 NOTE — Chronic Care Management (AMB) (Signed)
Care Gap(s) Not Met that Need to be Addressed:  Eye Exam for Patients With Diabetes   Action Taken: Reviewed chart last exam 01-02-2021. Contacted Dr. Venetia Maxon and next appointment is 01-03-2022.   Follow Up: Make sure notes are in chart after 01-03-2022 appointment.  [place] Pharmacist Assistant [phone]

## 2021-11-12 ENCOUNTER — Other Ambulatory Visit: Payer: Self-pay | Admitting: Nurse Practitioner

## 2021-11-12 DIAGNOSIS — E119 Type 2 diabetes mellitus without complications: Secondary | ICD-10-CM

## 2021-11-19 ENCOUNTER — Ambulatory Visit (INDEPENDENT_AMBULATORY_CARE_PROVIDER_SITE_OTHER): Payer: PPO | Admitting: Nurse Practitioner

## 2021-11-19 ENCOUNTER — Encounter: Payer: Self-pay | Admitting: Nurse Practitioner

## 2021-11-19 VITALS — BP 138/80 | HR 86 | Temp 98.5°F | Ht 66.0 in | Wt 152.0 lb

## 2021-11-19 DIAGNOSIS — E782 Mixed hyperlipidemia: Secondary | ICD-10-CM | POA: Diagnosis not present

## 2021-11-19 DIAGNOSIS — E119 Type 2 diabetes mellitus without complications: Secondary | ICD-10-CM | POA: Diagnosis not present

## 2021-11-19 DIAGNOSIS — I10 Essential (primary) hypertension: Secondary | ICD-10-CM

## 2021-11-19 DIAGNOSIS — M48061 Spinal stenosis, lumbar region without neurogenic claudication: Secondary | ICD-10-CM | POA: Diagnosis not present

## 2021-11-19 DIAGNOSIS — E11649 Type 2 diabetes mellitus with hypoglycemia without coma: Secondary | ICD-10-CM | POA: Diagnosis not present

## 2021-11-19 MED ORDER — SPIRONOLACTONE 25 MG PO TABS: 12.5000 mg | ORAL_TABLET | Freq: Every day | ORAL | 1 refills | Status: DC

## 2021-11-19 NOTE — Progress Notes (Signed)
I,[name],acting as a Education administrator for [employer], FNP.,have documented all relevant documentation on the behalf of [name], FNP,as directed by  [name], FNP while in the presence of [name], [place]..  Subjective:     Patient ID: Ricky Shields , male    DOB: [birth year] , 76 y.o.   MRN: [id number]   Chief Complaint  Patient presents with   Diabetes    HPI  The patient is here today for a diabetes and blood pressure f/u.  He had back surgery by Dr. Arnoldo Morale - feels like he is doing better, pain is better. He has an appt with Dr. Arnoldo Morale the first of December. He has taken his blood pressure medications today.   He reports having his vaccines covid and flu at [facility] from Last 3 Encounters: 11/19/21 : 152 lb (68.9 kg) 08/16/21 : 152 lb 1.9 oz (69 kg) 08/09/21 : 154 lb 12.8 oz (70.2 kg)    Diabetes He presents for his follow-up diabetic visit. He has type 2 diabetes mellitus. His disease course has been stable. There are no hypoglycemic associated symptoms. Pertinent negatives for hypoglycemia include no dizziness or headaches. Pertinent negatives for diabetes include no blurred vision, no chest pain, no fatigue, no polydipsia, no polyphagia and no polyuria. There are no hypoglycemic complications. Symptoms are stable. There are no diabetic complications. Risk factors for coronary artery disease include male sex and sedentary lifestyle. Current diabetic treatment includes oral agent (monotherapy). He is compliant with treatment all of the time. His weight is stable. He is following a diabetic diet. When asked about meal planning, he reported none. He has not had a previous visit with a dietitian. He participates in exercise intermittently. (Blood sugar was 78, continues with jardiance and metformin) An ACE inhibitor/angiotensin II receptor blocker is being taken. He does not see a podiatrist.Eye exam is current.  Hypertension This is a chronic  problem. The current episode started more than 1 year ago. The problem is unchanged. The problem is controlled. Pertinent negatives include no anxiety, blurred vision, chest pain, headaches or palpitations. There are no associated agents to hypertension. Risk factors for coronary artery disease include sedentary lifestyle and male gender. Past treatments include angiotensin blockers and diuretics. The current treatment provides no improvement. There are no compliance problems.  There is no history of angina. There is no history of chronic renal disease.     Past Medical History:  Diagnosis Date   Back pain    intermittent-prior military injury   BPH (benign prostatic hypertrophy) with urinary retention    Diabetes mellitus without complication (HCC)    GERD (gastroesophageal reflux disease)    High cholesterol    Hypertension      Family History  Problem Relation Age of Onset   Stroke Mother    Stroke Father      Current Outpatient Medications:    aspirin EC 81 MG tablet, Take 81 mg by mouth daily. Swallow whole., Disp: , Rfl:    atorvastatin (LIPITOR) 20 MG tablet, Take 1 tablet (20 mg total) by mouth daily., Disp: 90 tablet, Rfl: 3   cholecalciferol (VITAMIN D3) 25 MCG (1000 UNIT) tablet, Take 1,000 Units by mouth daily., Disp: , Rfl:    docusate sodium (COLACE) 100 MG capsule, Take 1 capsule (100 mg total) by mouth 2 (two) times daily., Disp: 30 capsule, Rfl: 0   esomeprazole (NEXIUM) 20 MG capsule, Take 20 mg by mouth daily as needed (acid  reflux)., Disp: , Rfl:    glucose blood (ONETOUCH ULTRA) test strip, Use as instructed, Disp: 100 each, Rfl: 2   glucose blood test strip, Use as instructed to test blood sugars three times a day DX:E11.65, Disp: 100 each, Rfl: 12   JARDIANCE 10 MG TABS tablet, TAKE 1 TABLET(10 MG) BY MOUTH DAILY BEFORE BREAKFAST, Disp: 30 tablet, Rfl: 5   Lidocaine 4 % GEL, Apply 1 Application topically daily as needed (pain)., Disp: , Rfl:    metFORMIN  (GLUCOPHAGE) 1000 MG tablet, TAKE 1 TABLET(1000 MG) BY MOUTH TWICE DAILY WITH A MEAL, Disp: 180 tablet, Rfl: 1   Multiple Vitamin (MULTIVITAMIN WITH MINERALS) TABS tablet, Take 1 tablet by mouth daily. One a Day Men's, Disp: , Rfl:    Multiple Vitamins-Minerals (PRESERVISION AREDS 2) CAPS, Take 1 capsule by mouth 2 (two) times daily., Disp: , Rfl:    naloxone (NARCAN) nasal spray 4 mg/0.1 mL, Place 1 spray into the nose as needed (opioid overdose)., Disp: , Rfl:    Olmesartan-amLODIPine-HCTZ 40-10-12.5 MG TABS, Take 1 tablet by mouth daily., Disp: 90 tablet, Rfl: 1   Omega-3 Fatty Acids (FISH OIL) 1200 MG CAPS, Take 1,200 mg by mouth daily., Disp: , Rfl:    spironolactone (ALDACTONE) 25 MG tablet, Take 0.5 tablets (12.5 mg total) by mouth daily., Disp: 45 tablet, Rfl: 1   tamsulosin (FLOMAX) 0.4 MG CAPS capsule, Take 0.4 mg by mouth daily., Disp: , Rfl: 5   Allergies  Allergen Reactions   Penicillins Rash    Has patient had a PCN reaction causing immediate rash, facial/tongue/throat swelling, SOB or lightheadedness with hypotension: No Has patient had a PCN reaction causing severe rash involving mucus membranes or skin necrosis: No Has patient had a PCN reaction that required hospitalization No Has patient had a PCN reaction occurring within the last 10 years: No If all of the above answers are "NO", then may proceed with Cephalosporin use.      Review of Systems  Constitutional: Negative.  Negative for fatigue.  Eyes:  Negative for blurred vision.  Respiratory: Negative.    Cardiovascular: Negative.  Negative for chest pain and palpitations.  Gastrointestinal: Negative.   Endocrine: Negative for polydipsia, polyphagia and polyuria.  Neurological: Negative.  Negative for dizziness and headaches.     Today's Vitals   11/19/21 1049 11/19/21 1130  BP: 132/74 138/80  Pulse: 86   Temp: 98.5 F (36.9 C)   TempSrc: Oral   Weight: 152 lb (68.9 kg)   Height: _0  (1.676 m)    Body  mass index is 24.53 kg/m.  Wt Readings from Last 3 Encounters:  11/19/21 152 lb (68.9 kg)  08/16/21 152 lb 1.9 oz (69 kg)  08/09/21 154 lb 12.8 oz (70.2 kg)    Objective:  Physical Exam Vitals reviewed.  Constitutional:      General: He is not in acute distress.    Appearance: Normal appearance.  Cardiovascular:     Rate and Rhythm: Normal rate and regular rhythm.     Pulses: Normal pulses.     Heart sounds: Normal heart sounds. No murmur heard. Pulmonary:     Effort: Pulmonary effort is normal. No respiratory distress.     Breath sounds: Normal breath sounds. No wheezing.  Musculoskeletal:        General: No swelling. Normal range of motion.     Right lower leg: No edema.     Left lower leg: No edema.  Skin:    General:  Skin is warm and dry.     Capillary Refill: Capillary refill takes less than 2 seconds.     Coloration: Skin is not jaundiced.  Neurological:     General: No focal deficit present.     Mental Status: He is alert and oriented to person, place, and time.     Cranial Nerves: No cranial nerve deficit.     Motor: No weakness.  Psychiatric:        Mood and Affect: Mood normal.        Behavior: Behavior normal.        Thought Content: Thought content normal.        Judgment: Judgment normal.         Assessment And Plan:     1. Uncontrolled type 2 diabetes mellitus with hypoglycemia without coma ([place]) Comments: HgbA1c is slightly up at last visit, may need to increase Jardiance pending lab results. - Hemoglobin A1c  2. Essential hypertension Comments: Repeat blood pressure is higher, will start spironolactone 12.5 mg and he is to return in 1 week to recheck BMP - BMP8+EGFR - spironolactone (ALDACTONE) 25 MG tablet; Take 0.5 tablets (12.5 mg total) by mouth daily.  Dispense: 45 tablet; Refill: 1 - BMP8+eGFR; Future  3. Mixed hyperlipidemia Comments: Continue statin, tolerating well.  4. Spinal stenosis of lumbar region without neurogenic  claudication Comments: Had spinal surgery last month with Dr Arnoldo Morale and is overall doing well.    Patient was given opportunity to ask questions. Patient verbalized understanding of the plan and was able to repeat key elements of the plan. All questions were answered to their satisfaction.  [name], FNP   I, [name], FNP, have reviewed all documentation for this visit. The documentation on 11/19/21 for the exam, diagnosis, procedures, and orders are all accurate and complete.   IF YOU HAVE BEEN REFERRED TO A SPECIALIST, IT MAY TAKE 1-2 WEEKS TO SCHEDULE/PROCESS THE REFERRAL. IF YOU HAVE NOT HEARD FROM US/SPECIALIST IN TWO WEEKS, PLEASE GIVE [place] A CALL AT [phone].   THE PATIENT IS ENCOURAGED TO PRACTICE SOCIAL DISTANCING DUE TO THE COVID-19 PANDEMIC.

## 2021-11-19 NOTE — Patient Instructions (Signed)

## 2021-11-20 ENCOUNTER — Telehealth: Payer: Self-pay

## 2021-11-20 LAB — BMP8+EGFR
BUN/Creatinine Ratio: 16 (ref 10–24)
BUN: 16 mg/dL (ref 8–27)
CO2: 22 mmol/L (ref 20–29)
Calcium: 10.2 mg/dL (ref 8.6–10.2)
Chloride: 101 mmol/L (ref 96–106)
Creatinine, Ser: 1.01 mg/dL (ref 0.76–1.27)
Glucose: 152 mg/dL — ABNORMAL HIGH (ref 70–99)
Potassium: 4.1 mmol/L (ref 3.5–5.2)
Sodium: 141 mmol/L (ref 134–144)
eGFR: 78 mL/min/{1.73_m2} (ref >59)

## 2021-11-20 LAB — HEMOGLOBIN A1C
Est. average glucose Bld gHb Est-mCnc: 137 mg/dL
Hgb A1c MFr Bld: 6.4 % — ABNORMAL HIGH (ref 4.8–5.6)

## 2021-11-20 NOTE — Progress Notes (Signed)
    Called Ricky Shields, No answer, left message of appointment on 11-21-2021 at 3:15 via telephone visit with [name], Pharm D. Notified to have all medications, supplements, blood pressure and/or blood sugar logs available during appointment and to return call if need to reschedule.  Care Gaps: Flu vaccine overdue Covid booster overdue  Star Rating Drug: Atorvastatin 20 mg- Last filled 09-29-2021 90 DS [facility] Jardiance 10 mg- Last filled 11-12-2021 30 DS [name] Metformin 1000 mg- Last filled 09-22-2021 90 DS [name] Olmesartan-amlodipine-HCTZ 40-10-12.5 mg- Last filled 09-04-2021 90 DS [name]  Any gaps in medications fill history? No  [place] Pharmacist Assistant [phone]

## 2021-11-21 ENCOUNTER — Ambulatory Visit (INDEPENDENT_AMBULATORY_CARE_PROVIDER_SITE_OTHER): Payer: PPO

## 2021-11-21 DIAGNOSIS — I10 Essential (primary) hypertension: Secondary | ICD-10-CM

## 2021-11-21 DIAGNOSIS — E782 Mixed hyperlipidemia: Secondary | ICD-10-CM

## 2021-11-21 NOTE — Progress Notes (Cosign Needed Addendum)
Chronic Care Management Pharmacy Note  11/23/2021 Name:  Ricky Shields MRN:  [id number] DOB:  May 09, 1945  Summary: Patient reports that he has been doing well   Recommendations/Changes made from today's visit: Recommend patients Atorvastatin 20 mg tablet is increased to Atorvastatin 40 mg tablet once per day Recommend patients Jardiance 10 mg tablet is increased 25 mg tablet once per day   Plan: Collaborate with PCP to change patients current regimens as described above.    Subjective: Ricky Shields is an 76 y.o. year old male who is a primary patient of [name], [place].  The CCM team was consulted for assistance with disease management and care coordination needs.    Engaged with patient by telephone for follow up visit in response to provider referral for pharmacy case management and/or care coordination services.   Consent to Services:  The patient was given information about Chronic Care Management services, agreed to services, and gave verbal consent prior to initiation of services.  Please see initial visit note for detailed documentation.   Patient Care Team: [name], FNP as PCP - General (General Practice) Mayford Knife, [facility] (Pharmacist)  Recent office visits: 11/19/2021 PCP OV  Recent consult visits: 7/26 specialist OV 7/24-specialist OV 7/19-specialist OV 7/18- specialist OV 7/12- Specialist [facility] visits: 08/16/2021 Neurosurgery   Objective:  Lab Results  Component Value Date   CREATININE 1.01 11/19/2021   BUN 16 11/19/2021   EGFR 78 11/19/2021   GFRNONAA 60 01/10/2020   GFRAA 70 01/10/2020   NA 141 11/19/2021   K 4.1 11/19/2021   CALCIUM 10.2 11/19/2021   CO2 22 11/19/2021   GLUCOSE 152 (H) 11/19/2021    Lab Results  Component Value Date/Time   HGBA1C 6.4 (H) 11/19/2021 11:40 AM   HGBA1C 6.9 (H) 07/18/2021 11:44 AM   MICROALBUR 30 07/08/2019 12:51 PM   MICROALBUR 80 06/25/2018 12:39 PM    Last diabetic Eye exam:  Lab  Results  Component Value Date/Time   HMDIABEYEEXA No Retinopathy 01/02/2021 12:00 AM    Last diabetic Foot exam: No results found for: "HMDIABFOOTEX"   Lab Results  Component Value Date   CHOL 153 07/18/2021   HDL 60 07/18/2021   LDLCALC 81 07/18/2021   TRIG 60 07/18/2021   CHOLHDL 2.6 07/18/2021       Latest Ref Rng & Units 07/18/2021   11:44 AM 11/16/2020   11:08 AM 07/12/2020   10:39 AM  Hepatic Function  Total Protein 6.0 - 8.5 g/dL 7.2  7.2  7.5   Albumin 3.7 - 4.7 g/dL 4.6  4.7  5.0   AST 0 - 40 IU/L _0 ALT 0 - 44 IU/L _1 Alk Phosphatase 44 - 121 IU/L 83  94  73   Total Bilirubin 0.0 - 1.2 mg/dL 0.5  0.6  0.7     Lab Results  Component Value Date/Time   TSH 1.510 07/18/2021 11:44 AM       Latest Ref Rng & Units 07/18/2021   11:44 AM 07/12/2020   10:39 AM 07/08/2019    2:07 PM  CBC  WBC 3.4 - 10.8 x10E3/uL 6.1  5.9  6.4   Hemoglobin 13.0 - 17.7 g/dL 15.1  14.9  14.3   Hematocrit 37.5 - 51.0 % 44.1  43.2  39.8   Platelets 150 - 450 x10E3/uL 279  262  250    Clinical ASCVD: No  The  10-year ASCVD risk score (Arnett DK, et al., 2019) is: 38.1%   Values used to calculate the score:     Age: 76 years     Sex: Male     Is Non-Hispanic African American: Yes     Diabetic: Yes     Tobacco smoker: No     Systolic Blood Pressure: 381 mmHg     Is BP treated: Yes     HDL Cholesterol: 60 mg/dL     Total Cholesterol: 153 mg/dL       11/19/2021   10:51 AM 08/09/2021    2:34 PM 07/18/2021   10:31 AM  Depression screen PHQ 2/9  Decreased Interest 0 0 0  Down, Depressed, Hopeless 0 0 0  PHQ - 2 Score 0 0 0     Social History   Tobacco Use  Smoking Status Never  Smokeless Tobacco Never   BP Readings from Last 3 Encounters:  11/19/21 138/80  08/17/21 135/71  08/09/21 133/76   Pulse Readings from Last 3 Encounters:  11/19/21 86  08/17/21 77  08/09/21 83   Wt Readings from Last 3 Encounters:  11/19/21 152 lb (68.9 kg)  08/16/21 152 lb 1.9 oz  (69 kg)  08/09/21 154 lb 12.8 oz (70.2 kg)   BMI Readings from Last 3 Encounters:  11/19/21 24.53 kg/m  08/16/21 24.55 kg/m  08/09/21 22.86 kg/m    Assessment/Interventions: Review of patient past medical history, allergies, medications, health status, including review of consultants reports, laboratory and other test data, was performed as part of comprehensive evaluation and provision of chronic care management services.   SDOH:  (Social Determinants of Health) assessments and interventions performed: No SDOH Interventions    Flowsheet Row Chronic Care Management from 10/07/2019 in [facility] Clinical Support from 07/08/2019 in [facility] Clinical Support from 06/25/2018 in [facility]  SDOH Interventions     Depression Interventions/Treatment  -- PHQ2-9 Score <4 Follow-up Not Indicated PHQ2-9 Score <4 Follow-up Not Indicated  Financial Strain Interventions Intervention Not Indicated -- --      SDOH Screenings   Food Insecurity: No Food Insecurity (08/09/2021)  Housing: Low Risk  (07/12/2020)  Transportation Needs: No Transportation Needs (08/09/2021)  Depression (PHQ2-9): Low Risk  (11/19/2021)  Financial Resource Strain: Low Risk  (08/09/2021)  Physical Activity: Insufficiently Active (08/09/2021)  Social Connections: Socially Integrated (12/23/2019)  Stress: No Stress Concern Present (08/09/2021)  Tobacco Use: Low Risk  (11/19/2021)    CCM Care Plan  Allergies  Allergen Reactions   Penicillins Rash    Has patient had a PCN reaction causing immediate rash, facial/tongue/throat swelling, SOB or lightheadedness with hypotension: No Has patient had a PCN reaction causing severe rash involving mucus membranes or skin necrosis: No Has patient had a PCN reaction that required hospitalization No Has patient had a PCN reaction occurring within the last 10 years: No If all of the above answers are "NO", then may  proceed with Cephalosporin use.     Medications Reviewed Today     Reviewed by Mayford Knife, RPH (Pharmacist) on 11/21/21 at 1527  Med List Status: <None>   Medication Order Taking? Sig Documenting Provider Last Dose Status Informant  aspirin EC 81 MG tablet [id number] No Take 81 mg by mouth daily. Swallow whole. [provider] Taking Active Self  atorvastatin (LIPITOR) 20 MG tablet [id number] No Take 1 tablet (20 mg total) by mouth daily. Glendale Chard, MD Taking Active Self  cholecalciferol (  VITAMIN D3) 25 MCG (1000 UNIT) tablet [id number] No Take 1,000 Units by mouth daily. [provider] Taking Active Self  docusate sodium (COLACE) 100 MG capsule [id number] No Take 1 capsule (100 mg total) by mouth 2 (two) times daily. Newman Pies, MD Taking Active   esomeprazole (NEXIUM) 20 MG capsule [id number] No Take 20 mg by mouth daily as needed (acid reflux). [provider] Taking Active Self  glucose blood (ONETOUCH ULTRA) test strip [id number] No Use as instructed [name], FNP Taking Active Self  glucose blood test strip [id number] No Use as instructed to test blood sugars three times a day DX:E11.65 [name], FNP Taking Active Self  JARDIANCE 10 MG TABS tablet [id number] No TAKE 1 TABLET(10 MG) BY MOUTH DAILY BEFORE Evelena Peat, FNP Taking Active   Lidocaine 4 % GEL [id number] No Apply 1 Application topically daily as needed (pain). [provider] Taking Active Self  metFORMIN (GLUCOPHAGE) 1000 MG tablet [id number] No TAKE 1 TABLET(1000 MG) BY MOUTH TWICE DAILY WITH A MEAL [name], FNP Taking Active Self  Multiple Vitamin (MULTIVITAMIN WITH MINERALS) TABS tablet [id number] No Take 1 tablet by mouth daily. One a Day Men's [provider] Taking Active Self  Multiple Vitamins-Minerals (PRESERVISION AREDS 2) CAPS [id number] No Take 1 capsule by mouth 2 (two) times daily. [provider] Taking Active Self  naloxone  [facility]) nasal spray 4 mg/0.1 mL [id number] No Place 1 spray into the nose as needed (opioid overdose). [provider] Taking Active Self  Olmesartan-amLODIPine-HCTZ 40-10-12.5 MG TABS [id number] No Take 1 tablet by mouth daily. [name], FNP Taking Active Self  Omega-3 Fatty Acids (FISH OIL) 1200 MG CAPS [id number] No Take 1,200 mg by mouth daily. [provider] Taking Active Self  spironolactone (ALDACTONE) 25 MG tablet [id number]  Take 0.5 tablets (12.5 mg total) by mouth daily. [name], FNP  Active   tamsulosin [facility]) 0.4 MG CAPS capsule [id number] No Take 0.4 mg by mouth daily. [provider] Taking Active Self           Med Note Valma Cava   ETK Jan 27, 2016 11:57 AM)              Patient Active Problem List   Diagnosis Date Noted   Spinal stenosis of lumbar region with neurogenic claudication 08/16/2021   Spinal stenosis of lumbar region 02/17/2020   Tingling of skin 03/20/2018   Type 2 diabetes mellitus without complication, without long-term current use of insulin ([place]) 12/19/2017   Essential hypertension 12/19/2017    Immunization History  Administered Date(s) Administered   Fluad Quad(high Dose 65+) 09/30/2018, 10/19/2019   Influenza, High Dose Seasonal PF 10/10/2017   Influenza-Unspecified 11/10/2017, 10/26/2020   PFIZER(Purple Top)SARS-COV-2 Vaccination 02/19/2019, 03/12/2019, 11/13/2019, 12/14/2019, 10/26/2020   Pfizer Covid-19 Vaccine Bivalent Booster 60yr & up 07/11/2021   Pneumococcal Conjugate-13 09/30/2018   Pneumococcal Polysaccharide-23 10/17/2015   Pneumococcal-Unspecified 11/02/2012, 02/09/2014   Tdap 11/02/2012   Zoster Recombinat (Shingrix) 11/22/2020, 01/25/2021    Conditions to be addressed/monitored:  Hypertension and Diabetes  Care Plan : [place] Updates made by PMayford Knife RPH since 11/23/2021 12:00 AM     Problem: HTN, HLD, DM II   Priority: High     Long-Range Goal:  Disease Management   Start Date: 07/03/2021  Recent Progress: On track  Priority: High  Note:   Current Barriers:  Unable to independently monitor therapeutic efficacy Does not maintain contact with provider  office  Pharmacist Clinical Goal(s):  Patient will achieve adherence to monitoring guidelines and medication adherence to achieve therapeutic efficacy maintain control of diabetes and hypertension as evidenced by A1c and BP readings  through collaboration with PharmD and provider.   Interventions: 1:1 collaboration with [name], FNP regarding development and update of comprehensive plan of care as evidenced by provider attestation and co-signature Inter-disciplinary care team collaboration (see longitudinal plan of care) Comprehensive medication review performed; medication list updated in electronic medical record  Hypertension (BP goal <130/80) -Controlled -Current treatment: Olmesartan-amlodipine-hctz 40-10-12.5 mg tablet once per day Appropriate, Effective, Safe, Accessible  Spironolactone 25 mg tablet - taking 1/2 tablet by mouth daily  Appropriate, Effective, Safe, Accessible -Current home readings: 11/8 - 124/79  -Denies hypotensive/hypertensive symptoms -Educated on Daily salt intake goal < 2300 mg; Importance of home blood pressure monitoring; -Counseled to monitor BP at home at least once per week, document, and provide log at future appointments -Recommended to continue current medication  Hyperlipidemia: (LDL goal < 70) -Uncontrolled -Current treatment: Atorvastatin 20 mg tablet once per day Appropriate, Query effective He is taking his medication daily  -Educated on Cholesterol goals;  Benefits of statin for ASCVD risk reduction; Importance of limiting foods high in cholesterol; -We discussed the benefits of increasing his Atorvastatin to 40 mg tablet, Mr. Woehler agreed. He would like to finish the medication he currently has  -Collaborated with PCP and  changed patients current dose to Atorvastatin 40 mg tablet once per day due to ASCVD risk.    Diabetes (A1c goal <7%) -Controlled -Current medications: Metformin 1000 mg tablet twice per day Appropriate, Effective, Safe, Accessible Jardiance 10 mg tablet once per day Appropriate, Query effective Patient reports that he does not have a cost issue with his medication  -Current home glucose readings: he is is checking his BS at least twice per day  fasting glucose: 11/8- 121  -Denies hypoglycemic/hyperglycemic symptoms -Current meal patterns:  breakfast: steak, egg and cheese biscuit and oatmeal, Bo 'Round  dinner: broccoli and fish, or beef bologna sandwich - he does not a lot of food in the afternoon sometimes a fish sandwich drinks: drinking plenty of water  -Educated on A1c and blood sugar goals; Complications of diabetes including kidney damage, retinal damage, and cardiovascular disease; Carbohydrate counting and/or plate method -Mr. Sian and I reviewed the amount of salt and sugar in each meal, and discussed how to read ingredient labels  -Counseled to check feet daily and get yearly eye exams -Collaborated with PCP to order vitamin B level due to use of Metformin and increase Jardiance to 25 mg tablet daily.   Patient Goals/Self-Care Activities Patient will:  - take medications as prescribed as evidenced by patient report and record review focus on medication adherence by taking the same amount of medication daily.   Follow Up Plan: The patient has been provided with contact information for the care management team and has been advised to call with any health related questions or concerns.       Medication Assistance: None required.  Patient affirms current coverage meets needs.  Compliance/Adherence/Medication fill history: Care Gaps: Eye Exam to be completed in December with Dr. Venetia Maxon - 01/03/2022 at 10 AM   Star-Rating Drugs: Atorvastatin 20 mg tablet  Metformin  1000 mg tablet Olmesartan-amlodipine-hctz 40-10-12.5 mg  Patient's preferred pharmacy is:  [facility] [address] [place]  [address] Phone: [phone] Fax: [phone]  Uses pill box? No - patient keeps medication in vials Pt endorses 85% compliance  We discussed: Benefits of medication synchronization, packaging and delivery as well as enhanced pharmacist oversight with [facility]. Patient decided to: Continue current medication management strategy  Care Plan and Follow Up Patient Decision:  Patient agrees to Care Plan and Follow-up.  Plan: The patient has been provided with contact information for the care management team and has been advised to call with any health related questions or concerns.   [name], CPP, PharmD Clinical Pharmacist Practitioner [facility] [phone]

## 2021-11-23 NOTE — Patient Instructions (Addendum)
Visit Information It was great speaking with you today!  Please let me know if you have any questions about our visit.   Goals Addressed             This Visit's Progress    Monitor and Manage My Blood Sugar-Diabetes Type 2       Timeframe:  Long-Range Goal Priority:  High                   Follow Up Date 02/20/2022  In Progress:   - check blood sugar at prescribed times - check blood sugar if I feel it is too high or too low - take the blood sugar log to all doctor visits    Why is this important?   Checking your blood sugar at home helps to keep it from getting very high or very low.  Writing the results in a diary or log helps the doctor know how to care for you.  Your blood sugar log should have the time, date and the results.  Also, write down the amount of insulin or other medicine that you take.  Other information, like what you ate, exercise done and how you were feeling, will also be helpful.             Patient Care Plan: CCM Pharmacy Care Plan     Problem Identified: HTN, HLD, DM II   Priority: High     Long-Range Goal: Disease Management   Start Date: 07/03/2021  Recent Progress: On track  Priority: High  Note:   Current Barriers:  Unable to independently monitor therapeutic efficacy Does not maintain contact with provider office  Pharmacist Clinical Goal(s):  Patient will achieve adherence to monitoring guidelines and medication adherence to achieve therapeutic efficacy maintain control of diabetes and hypertension as evidenced by A1c and BP readings  through collaboration with PharmD and provider.   Interventions: 1:1 collaboration with Ricky Brine, FNP regarding development and update of comprehensive plan of care as evidenced by provider attestation and co-signature Inter-disciplinary care team collaboration (see longitudinal plan of care) Comprehensive medication review performed; medication list updated in electronic medical  record  Hypertension (BP goal <130/80) -Controlled -Current treatment: Olmesartan-amlodipine-hctz 40-10-12.5 mg tablet once per day Appropriate, Effective, Safe, Accessible  Spironolactone 25 mg tablet - taking 1/2 tablet by mouth daily  Appropriate, Effective, Safe, Accessible -Current home readings: 11/8 - 124/79  -Denies hypotensive/hypertensive symptoms -Educated on Daily salt intake goal < 2300 mg; Importance of home blood pressure monitoring; -Counseled to monitor BP at home at least once per week, document, and provide log at future appointments -Recommended to continue current medication  Hyperlipidemia: (LDL goal < 70) -Uncontrolled -Current treatment: Atorvastatin 20 mg tablet once per day Appropriate, Query effective He is taking his medication daily  -Educated on Cholesterol goals;  Benefits of statin for ASCVD risk reduction; Importance of limiting foods high in cholesterol; -We discussed the benefits of increasing his Atorvastatin to 40 mg tablet, Ricky Shields agreed. He would like to finish the medication he currently has  -Collaborated with PCP and changed patients current dose to Atorvastatin 40 mg tablet once per day due to ASCVD risk.    Diabetes (A1c goal <7%) -Controlled -Current medications: Metformin 1000 mg tablet twice per day Appropriate, Effective, Safe, Accessible Jardiance 10 mg tablet once per day Appropriate, Query effective Patient reports that he does not have a cost issue with his medication  -Current home glucose readings: he is is checking his  BS at least twice per day  fasting glucose: 11/8- 121  -Denies hypoglycemic/hyperglycemic symptoms -Current meal patterns:  breakfast: steak, egg and cheese biscuit and oatmeal, Bo 'Round  dinner: broccoli and fish, or beef bologna sandwich - he does not a lot of food in the afternoon sometimes a fish sandwich drinks: drinking plenty of water  -Educated on A1c and blood sugar goals; Complications of  diabetes including kidney damage, retinal damage, and cardiovascular disease; Carbohydrate counting and/or plate method -Ricky Shields and I reviewed the amount of salt and sugar in each meal, and discussed how to read ingredient labels  -Counseled to check feet daily and get yearly eye exams -Collaborated with PCP to order vitamin B level due to use of Metformin and increase Jardiance to 25 mg tablet daily.   Patient Goals/Self-Care Activities Patient will:  - take medications as prescribed as evidenced by patient report and record review focus on medication adherence by taking the same amount of medication daily.   Follow Up Plan: The patient has been provided with contact information for the care management team and has been advised to call with any health related questions or concerns.       Patient agreed to services and verbal consent obtained.   The patient verbalized understanding of instructions, educational materials, and care plan provided today and agreed to receive a mailed copy of patient instructions, educational materials, and care plan.   Ricky Shields, PharmD Clinical Pharmacist [facility] [phone]

## 2021-11-26 ENCOUNTER — Other Ambulatory Visit: Payer: PPO

## 2021-11-26 DIAGNOSIS — I10 Essential (primary) hypertension: Secondary | ICD-10-CM | POA: Diagnosis not present

## 2021-11-27 LAB — BMP8+EGFR
BUN/Creatinine Ratio: 16 (ref 10–24)
BUN: 16 mg/dL (ref 8–27)
CO2: 21 mmol/L (ref 20–29)
Calcium: 10.3 mg/dL — ABNORMAL HIGH (ref 8.6–10.2)
Chloride: 99 mmol/L (ref 96–106)
Creatinine, Ser: 1.01 mg/dL (ref 0.76–1.27)
Glucose: 141 mg/dL — ABNORMAL HIGH (ref 70–99)
Potassium: 5 mmol/L (ref 3.5–5.2)
Sodium: 139 mmol/L (ref 134–144)
eGFR: 78 mL/min/{1.73_m2} (ref >59)

## 2021-11-30 ENCOUNTER — Other Ambulatory Visit: Payer: Self-pay | Admitting: Nurse Practitioner

## 2021-12-04 DIAGNOSIS — R972 Elevated prostate specific antigen [PSA]: Secondary | ICD-10-CM | POA: Diagnosis not present

## 2021-12-12 ENCOUNTER — Telehealth: Payer: Self-pay

## 2021-12-12 MED ORDER — ATORVASTATIN CALCIUM 40 MG PO TABS: 40.0000 mg | ORAL_TABLET | Freq: Every day | ORAL | 1 refills | Status: DC

## 2021-12-12 NOTE — Telephone Encounter (Signed)
  Care Management   Follow Up Note   12/12/2021 Name: Ricky Shields MRN: [id number] DOB: 10/22/1945   Referred by: [name], FNP Reason for referral : No chief complaint on file.   Ricky Shields reports that he has been taking the Atorvastatin 20 mg tablet taking two per day with no issues. Sending in the new prescriptions for 40 mg tablet once per day. Ricky Shields is going to be seen in January by PCP team, at that time he will have a lipid panel and chemistry completed at that time. Patient was able to explain next steps. Discontinued Atorvastatin 20 mg tablet daily from patients chart.   Follow Up Plan: The patient has been provided with contact information for the care management team and has been advised to call with any health related questions or concerns.   SIGNATURE

## 2021-12-13 DIAGNOSIS — B002 Herpesviral gingivostomatitis and pharyngotonsillitis: Secondary | ICD-10-CM | POA: Diagnosis not present

## 2021-12-13 DIAGNOSIS — N400 Enlarged prostate without lower urinary tract symptoms: Secondary | ICD-10-CM | POA: Diagnosis not present

## 2021-12-13 DIAGNOSIS — E782 Mixed hyperlipidemia: Secondary | ICD-10-CM

## 2021-12-13 DIAGNOSIS — I10 Essential (primary) hypertension: Secondary | ICD-10-CM

## 2021-12-14 DIAGNOSIS — M48062 Spinal stenosis, lumbar region with neurogenic claudication: Secondary | ICD-10-CM | POA: Diagnosis not present

## 2021-12-16 ENCOUNTER — Other Ambulatory Visit: Payer: Self-pay | Admitting: Nurse Practitioner

## 2021-12-16 DIAGNOSIS — E119 Type 2 diabetes mellitus without complications: Secondary | ICD-10-CM

## 2021-12-25 ENCOUNTER — Other Ambulatory Visit: Payer: Self-pay | Admitting: Internal Medicine

## 2022-01-03 DIAGNOSIS — E119 Type 2 diabetes mellitus without complications: Secondary | ICD-10-CM | POA: Diagnosis not present

## 2022-01-03 DIAGNOSIS — H35363 Drusen (degenerative) of macula, bilateral: Secondary | ICD-10-CM | POA: Diagnosis not present

## 2022-01-03 DIAGNOSIS — H2513 Age-related nuclear cataract, bilateral: Secondary | ICD-10-CM | POA: Diagnosis not present

## 2022-01-03 DIAGNOSIS — H02054 Trichiasis without entropian left upper eyelid: Secondary | ICD-10-CM | POA: Diagnosis not present

## 2022-01-03 DIAGNOSIS — H0230 Blepharochalasis unspecified eye, unspecified eyelid: Secondary | ICD-10-CM | POA: Diagnosis not present

## 2022-01-03 DIAGNOSIS — H35033 Hypertensive retinopathy, bilateral: Secondary | ICD-10-CM | POA: Diagnosis not present

## 2022-01-24 ENCOUNTER — Encounter: Payer: Self-pay | Admitting: Nurse Practitioner

## 2022-01-24 ENCOUNTER — Ambulatory Visit (INDEPENDENT_AMBULATORY_CARE_PROVIDER_SITE_OTHER): Payer: PPO | Admitting: Nurse Practitioner

## 2022-01-24 VITALS — BP 124/70 | HR 98 | Temp 98.6°F | Ht 60.0 in | Wt 152.0 lb

## 2022-01-24 DIAGNOSIS — E782 Mixed hyperlipidemia: Secondary | ICD-10-CM | POA: Diagnosis not present

## 2022-01-24 DIAGNOSIS — Z79899 Other long term (current) drug therapy: Secondary | ICD-10-CM | POA: Diagnosis not present

## 2022-01-24 NOTE — Progress Notes (Signed)
I,[name],acting as a Education administrator for [employer], FNP.,have documented all relevant documentation on the behalf of [name], FNP,as directed by  [name], FNP while in the presence of [name], [place].  Subjective:     Patient ID: Ricky Shields , male    DOB: 12/08/1945 , 77 y.o.   MRN: [id number]   Chief Complaint  Patient presents with   Hyperlipidemia    HPI  Patient presents today to follow-up on increase of atorvastatin to 40 mg - he is taking in the morning. Denies any muscle aches, cramps or muscle weakness. Reports he drinks water all the time.      Past Medical History:  Diagnosis Date   Back pain    intermittent-prior military injury   BPH (benign prostatic hypertrophy) with urinary retention    Diabetes mellitus without complication (HCC)    GERD (gastroesophageal reflux disease)    High cholesterol    Hypertension      Family History  Problem Relation Age of Onset   Stroke Mother    Stroke Father      Current Outpatient Medications:    aspirin EC 81 MG tablet, Take 81 mg by mouth daily. Swallow whole., Disp: , Rfl:    atorvastatin (LIPITOR) 40 MG tablet, Take 1 tablet (40 mg total) by mouth daily., Disp: 90 tablet, Rfl: 1   cholecalciferol (VITAMIN D3) 25 MCG (1000 UNIT) tablet, Take 1,000 Units by mouth daily., Disp: , Rfl:    docusate sodium (COLACE) 100 MG capsule, Take 1 capsule (100 mg total) by mouth 2 (two) times daily., Disp: 30 capsule, Rfl: 0   esomeprazole (NEXIUM) 20 MG capsule, Take 20 mg by mouth daily as needed (acid reflux)., Disp: , Rfl:    glucose blood (ONETOUCH ULTRA) test strip, Use as instructed, Disp: 100 each, Rfl: 2   glucose blood test strip, Use as instructed to test blood sugars three times a day DX:E11.65, Disp: 100 each, Rfl: 12   JARDIANCE 10 MG TABS tablet, TAKE 1 TABLET(10 MG) BY MOUTH DAILY BEFORE BREAKFAST, Disp: 30 tablet, Rfl: 5   Lidocaine 4 % GEL, Apply 1 Application topically daily as needed (pain)., Disp:  , Rfl:    metFORMIN (GLUCOPHAGE) 1000 MG tablet, TAKE 1 TABLET(1000 MG) BY MOUTH TWICE DAILY WITH A MEAL, Disp: 180 tablet, Rfl: 1   Multiple Vitamin (MULTIVITAMIN WITH MINERALS) TABS tablet, Take 1 tablet by mouth daily. One a Day Men's, Disp: , Rfl:    Multiple Vitamins-Minerals (PRESERVISION AREDS 2) CAPS, Take 1 capsule by mouth 2 (two) times daily., Disp: , Rfl:    naloxone (NARCAN) nasal spray 4 mg/0.1 mL, Place 1 spray into the nose as needed (opioid overdose)., Disp: , Rfl:    Olmesartan-amLODIPine-HCTZ 40-10-12.5 MG TABS, TAKE 1 TABLET BY MOUTH DAILY, Disp: 90 tablet, Rfl: 1   Omega-3 Fatty Acids (FISH OIL) 1200 MG CAPS, Take 1,200 mg by mouth daily., Disp: , Rfl:    spironolactone (ALDACTONE) 25 MG tablet, Take 0.5 tablets (12.5 mg total) by mouth daily., Disp: 45 tablet, Rfl: 1   tamsulosin (FLOMAX) 0.4 MG CAPS capsule, Take 0.4 mg by mouth daily., Disp: , Rfl: 5   Allergies  Allergen Reactions   Penicillins Rash    Has patient had a PCN reaction causing immediate rash, facial/tongue/throat swelling, SOB or lightheadedness with hypotension: No Has patient had a PCN reaction causing severe rash involving mucus membranes or skin necrosis: No Has patient had a PCN reaction that required hospitalization  No Has patient had a PCN reaction occurring within the last 10 years: No If all of the above answers are "NO", then may proceed with Cephalosporin use.      Review of Systems  Constitutional: Negative.   Respiratory: Negative.    Cardiovascular: Negative.   Gastrointestinal: Negative.   Neurological: Negative.      Today's Vitals   01/24/22 1551  BP: 124/70  Pulse: 98  Temp: 98.6 F (37 C)  TempSrc: Oral  Weight: 152 lb (68.9 kg)  Height: 5' (1.524 m)   Body mass index is 29.69 kg/m.   Objective:  Physical Exam Vitals reviewed.  Constitutional:      General: He is not in acute distress.    Appearance: Normal appearance.  Cardiovascular:     Rate and Rhythm:  Normal rate and regular rhythm.     Pulses: Normal pulses.     Heart sounds: Normal heart sounds. No murmur heard. Pulmonary:     Effort: Pulmonary effort is normal. No respiratory distress.     Breath sounds: Normal breath sounds. No wheezing.  Musculoskeletal:        General: No swelling. Normal range of motion.     Right lower leg: No edema.     Left lower leg: No edema.  Skin:    General: Skin is warm and dry.     Capillary Refill: Capillary refill takes less than 2 seconds.     Coloration: Skin is not jaundiced.  Neurological:     General: No focal deficit present.     Mental Status: He is alert and oriented to person, place, and time.     Cranial Nerves: No cranial nerve deficit.     Motor: No weakness.  Psychiatric:        Mood and Affect: Mood normal.        Behavior: Behavior normal.        Thought Content: Thought content normal.        Judgment: Judgment normal.         Assessment And Plan:     1. Mixed hyperlipidemia Comments: Tolerating increase in atorvastatin well, will check lipid panel and CMP. - Lipid panel - CMP14+EGFR  2. Other long term (current) drug therapy - Lipid panel - CMP14+EGFR     Patient was given opportunity to ask questions. Patient verbalized understanding of the plan and was able to repeat key elements of the plan. All questions were answered to their satisfaction.  [name], FNP   I, [name], FNP, have reviewed all documentation for this visit. The documentation on 01/24/22 for the exam, diagnosis, procedures, and orders are all accurate and complete.   IF YOU HAVE BEEN REFERRED TO A SPECIALIST, IT MAY TAKE 1-2 WEEKS TO SCHEDULE/PROCESS THE REFERRAL. IF YOU HAVE NOT HEARD FROM US/SPECIALIST IN TWO WEEKS, PLEASE GIVE [place] A CALL AT [phone] X [phone].   THE PATIENT IS ENCOURAGED TO PRACTICE SOCIAL DISTANCING DUE TO THE COVID-19 PANDEMIC.

## 2022-01-24 NOTE — Patient Instructions (Signed)

## 2022-01-25 LAB — CMP14+EGFR
ALT: 21 IU/L (ref 0–44)
AST: 19 IU/L (ref 0–40)
Albumin/Globulin Ratio: 1.7 (ref 1.2–2.2)
Albumin: 4.5 g/dL (ref 3.8–4.8)
Alkaline Phosphatase: 75 IU/L (ref 44–121)
BUN/Creatinine Ratio: 18 (ref 10–24)
BUN: 20 mg/dL (ref 8–27)
Bilirubin Total: 0.6 mg/dL (ref 0.0–1.2)
CO2: 20 mmol/L (ref 20–29)
Calcium: 10.4 mg/dL — ABNORMAL HIGH (ref 8.6–10.2)
Chloride: 101 mmol/L (ref 96–106)
Creatinine, Ser: 1.12 mg/dL (ref 0.76–1.27)
Globulin, Total: 2.6 g/dL (ref 1.5–4.5)
Glucose: 114 mg/dL — ABNORMAL HIGH (ref 70–99)
Potassium: 4.5 mmol/L (ref 3.5–5.2)
Sodium: 139 mmol/L (ref 134–144)
Total Protein: 7.1 g/dL (ref 6.0–8.5)
eGFR: 69 mL/min/{1.73_m2} (ref >59)

## 2022-01-25 LAB — LIPID PANEL
Chol/HDL Ratio: 2.5 ratio (ref 0.0–5.0)
Cholesterol, Total: 140 mg/dL (ref 100–199)
HDL: 57 mg/dL (ref >39)
LDL Chol Calc (NIH): 65 mg/dL (ref 0–99)
Triglycerides: 94 mg/dL (ref 0–149)
VLDL Cholesterol Cal: 18 mg/dL (ref 5–40)

## 2022-02-15 ENCOUNTER — Ambulatory Visit: Payer: PPO

## 2022-02-15 NOTE — Progress Notes (Signed)
Care Management & Coordination Services Pharmacy Note  02/15/2022 Name:  Ricky Shields Ricky Shields:  [id number] DOB:  01-20-1945  Summary: Patient reports that he is doing well. No changes to be made to his current medication regimen.   Recommendations/Changes made from today's visit: Recommend Mr. Ricky Shields continue current medication regimen    Follow up plan: Mr. Ricky Shields is going to increase the amount of vegetables he is eating daily.  Plan to get RSV vaccine at retail pharmacy    Subjective: Ricky Shields is an 77 y.o. year old male who is a primary patient of [name], [place].  The care coordination team was consulted for assistance with disease management and care coordination needs.    Engaged with patient by telephone for follow up visit.  Recent office visits: 01/24/2022 PC OV 11/19/2021 PCP [facility] visits: None in previous 6 months   Objective:  Lab Results  Component Value Date   CREATININE 1.12 01/24/2022   BUN 20 01/24/2022   EGFR 69 01/24/2022   GFRNONAA 60 01/10/2020   GFRAA 70 01/10/2020   NA 139 01/24/2022   K 4.5 01/24/2022   CALCIUM 10.4 (H) 01/24/2022   CO2 20 01/24/2022   GLUCOSE 114 (H) 01/24/2022    Lab Results  Component Value Date/Time   HGBA1C 6.4 (H) 11/19/2021 11:40 AM   HGBA1C 6.9 (H) 07/18/2021 11:44 AM   MICROALBUR 30 07/08/2019 12:51 PM   MICROALBUR 80 06/25/2018 12:39 PM    Last diabetic Eye exam:  Lab Results  Component Value Date/Time   HMDIABEYEEXA No Retinopathy 01/02/2021 12:00 AM    Last diabetic Foot exam: No results found for: "HMDIABFOOTEX"   Lab Results  Component Value Date   CHOL 140 01/24/2022   HDL 57 01/24/2022   LDLCALC 65 01/24/2022   TRIG 94 01/24/2022   CHOLHDL 2.5 01/24/2022       Latest Ref Rng & Units 01/24/2022    4:19 PM 07/18/2021   11:44 AM 11/16/2020   11:08 AM  Hepatic Function  Total Protein 6.0 - 8.5 g/dL 7.1  7.2  7.2   Albumin 3.8 - 4.8 g/dL 4.5  4.6  4.7   AST 0 - 40 IU/L '19  19  20    '$ ALT 0 - 44 IU/L '21  18  17   '$ Alk Phosphatase 44 - 121 IU/L 75  83  94   Total Bilirubin 0.0 - 1.2 mg/dL 0.6  0.5  0.6     Lab Results  Component Value Date/Time   TSH 1.510 07/18/2021 11:44 AM       Latest Ref Rng & Units 07/18/2021   11:44 AM 07/12/2020   10:39 AM 07/08/2019    2:07 PM  CBC  WBC 3.4 - 10.8 x10E3/uL 6.1  5.9  6.4   Hemoglobin 13.0 - 17.7 g/dL 15.1  14.9  14.3   Hematocrit 37.5 - 51.0 % 44.1  43.2  39.8   Platelets 150 - 450 x10E3/uL 279  262  250     No results found for: "VD25OH", "VITAMINB12"  Clinical ASCVD: No  The 10-year ASCVD risk score (Arnett DK, et al., 2019) is: 32.9%   Values used to calculate the score:     Age: 77 years     Sex: Male     Is Non-Hispanic African American: Yes     Diabetic: Yes     Tobacco smoker: No     Systolic Blood Pressure: 712 mmHg  Is BP treated: Yes     HDL Cholesterol: 57 mg/dL     Total Cholesterol: 140 mg/dL        11/19/2021   10:51 AM 08/09/2021    2:34 PM 07/18/2021   10:31 AM  Depression screen PHQ 2/9  Decreased Interest 0 0 0  Down, Depressed, Hopeless 0 0 0  PHQ - 2 Score 0 0 0     Social History   Tobacco Use  Smoking Status Never  Smokeless Tobacco Never   BP Readings from Last 3 Encounters:  01/24/22 124/70  11/19/21 138/80  08/17/21 135/71   Pulse Readings from Last 3 Encounters:  01/24/22 98  11/19/21 86  08/17/21 77   Wt Readings from Last 3 Encounters:  01/24/22 152 lb (68.9 kg)  11/19/21 152 lb (68.9 kg)  08/16/21 152 lb 1.9 oz (69 kg)   BMI Readings from Last 3 Encounters:  01/24/22 29.69 kg/m  11/19/21 24.53 kg/m  08/16/21 24.55 kg/m    Allergies  Allergen Reactions   Penicillins Rash    Has patient had a PCN reaction causing immediate rash, facial/tongue/throat swelling, SOB or lightheadedness with hypotension: No Has patient had a PCN reaction causing severe rash involving mucus membranes or skin necrosis: No Has patient had a PCN reaction that required  hospitalization No Has patient had a PCN reaction occurring within the last 10 years: No If all of the above answers are "NO", then may proceed with Cephalosporin use.     Medications Reviewed Today     Reviewed by [name], FNP (Family Nurse Practitioner) on 01/24/22 at 1603  Med List Status: <None>   Medication Order Taking? Sig Documenting Provider Last Dose Status Informant  aspirin EC 81 MG tablet [id number] No Take 81 mg by mouth daily. Swallow whole. [provider] Taking Active Self  atorvastatin (LIPITOR) 40 MG tablet [id number]  Take 1 tablet (40 mg total) by mouth daily. [name], FNP  Active   cholecalciferol (VITAMIN D3) 25 MCG (1000 UNIT) tablet [id number] No Take 1,000 Units by mouth daily. [provider] Taking Active Self  docusate sodium (COLACE) 100 MG capsule [id number] No Take 1 capsule (100 mg total) by mouth 2 (two) times daily. Newman Pies, MD Taking Active   esomeprazole (NEXIUM) 20 MG capsule [id number] No Take 20 mg by mouth daily as needed (acid reflux). [provider] Taking Active Self  glucose blood (ONETOUCH ULTRA) test strip [id number] No Use as instructed [name], FNP Taking Active Self  glucose blood test strip [id number] No Use as instructed to test blood sugars three times a day DX:E11.65 [name], FNP Taking Active Self  JARDIANCE 10 MG TABS tablet [id number] No TAKE 1 TABLET(10 MG) BY MOUTH DAILY BEFORE Evelena Peat, FNP Taking Active   Lidocaine 4 % GEL [id number] No Apply 1 Application topically daily as needed (pain). [provider] Taking Active Self  metFORMIN (GLUCOPHAGE) 1000 MG tablet [id number]  TAKE 1 TABLET(1000 MG) BY MOUTH TWICE DAILY WITH A MEAL [name], FNP  Active   Multiple Vitamin (MULTIVITAMIN WITH MINERALS) TABS tablet [id number] No Take 1 tablet by mouth daily. One a Day Men's [provider] Taking Active Self  Multiple Vitamins-Minerals (PRESERVISION  AREDS 2) CAPS [id number] No Take 1 capsule by mouth 2 (two) times daily. [provider] Taking Active Self  naloxone [facility]) nasal spray 4 mg/0.1 mL [id number] No Place 1 spray into the nose as needed (opioid overdose). [provider] Taking  Active Self  Olmesartan-amLODIPine-HCTZ 40-10-12.5 MG TABS [id number]  TAKE 1 TABLET BY MOUTH DAILY [name], FNP  Active   Omega-3 Fatty Acids (FISH OIL) 1200 MG CAPS [id number] No Take 1,200 mg by mouth daily. [provider] Taking Active Self  spironolactone (ALDACTONE) 25 MG tablet [id number]  Take 0.5 tablets (12.5 mg total) by mouth daily. [name], FNP  Active   tamsulosin [facility]) 0.4 MG CAPS capsule [id number] No Take 0.4 mg by mouth daily. [provider] Taking Active Self           Med Note Valma Cava   CVU Jan 27, 2016 11:57 AM)              SDOH:  (Social Determinants of Health) assessments and interventions performed: No SDOH Interventions    Flowsheet Row Chronic Care Management from 10/07/2019 in [facility] Clinical Support from 07/08/2019 in [facility] Clinical Support from 06/25/2018 in [facility]  SDOH Interventions     Depression Interventions/Treatment  -- PHQ2-9 Score <4 Follow-up Not Indicated PHQ2-9 Score <4 Follow-up Not Indicated  Financial Strain Interventions Intervention Not Indicated -- --       Medication Assistance: None required.  Patient affirms current coverage meets needs.  Medication Access: Within the past 30 days, how often has patient missed a dose of medication? None Is a pillbox or other method used to improve adherence? No  Factors that may affect medication adherence? lack of understanding of disease management Are meds synced by current pharmacy? No  Are meds delivered by current pharmacy? No  Does patient experience delays in picking up  medications due to transportation concerns? No   [facility] Services Reviewed: Is patient disadvantaged to use [facility]?: Yes  Current Rx insurance plan: [employer]  Name and location of Current pharmacy:  [facility] [place], [place] - [address] AT [address] [place] [address] Phone: [phone] Fax: [phone]  [facility] services reviewed with patient today?: No  Patient requests to transfer care to [facility]?: No  Reason patient declined to change pharmacies: Concern over cost/pricing differences  Compliance/Adherence/Medication fill history: Care Gaps: 12/2021-  Patient had eye exam, seen by Dr. Felizardo Hoffmann Drugs: Atorvastatin 40 mg tablet  Jardiance 10 mg tablet    Assessment/Plan   Hyperlipidemia: (LDL goal < 70) -Controlled -Current treatment: Atorvastatin 40 mg tablet once per day Appropriate, Effective, Safe, Accessible -Current dietary patterns: He has changed his eating habits and he is drinking more water. He is also going to increase his vegetables.  -Current exercise habits: The cold has stopped from him walking  -Educated on Cholesterol goals;  Importance of limiting foods high in cholesterol; Exercise goal of 150 minutes per week; -Recommended to continue current medication  [name], CPP, PharmD Clinical Pharmacist Practitioner [facility] [phone]

## 2022-02-20 ENCOUNTER — Telehealth: Payer: Self-pay

## 2022-02-20 ENCOUNTER — Ambulatory Visit: Payer: PPO

## 2022-02-27 NOTE — Progress Notes (Signed)
Care Management & Coordination Services Pharmacy Note  02/27/2022 Name:  Ricky Shields MRN:  [id number] DOB:  Oct 22, 1945  Summary: Patient reports that he is doing well. No changes to be made to his current medication regimen.    Recommendations/Changes made from today's visit: Recommend Ricky Shields continue current medication regimen       Follow up plan: Ricky Shields is going to increase the amount of vegetables he is eating daily.  Plan to get RSV vaccine at retail pharmacy      Subjective: Ricky Shields is an 77 y.o. year old male who is a primary patient of [name], [place].  The care coordination team was consulted for assistance with disease management and care coordination needs.     Engaged with patient by telephone for follow up visit.   Recent office visits: 01/24/2022 PC OV 11/19/2021 PCP [facility] visits: None in previous 6 months   Objective:  Lab Results  Component Value Date   CREATININE 1.12 01/24/2022   BUN 20 01/24/2022   EGFR 69 01/24/2022   GFRNONAA 60 01/10/2020   GFRAA 70 01/10/2020   NA 139 01/24/2022   K 4.5 01/24/2022   CALCIUM 10.4 (H) 01/24/2022   CO2 20 01/24/2022   GLUCOSE 114 (H) 01/24/2022    Lab Results  Component Value Date/Time   HGBA1C 6.4 (H) 11/19/2021 11:40 AM   HGBA1C 6.9 (H) 07/18/2021 11:44 AM   MICROALBUR 30 07/08/2019 12:51 PM   MICROALBUR 80 06/25/2018 12:39 PM    Last diabetic Eye exam:  Lab Results  Component Value Date/Time   HMDIABEYEEXA No Retinopathy 01/02/2021 12:00 AM    Last diabetic Foot exam: No results found for: "HMDIABFOOTEX"   Lab Results  Component Value Date   CHOL 140 01/24/2022   HDL 57 01/24/2022   LDLCALC 65 01/24/2022   TRIG 94 01/24/2022   CHOLHDL 2.5 01/24/2022       Latest Ref Rng & Units 01/24/2022    4:19 PM 07/18/2021   11:44 AM 11/16/2020   11:08 AM  Hepatic Function  Total Protein 6.0 - 8.5 g/dL 7.1  7.2  7.2   Albumin 3.8 - 4.8 g/dL 4.5  4.6  4.7   AST 0 - 40 IU/L  19  19  20   $ ALT 0 - 44 IU/L 21  18  17   $ Alk Phosphatase 44 - 121 IU/L 75  83  94   Total Bilirubin 0.0 - 1.2 mg/dL 0.6  0.5  0.6     Lab Results  Component Value Date/Time   TSH 1.510 07/18/2021 11:44 AM       Latest Ref Rng & Units 07/18/2021   11:44 AM 07/12/2020   10:39 AM 07/08/2019    2:07 PM  CBC  WBC 3.4 - 10.8 x10E3/uL 6.1  5.9  6.4   Hemoglobin 13.0 - 17.7 g/dL 15.1  14.9  14.3   Hematocrit 37.5 - 51.0 % 44.1  43.2  39.8   Platelets 150 - 450 x10E3/uL 279  262  250     No results found for: "VD25OH", "VITAMINB12"  Clinical ASCVD: No  The 10-year ASCVD risk score (Arnett DK, et al., 2019) is: 32.9%   Values used to calculate the score:     Age: 50 years     Sex: Male     Is Non-Hispanic African American: Yes     Diabetic: Yes     Tobacco smoker: No  Systolic Blood Pressure: [id number] mmHg     Is BP treated: Yes     HDL Cholesterol: 57 mg/dL     Total Cholesterol: 140 mg/dL        11/19/2021   10:51 AM 08/09/2021    2:34 PM 07/18/2021   10:31 AM  Depression screen PHQ 2/9  Decreased Interest 0 0 0  Down, Depressed, Hopeless 0 0 0  PHQ - 2 Score 0 0 0     Social History   Tobacco Use  Smoking Status Never  Smokeless Tobacco Never   BP Readings from Last 3 Encounters:  01/24/22 124/70  11/19/21 138/80  08/17/21 135/71   Pulse Readings from Last 3 Encounters:  01/24/22 98  11/19/21 86  08/17/21 77   Wt Readings from Last 3 Encounters:  01/24/22 152 lb (68.9 kg)  11/19/21 152 lb (68.9 kg)  08/16/21 152 lb 1.9 oz (69 kg)   BMI Readings from Last 3 Encounters:  01/24/22 29.69 kg/m  11/19/21 24.53 kg/m  08/16/21 24.55 kg/m    Allergies  Allergen Reactions   Penicillins Rash    Has patient had a PCN reaction causing immediate rash, facial/tongue/throat swelling, SOB or lightheadedness with hypotension: No Has patient had a PCN reaction causing severe rash involving mucus membranes or skin necrosis: No Has patient had a PCN reaction that  required hospitalization No Has patient had a PCN reaction occurring within the last 10 years: No If all of the above answers are "NO", then may proceed with Cephalosporin use.     Medications Reviewed Today     Reviewed by [name], FNP (Family Nurse Practitioner) on 01/24/22 at 1603  Med List Status: <None>   Medication Order Taking? Sig Documenting Provider Last Dose Status Informant  aspirin EC 81 MG tablet [id number] No Take 81 mg by mouth daily. Swallow whole. [provider] Taking Active Self  atorvastatin (LIPITOR) 40 MG tablet [id number]  Take 1 tablet (40 mg total) by mouth daily. [name], FNP  Active   cholecalciferol (VITAMIN D3) 25 MCG (1000 UNIT) tablet [id number] No Take 1,000 Units by mouth daily. [provider] Taking Active Self  docusate sodium (COLACE) 100 MG capsule [id number] No Take 1 capsule (100 mg total) by mouth 2 (two) times daily. Newman Pies, MD Taking Active   esomeprazole (NEXIUM) 20 MG capsule [id number] No Take 20 mg by mouth daily as needed (acid reflux). [provider] Taking Active Self  glucose blood (ONETOUCH ULTRA) test strip [id number] No Use as instructed [name], FNP Taking Active Self  glucose blood test strip [id number] No Use as instructed to test blood sugars three times a day DX:E11.65 [name], FNP Taking Active Self  JARDIANCE 10 MG TABS tablet [id number] No TAKE 1 TABLET(10 MG) BY MOUTH DAILY BEFORE Evelena Peat, FNP Taking Active   Lidocaine 4 % GEL 0000000 No Apply 1 Application topically daily as needed (pain). [provider] Taking Active Self  metFORMIN (GLUCOPHAGE) 1000 MG tablet [id number]  TAKE 1 TABLET(1000 MG) BY MOUTH TWICE DAILY WITH A MEAL [name], FNP  Active   Multiple Vitamin (MULTIVITAMIN WITH MINERALS) TABS tablet [id number] No Take 1 tablet by mouth daily. One a Day Men's [provider] Taking Active Self  Multiple Vitamins-Minerals  (PRESERVISION AREDS 2) CAPS [id number] No Take 1 capsule by mouth 2 (two) times daily. [provider] Taking Active Self  naloxone [facility]) nasal spray 4 mg/0.1 mL [id number] No Place 1 spray into the  nose as needed (opioid overdose). [provider] Taking Active Self  Olmesartan-amLODIPine-HCTZ 40-10-12.5 MG TABS [id number]  TAKE 1 TABLET BY MOUTH DAILY [name], FNP  Active   Omega-3 Fatty Acids (FISH OIL) 1200 MG CAPS [id number] No Take 1,200 mg by mouth daily. [provider] Taking Active Self  spironolactone (ALDACTONE) 25 MG tablet [id number]  Take 0.5 tablets (12.5 mg total) by mouth daily. [name], FNP  Active   tamsulosin [facility]) 0.4 MG CAPS capsule [id number] No Take 0.4 mg by mouth daily. [provider] Taking Active Self           Med Note Valma Cava   [id number] Jan 27, 2016 11:57 AM)              SDOH:  (Social Determinants of Health) assessments and interventions performed: No SDOH Interventions    Flowsheet Row Chronic Care Management from 10/07/2019 in [facility] Clinical Support from 07/08/2019 in [facility] Clinical Support from 06/25/2018 in [facility]  SDOH Interventions     Depression Interventions/Treatment  -- PHQ2-9 Score <4 Follow-up Not Indicated PHQ2-9 Score <4 Follow-up Not Indicated  Financial Strain Interventions Intervention Not Indicated -- --       Medication Assistance: None required.  Patient affirms current coverage meets needs.  Medication Access: Within the past 30 days, how often has patient missed a dose of medication? None Is a pillbox or other method used to improve adherence? No  Factors that may affect medication adherence? lack of understanding of disease management Are meds synced by current pharmacy? No  Are meds delivered by current pharmacy? No  Does patient experience delays in  picking up medications due to transportation concerns? No  [facility] Reviewed:  [facility] services reviewed with patient today?: No  Patient requests to transfer care to [facility]?: No  Reason patient declined to change pharmacies: Concern over cost/pricing differences   Compliance/Adherence/Medication fill history: Care Gaps: 12/2021-  Patient had eye exam, seen by Dr. Felizardo Hoffmann Drugs: Atorvastatin 40 mg tablet  Jardiance 10 mg tablet      Assessment/Plan     Hyperlipidemia: (LDL goal < 70) -Controlled -Current treatment: Atorvastatin 40 mg tablet once per day Appropriate, Effective, Safe, Accessible -Current dietary patterns: He has changed his eating habits and he is drinking more water. He is also going to increase his vegetables.  -Current exercise habits: The cold has stopped from him walking  -Educated on Cholesterol goals;  Importance of limiting foods high in cholesterol; Exercise goal of 150 minutes per week; -Recommended to continue current medication   [name], CPP, PharmD Clinical Pharmacist Practitioner [facility] [phone]

## 2022-03-20 ENCOUNTER — Encounter: Payer: Self-pay | Admitting: Nurse Practitioner

## 2022-03-20 ENCOUNTER — Ambulatory Visit (INDEPENDENT_AMBULATORY_CARE_PROVIDER_SITE_OTHER): Payer: PPO | Admitting: Nurse Practitioner

## 2022-03-20 VITALS — BP 122/58 | HR 90 | Temp 98.4°F | Ht 69.0 in | Wt 153.0 lb

## 2022-03-20 DIAGNOSIS — E11649 Type 2 diabetes mellitus with hypoglycemia without coma: Secondary | ICD-10-CM | POA: Diagnosis not present

## 2022-03-20 DIAGNOSIS — E1169 Type 2 diabetes mellitus with other specified complication: Secondary | ICD-10-CM | POA: Insufficient documentation

## 2022-03-20 DIAGNOSIS — I10 Essential (primary) hypertension: Secondary | ICD-10-CM | POA: Diagnosis not present

## 2022-03-20 DIAGNOSIS — E785 Hyperlipidemia, unspecified: Secondary | ICD-10-CM | POA: Diagnosis not present

## 2022-03-20 DIAGNOSIS — E782 Mixed hyperlipidemia: Secondary | ICD-10-CM | POA: Insufficient documentation

## 2022-03-20 DIAGNOSIS — M5116 Intervertebral disc disorders with radiculopathy, lumbar region: Secondary | ICD-10-CM | POA: Insufficient documentation

## 2022-03-20 DIAGNOSIS — Z1211 Encounter for screening for malignant neoplasm of colon: Secondary | ICD-10-CM | POA: Insufficient documentation

## 2022-03-20 DIAGNOSIS — K625 Hemorrhage of anus and rectum: Secondary | ICD-10-CM | POA: Insufficient documentation

## 2022-03-20 DIAGNOSIS — K5900 Constipation, unspecified: Secondary | ICD-10-CM | POA: Insufficient documentation

## 2022-03-20 DIAGNOSIS — B351 Tinea unguium: Secondary | ICD-10-CM | POA: Diagnosis not present

## 2022-03-20 NOTE — Progress Notes (Addendum)
I,Sheena H Holbrook,acting as a Neurosurgeon for Arnette Felts, FNP.,have documented all relevant documentation on the behalf of Arnette Felts, FNP,as directed by  Arnette Felts, FNP while in the presence of Arnette Felts, FNP.    Subjective:     Patient ID: Ricky Shields , male    DOB: June 15, 1945 , 77 y.o.   MRN: [id number]   Chief Complaint  Patient presents with   Medication Management    statin    HPI  Patient presents today for follow up on statin medication. Patient denies any side effects from statin. Patient has no other complaints or concerns.     Diabetes He presents for his follow-up diabetic visit. He has type 2 diabetes mellitus. His disease course has been stable. There are no hypoglycemic associated symptoms. Pertinent negatives for hypoglycemia include no dizziness or headaches. Pertinent negatives for diabetes include no blurred vision, no chest pain, no fatigue, no polydipsia, no polyphagia and no polyuria. There are no hypoglycemic complications. Symptoms are stable. (hyperlipidemia) Risk factors for coronary artery disease include male sex and sedentary lifestyle. Current diabetic treatment includes oral agent (monotherapy). He is compliant with treatment all of the time. His weight is stable. He is following a diabetic diet. When asked about meal planning, he reported none. He has not had a previous visit with a dietitian. He participates in exercise intermittently. (Blood sugar average 78) An ACE inhibitor/angiotensin II receptor blocker is being taken. He does not see a podiatrist.Eye exam is current.  Hypertension This is a chronic problem. The current episode started more than 1 year ago. The problem is unchanged. The problem is controlled. Pertinent negatives include no anxiety, blurred vision, chest pain, headaches or palpitations. There are no associated agents to hypertension. Risk factors for coronary artery disease include sedentary lifestyle and male gender. Past  treatments include angiotensin blockers and diuretics. The current treatment provides no improvement. There are no compliance problems.  There is no history of angina. There is no history of chronic renal disease.     Past Medical History:  Diagnosis Date   Back pain    intermittent-prior military injury   BPH (benign prostatic hypertrophy) with urinary retention    Diabetes mellitus without complication (HCC)    GERD (gastroesophageal reflux disease)    High cholesterol    Hypertension      Family History  Problem Relation Age of Onset   Stroke Mother    Stroke Father      Current Outpatient Medications:    aspirin EC 81 MG tablet, Take 81 mg by mouth daily. Swallow whole., Disp: , Rfl:    atorvastatin (LIPITOR) 40 MG tablet, Take 1 tablet (40 mg total) by mouth daily., Disp: 90 tablet, Rfl: 1   cholecalciferol (VITAMIN D3) 25 MCG (1000 UNIT) tablet, Take 1,000 Units by mouth daily., Disp: , Rfl:    docusate sodium (COLACE) 100 MG capsule, Take 1 capsule (100 mg total) by mouth 2 (two) times daily., Disp: 30 capsule, Rfl: 0   esomeprazole (NEXIUM) 20 MG capsule, Take 20 mg by mouth daily as needed (acid reflux)., Disp: , Rfl:    glucose blood (ONETOUCH ULTRA) test strip, Use as instructed, Disp: 100 each, Rfl: 2   glucose blood test strip, Use as instructed to test blood sugars three times a day DX:E11.65, Disp: 100 each, Rfl: 12   JARDIANCE 10 MG TABS tablet, TAKE 1 TABLET(10 MG) BY MOUTH DAILY BEFORE BREAKFAST, Disp: 30 tablet, Rfl: 5   Lidocaine 4 %  GEL, Apply 1 Application topically daily as needed (pain)., Disp: , Rfl:    metFORMIN (GLUCOPHAGE) 1000 MG tablet, TAKE 1 TABLET(1000 MG) BY MOUTH TWICE DAILY WITH A MEAL, Disp: 180 tablet, Rfl: 1   Multiple Vitamin (MULTIVITAMIN WITH MINERALS) TABS tablet, Take 1 tablet by mouth daily. One a Day Men's, Disp: , Rfl:    Multiple Vitamins-Minerals (PRESERVISION AREDS 2) CAPS, Take 1 capsule by mouth 2 (two) times daily., Disp: , Rfl:     naloxone (NARCAN) nasal spray 4 mg/0.1 mL, Place 1 spray into the nose as needed (opioid overdose)., Disp: , Rfl:    Olmesartan-amLODIPine-HCTZ 40-10-12.5 MG TABS, TAKE 1 TABLET BY MOUTH DAILY, Disp: 90 tablet, Rfl: 1   Omega-3 Fatty Acids (FISH OIL) 1200 MG CAPS, Take 1,200 mg by mouth daily., Disp: , Rfl:    spironolactone (ALDACTONE) 25 MG tablet, Take 0.5 tablets (12.5 mg total) by mouth daily., Disp: 45 tablet, Rfl: 1   tamsulosin (FLOMAX) 0.4 MG CAPS capsule, Take 0.4 mg by mouth daily., Disp: , Rfl: 5   valACYclovir (VALTREX) 500 MG tablet, Take 500 mg by mouth daily., Disp: , Rfl:    tamsulosin (FLOMAX) 0.4 MG CAPS capsule, Take 1 capsule (0.4 mg total) by mouth daily., Disp: 30 capsule, Rfl: 0   Allergies  Allergen Reactions   Penicillins Rash    Has patient had a PCN reaction causing immediate rash, facial/tongue/throat swelling, SOB or lightheadedness with hypotension: No Has patient had a PCN reaction causing severe rash involving mucus membranes or skin necrosis: No Has patient had a PCN reaction that required hospitalization No Has patient had a PCN reaction occurring within the last 10 years: No If all of the above answers are "NO", then may proceed with Cephalosporin use.      Review of Systems  Constitutional: Negative.  Negative for fatigue.  Eyes:  Negative for blurred vision.  Respiratory: Negative.    Cardiovascular: Negative.  Negative for chest pain and palpitations.  Gastrointestinal: Negative.   Endocrine: Negative for polydipsia, polyphagia and polyuria.  Neurological: Negative.  Negative for dizziness and headaches.     Today's Vitals   03/20/22 1110  BP: (!) 122/58  Pulse: 90  Temp: 98.4 F (36.9 C)  TempSrc: Oral  SpO2: 97%  Weight: 153 lb (69.4 kg)  Height: 5\' 9"  (1.753 m)   Body mass index is 22.59 kg/m.   Objective:  Physical Exam Vitals reviewed.  Constitutional:      General: He is not in acute distress.    Appearance: Normal  appearance.  Cardiovascular:     Rate and Rhythm: Normal rate and regular rhythm.     Pulses: Normal pulses.     Heart sounds: Normal heart sounds. No murmur heard. Pulmonary:     Effort: Pulmonary effort is normal. No respiratory distress.     Breath sounds: Normal breath sounds. No wheezing.  Musculoskeletal:        General: No swelling. Normal range of motion.     Right lower leg: No edema.     Left lower leg: No edema.  Skin:    General: Skin is warm and dry.     Capillary Refill: Capillary refill takes less than 2 seconds.     Coloration: Skin is not jaundiced.  Neurological:     General: No focal deficit present.     Mental Status: He is alert and oriented to person, place, and time.     Cranial Nerves: No cranial nerve deficit.  Motor: No weakness.  Psychiatric:        Mood and Affect: Mood normal.        Behavior: Behavior normal.        Thought Content: Thought content normal.        Judgment: Judgment normal.         Assessment And Plan:     1. Type 2 diabetes mellitus with hyperlipidemia (HCC) Comments: HgbA1c was improved at last visit, continue current medications.  Diabetic foot exam done, normal. Well controlled, continue current medications - Hemoglobin A1c  2. Essential hypertension Comments: Blood pressure is well controlled.  3. Toenail fungus Comments: Yellow discoloration to toenails, advised to get over the counter antifungal foot spray     Patient was given opportunity to ask questions. Patient verbalized understanding of the plan and was able to repeat key elements of the plan. All questions were answered to their satisfaction.  Arnette Felts, FNP   I, Arnette Felts, FNP, have reviewed all documentation for this visit. The documentation on 03/20/22 for the exam, diagnosis, procedures, and orders are all accurate and complete.   IF YOU HAVE BEEN REFERRED TO A SPECIALIST, IT MAY TAKE 1-2 WEEKS TO SCHEDULE/PROCESS THE REFERRAL. IF YOU HAVE NOT  HEARD FROM US/SPECIALIST IN TWO WEEKS, PLEASE GIVE [place] A CALL AT [phone] X [phone].   THE PATIENT IS ENCOURAGED TO PRACTICE SOCIAL DISTANCING DUE TO THE COVID-19 PANDEMIC.

## 2022-03-20 NOTE — Patient Instructions (Signed)
-   you can get over the counter antifungal foot spray for your toenails

## 2022-03-21 LAB — HEMOGLOBIN A1C
Est. average glucose Bld gHb Est-mCnc: 143 mg/dL
Hgb A1c MFr Bld: 6.6 % — ABNORMAL HIGH (ref 4.8–5.6)

## 2022-03-27 ENCOUNTER — Encounter (HOSPITAL_BASED_OUTPATIENT_CLINIC_OR_DEPARTMENT_OTHER): Payer: Self-pay

## 2022-03-27 ENCOUNTER — Emergency Department (HOSPITAL_BASED_OUTPATIENT_CLINIC_OR_DEPARTMENT_OTHER): Payer: PPO

## 2022-03-27 ENCOUNTER — Other Ambulatory Visit: Payer: Self-pay

## 2022-03-27 ENCOUNTER — Emergency Department (HOSPITAL_BASED_OUTPATIENT_CLINIC_OR_DEPARTMENT_OTHER)
Admission: EM | Admit: 2022-03-27 | Discharge: 2022-03-28 | Disposition: A | Discharge status: Home / Self Care | Payer: PPO | Attending: Emergency Medicine | Admitting: Emergency Medicine

## 2022-03-27 DIAGNOSIS — N3289 Other specified disorders of bladder: Secondary | ICD-10-CM

## 2022-03-27 DIAGNOSIS — E119 Type 2 diabetes mellitus without complications: Secondary | ICD-10-CM | POA: Insufficient documentation

## 2022-03-27 DIAGNOSIS — Z7982 Long term (current) use of aspirin: Secondary | ICD-10-CM | POA: Diagnosis not present

## 2022-03-27 DIAGNOSIS — N401 Enlarged prostate with lower urinary tract symptoms: Secondary | ICD-10-CM | POA: Insufficient documentation

## 2022-03-27 DIAGNOSIS — N320 Bladder-neck obstruction: Secondary | ICD-10-CM | POA: Diagnosis not present

## 2022-03-27 DIAGNOSIS — D494 Neoplasm of unspecified behavior of bladder: Secondary | ICD-10-CM | POA: Diagnosis not present

## 2022-03-27 DIAGNOSIS — N2889 Other specified disorders of kidney and ureter: Secondary | ICD-10-CM

## 2022-03-27 DIAGNOSIS — I10 Essential (primary) hypertension: Secondary | ICD-10-CM | POA: Diagnosis not present

## 2022-03-27 DIAGNOSIS — Z7984 Long term (current) use of oral hypoglycemic drugs: Secondary | ICD-10-CM | POA: Diagnosis not present

## 2022-03-27 DIAGNOSIS — R310 Gross hematuria: Secondary | ICD-10-CM | POA: Diagnosis not present

## 2022-03-27 DIAGNOSIS — Z79899 Other long term (current) drug therapy: Secondary | ICD-10-CM | POA: Diagnosis not present

## 2022-03-27 DIAGNOSIS — E279 Disorder of adrenal gland, unspecified: Secondary | ICD-10-CM | POA: Insufficient documentation

## 2022-03-27 DIAGNOSIS — R319 Hematuria, unspecified: Secondary | ICD-10-CM | POA: Diagnosis present

## 2022-03-27 DIAGNOSIS — N400 Enlarged prostate without lower urinary tract symptoms: Secondary | ICD-10-CM

## 2022-03-27 NOTE — ED Triage Notes (Signed)
Pt to ED by POV from home with c/o hematuria which began this morning. Pt states he has had previous prostate surgery (2020). Pt endorses bright red blood in his urine. Arrives A+O, VSS, NADN.

## 2022-03-28 DIAGNOSIS — N320 Bladder-neck obstruction: Secondary | ICD-10-CM | POA: Diagnosis not present

## 2022-03-28 LAB — URINALYSIS, ROUTINE W REFLEX MICROSCOPIC: pH: 5.5 (ref 5.0–8.0)

## 2022-03-28 LAB — BASIC METABOLIC PANEL
Anion gap: 10 (ref 5–15)
BUN: 15 mg/dL (ref 8–23)
CO2: 23 mmol/L (ref 22–32)
Calcium: 10.3 mg/dL (ref 8.9–10.3)
Chloride: 101 mmol/L (ref 98–111)
Creatinine, Ser: 1.04 mg/dL (ref 0.61–1.24)
GFR, Estimated: >60 mL/min (ref >60)
Glucose, Bld: 229 mg/dL — ABNORMAL HIGH (ref 70–99)
Potassium: 3.8 mmol/L (ref 3.5–5.1)
Sodium: 134 mmol/L — ABNORMAL LOW (ref 135–145)

## 2022-03-28 LAB — CBC WITH DIFFERENTIAL/PLATELET
Abs Immature Granulocytes: 0.02 10*3/uL (ref 0.00–0.07)
Basophils Absolute: 0 10*3/uL (ref 0.0–0.1)
Basophils Relative: 0 %
Eosinophils Absolute: 0.1 10*3/uL (ref 0.0–0.5)
Eosinophils Relative: 2 %
HCT: 38.2 % — ABNORMAL LOW (ref 39.0–52.0)
Hemoglobin: 13.3 g/dL (ref 13.0–17.0)
Immature Granulocytes: 0 %
Lymphocytes Relative: 15 %
Lymphs Abs: 1.1 10*3/uL (ref 0.7–4.0)
MCH: 33.7 pg (ref 26.0–34.0)
MCHC: 34.8 g/dL (ref 30.0–36.0)
MCV: 96.7 fL (ref 80.0–100.0)
Monocytes Absolute: 0.6 10*3/uL (ref 0.1–1.0)
Monocytes Relative: 9 %
Neutro Abs: 5.3 10*3/uL (ref 1.7–7.7)
Neutrophils Relative %: 74 %
Platelets: 230 10*3/uL (ref 150–400)
RBC: 3.95 MIL/uL — ABNORMAL LOW (ref 4.22–5.81)
RDW: 11.9 % (ref 11.5–15.5)
WBC: 7.2 10*3/uL (ref 4.0–10.5)
nRBC: 0 % (ref 0.0–0.2)

## 2022-03-28 LAB — URINALYSIS, MICROSCOPIC (REFLEX): RBC / HPF: >50 RBC/hpf (ref 0–5)

## 2022-03-28 MED ORDER — SULFAMETHOXAZOLE-TRIMETHOPRIM 800-160 MG PO TABS: 1.0000 | ORAL_TABLET | Freq: Two times a day (BID) | ORAL | 0 refills | Status: AC

## 2022-03-28 MED ORDER — TAMSULOSIN HCL 0.4 MG PO CAPS: 0.4000 mg | ORAL_CAPSULE | Freq: Every day | ORAL | 0 refills | Status: AC

## 2022-03-28 MED ORDER — SULFAMETHOXAZOLE-TRIMETHOPRIM 800-160 MG PO TABS
1.0000 | ORAL_TABLET | Freq: Once | ORAL | Status: AC
  Administered 2022-03-28: 1 via ORAL
  Filled 2022-03-28: qty 1

## 2022-03-28 MED ORDER — TAMSULOSIN HCL 0.4 MG PO CAPS
0.4000 mg | ORAL_CAPSULE | ORAL | Status: AC
  Administered 2022-03-28: 0.4 mg via ORAL
  Filled 2022-03-28: qty 1

## 2022-03-28 NOTE — ED Notes (Signed)
CBI continues. RN remains at bedside as does wife. Denies pain or discomfort. Respirations are equal and nonlabored. Skin warm and dry.

## 2022-03-28 NOTE — ED Notes (Signed)
Standard drainage bag changed to leg bag per MD. Education provided. Pt and wife at bedside verbalized and demonstrated understanding.

## 2022-03-28 NOTE — ED Provider Notes (Signed)
Ricky Shields   CSN: [id number] Arrival date & time: 03/27/22  2314     History  Chief Complaint  Patient presents with   Hematuria    Ricky Shields is a 77 y.o. male.  The history is provided by the patient.  Hematuria This is a new problem. The problem occurs constantly. The problem has not changed since onset.Pertinent negatives include no chest pain, no headaches and no shortness of breath. Nothing aggravates the symptoms. Nothing relieves the symptoms. He has tried nothing for the symptoms. The treatment provided no relief.  Patient with BPH who is seen annually by Dr. [name] who presents with sudden onset painless gross hematuria and difficulty urinating this evening with passage of clots.      Past Medical History:  Diagnosis Date   Back pain    intermittent-prior military injury   BPH (benign prostatic hypertrophy) with urinary retention    Diabetes mellitus without complication (HCC)    GERD (gastroesophageal reflux disease)    High cholesterol    Hypertension      Home Medications Prior to Admission medications   Medication Sig Start Date End Date Taking? Authorizing Provider  aspirin EC 81 MG tablet Take 81 mg by mouth daily. Swallow whole.    [provider]  atorvastatin (LIPITOR) 40 MG tablet Take 1 tablet (40 mg total) by mouth daily. 12/12/21   [name], FNP  cholecalciferol (VITAMIN D3) 25 MCG (1000 UNIT) tablet Take 1,000 Units by mouth daily.    [provider]  docusate sodium (COLACE) 100 MG capsule Take 1 capsule (100 mg total) by mouth 2 (two) times daily. 08/17/21   Newman Pies, MD  esomeprazole (NEXIUM) 20 MG capsule Take 20 mg by mouth daily as needed (acid reflux).    [provider]  glucose blood (ONETOUCH ULTRA) test strip Use as instructed 01/10/21   [name], FNP  glucose blood test strip Use as instructed to test blood sugars three times a day  DX:E11.65 10/25/19   [name], FNP  JARDIANCE 10 MG TABS tablet TAKE 1 TABLET(10 MG) BY MOUTH DAILY BEFORE BREAKFAST 11/12/21   [name], FNP  Lidocaine 4 % GEL Apply 1 Application topically daily as needed (pain).    [provider]  metFORMIN (GLUCOPHAGE) 1000 MG tablet TAKE 1 TABLET(1000 MG) BY MOUTH TWICE DAILY WITH A MEAL 12/17/21   [name], FNP  Multiple Vitamin (MULTIVITAMIN WITH MINERALS) TABS tablet Take 1 tablet by mouth daily. One a Day Men's    [provider]  Multiple Vitamins-Minerals (PRESERVISION AREDS 2) CAPS Take 1 capsule by mouth 2 (two) times daily.    [provider]  naloxone [facility]) nasal spray 4 mg/0.1 mL Place 1 spray into the nose as needed (opioid overdose).    [provider]  Olmesartan-amLODIPine-HCTZ 40-10-12.5 MG TABS TAKE 1 TABLET BY MOUTH DAILY 11/30/21   [name], FNP  Omega-3 Fatty Acids (FISH OIL) 1200 MG CAPS Take 1,200 mg by mouth daily.    [provider]  spironolactone (ALDACTONE) 25 MG tablet Take 0.5 tablets (12.5 mg total) by mouth daily. 11/19/21 05/18/22  [name], FNP  tamsulosin (FLOMAX) 0.4 MG CAPS capsule Take 0.4 mg by mouth daily. 11/20/15   [provider]  valACYclovir (VALTREX) 500 MG tablet Take 500 mg by mouth daily. 03/11/22   [provider]      Allergies    Penicillins    Review  of Systems   Review of Systems  Constitutional:  Negative for fever.  HENT:  Negative for facial swelling.   Respiratory:  Negative for shortness of breath.   Cardiovascular:  Negative for chest pain.  Genitourinary:  Positive for difficulty urinating and hematuria.  Neurological:  Negative for headaches.  All other systems reviewed and are negative.   Physical Exam Updated Vital Signs BP 137/84 (BP Location: Left Arm)   Pulse (!) 101   Temp 98.5 F (36.9 C) (Oral)   Resp 16   Ht '5\' 9"'$  (1.753 m)   Wt 72.6 kg   SpO2 100%   BMI 23.63 kg/m  Physical  Exam Vitals and nursing Shields reviewed.  Constitutional:      General: He is not in acute distress.    Appearance: He is well-developed. He is not diaphoretic.  HENT:     Head: Normocephalic and atraumatic.     Nose: Nose normal.  Eyes:     Conjunctiva/sclera: Conjunctivae normal.     Pupils: Pupils are equal, round, and reactive to light.  Cardiovascular:     Rate and Rhythm: Normal rate and regular rhythm.  Pulmonary:     Effort: Pulmonary effort is normal.     Breath sounds: Normal breath sounds. No wheezing or rales.  Abdominal:     General: Bowel sounds are normal. There is distension.     Palpations: Abdomen is soft.     Tenderness: There is no abdominal tenderness. There is no guarding or rebound.  Musculoskeletal:        General: Normal range of motion.     Cervical back: Normal range of motion and neck supple.  Skin:    General: Skin is warm and dry.     Capillary Refill: Capillary refill takes less than 2 seconds.  Neurological:     General: No focal deficit present.     Mental Status: He is alert and oriented to person, place, and time.     Deep Tendon Reflexes: Reflexes normal.  Psychiatric:        Mood and Affect: Mood normal.        Behavior: Behavior normal.     ED Results / Procedures / Treatments   Labs (all labs ordered are listed, but only abnormal results are displayed) Results for orders placed or performed during the hospital encounter of 03/27/22  CBC with Differential  Result Value Ref Range   WBC 7.2 4.0 - 10.5 K/uL   RBC 3.95 (L) 4.22 - 5.81 MIL/uL   Hemoglobin 13.3 13.0 - 17.0 g/dL   HCT 38.2 (L) 39.0 - 52.0 %   MCV 96.7 80.0 - 100.0 fL   MCH 33.7 26.0 - 34.0 pg   MCHC 34.8 30.0 - 36.0 g/dL   RDW 11.9 11.5 - 15.5 %   Platelets 230 150 - 400 K/uL   nRBC 0.0 0.0 - 0.2 %   Neutrophils Relative % 74 %   Neutro Abs 5.3 1.7 - 7.7 K/uL   Lymphocytes Relative 15 %   Lymphs Abs 1.1 0.7 - 4.0 K/uL   Monocytes Relative 9 %   Monocytes Absolute  0.6 0.1 - 1.0 K/uL   Eosinophils Relative 2 %   Eosinophils Absolute 0.1 0.0 - 0.5 K/uL   Basophils Relative 0 %   Basophils Absolute 0.0 0.0 - 0.1 K/uL   Immature Granulocytes 0 %   Abs Immature Granulocytes 0.02 0.00 - 0.07 K/uL  Basic metabolic panel  Result Value Ref Range  Sodium 134 (L) 135 - 145 mmol/L   Potassium 3.8 3.5 - 5.1 mmol/L   Chloride 101 98 - 111 mmol/L   CO2 23 22 - 32 mmol/L   Glucose, Bld 229 (H) 70 - 99 mg/dL   BUN 15 8 - 23 mg/dL   Creatinine, Ser 1.04 0.61 - 1.24 mg/dL   Calcium 10.3 8.9 - 10.3 mg/dL   GFR, Estimated >60 >60 mL/min   Anion gap 10 5 - 15  Urinalysis, Routine w reflex microscopic -Urine, Clean Catch  Result Value Ref Range   Color, Urine RED (A) YELLOW   APPearance HAZY (A) CLEAR   Specific Gravity, Urine  1.005 - 1.030    TEST NOT REPORTED DUE TO COLOR INTERFERENCE OF URINE PIGMENT   pH 5.5 5.0 - 8.0   Glucose, UA (A) NEGATIVE mg/dL    TEST NOT REPORTED DUE TO COLOR INTERFERENCE OF URINE PIGMENT   Hgb urine dipstick (A) NEGATIVE    TEST NOT REPORTED DUE TO COLOR INTERFERENCE OF URINE PIGMENT   Bilirubin Urine (A) NEGATIVE    TEST NOT REPORTED DUE TO COLOR INTERFERENCE OF URINE PIGMENT   Ketones, ur (A) NEGATIVE mg/dL    TEST NOT REPORTED DUE TO COLOR INTERFERENCE OF URINE PIGMENT   Protein, ur (A) NEGATIVE mg/dL    TEST NOT REPORTED DUE TO COLOR INTERFERENCE OF URINE PIGMENT   Nitrite (A) NEGATIVE    TEST NOT REPORTED DUE TO COLOR INTERFERENCE OF URINE PIGMENT   Leukocytes,Ua (A) NEGATIVE    TEST NOT REPORTED DUE TO COLOR INTERFERENCE OF URINE PIGMENT  Urinalysis, Microscopic (reflex)  Result Value Ref Range   RBC / HPF >50 0 - 5 RBC/hpf   WBC, UA 21-50 0 - 5 WBC/hpf   Bacteria, UA RARE (A) NONE SEEN   Squamous Epithelial / HPF 0-5 0 - 5 /HPF   CT Renal Stone Study  Result Date: 03/27/2022 CLINICAL DATA:  Abdominal/flank pain. Stone suspected. Gross hematuria history of prostate surgery EXAM: CT ABDOMEN AND PELVIS WITHOUT  CONTRAST TECHNIQUE: Multidetector CT imaging of the abdomen and pelvis was performed following the standard protocol without IV contrast. RADIATION DOSE REDUCTION: This exam was performed according to the departmental dose-optimization program which includes automated exposure control, adjustment of the mA and/or kV according to patient size and/or use of iterative reconstruction technique. COMPARISON:  CT examination dated February 14, 2016 FINDINGS: Lower chest: No acute abnormality. Hepatobiliary: No focal liver abnormality is seen. No gallstones, gallbladder wall thickening, or biliary dilatation. Pancreas: Unremarkable. No pancreatic ductal dilatation or surrounding inflammatory changes. Spleen: Normal in size without focal abnormality. Adrenals/Urinary Tract: Enlarged left adrenal gland measuring 2.9 x 1.5 x 2.4 cm. There is a ectopic intrapelvic left kidney with dilated left renal pelvis. Right kidney is normal. There is a hyperdense structure in the posterior aspect of the urinary bladder measuring at least 8.5 x 4.9 x 4.5 cm, and appears to be separate from the enlarged prostate, highly suspicious for a neoplastic process. Stomach/Bowel: Stomach is within normal limits. Appendix not identified. No evidence of bowel wall thickening, distention, or inflammatory changes. Vascular/Lymphatic: Aortic atherosclerosis. No enlarged abdominal or pelvic lymph nodes. Reproductive: Prostate is enlarged measuring at least 5.8 x 5.7 x 5.0 cm Other: No abdominal wall hernia or abnormality. No abdominopelvic ascites. 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 IMPRESSION: 1. 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 2. There is a hyperdense  masslike structure in the posterior aspect of the urinary bladder measuring at least 8.5 x 4.9 x 4.5 cm, and appears to be separate from the enlarged prostate,  highly suspicious for a neoplastic process. Urology consultation and cystoscopy for further evaluation is suggested. Ductal looking at the CT scan patient Yuya Dellis 3. A is separate from the prostate is very dense compared to the prostate this is a noncontrast enhanced examination definitely he needs a urology consultation and cystoscopy is some kind of tumor thank you 4. Enlarged left adrenal gland measuring 2.9 x 1.5 x 2.4 cm. 5. 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 6. Aortic atherosclerosis. Urinary bladder mass highly suspicious for a neoplasm, urology consultation for further management was suggested. The above findings and recommendations were called by telephone at the time of interpretation on 03/27/2022 at 11:57 pm to provider [facility] , who verbally acknowledged these results. Electronically Signed   By: Keane Police D.O.   On: 03/27/2022 23:58     Radiology CT Renal Stone Study  Result Date: 03/27/2022 CLINICAL DATA:  Abdominal/flank pain. Stone suspected. Gross hematuria history of prostate surgery EXAM: CT ABDOMEN AND PELVIS WITHOUT CONTRAST TECHNIQUE: Multidetector CT imaging of the abdomen and pelvis was performed following the standard protocol without IV contrast. RADIATION DOSE REDUCTION: This exam was performed according to the departmental dose-optimization program which includes automated exposure control, adjustment of the mA and/or kV according to patient size and/or use of iterative reconstruction technique. COMPARISON:  CT examination dated February 14, 2016 FINDINGS: Lower chest: No acute abnormality. Hepatobiliary: No focal liver abnormality is seen. No gallstones, gallbladder wall thickening, or biliary dilatation. Pancreas: Unremarkable. No pancreatic ductal dilatation or surrounding inflammatory changes. Spleen: Normal in size without focal abnormality. Adrenals/Urinary Tract:  Enlarged left adrenal gland measuring 2.9 x 1.5 x 2.4 cm. There is a ectopic intrapelvic left kidney with dilated left renal pelvis. Right kidney is normal. There is a hyperdense structure in the posterior aspect of the urinary bladder measuring at least 8.5 x 4.9 x 4.5 cm, and appears to be separate from the enlarged prostate, highly suspicious for a neoplastic process. Stomach/Bowel: Stomach is within normal limits. Appendix not identified. No evidence of bowel wall thickening, distention, or inflammatory changes. Vascular/Lymphatic: Aortic atherosclerosis. No enlarged abdominal or pelvic lymph nodes. Reproductive: Prostate is enlarged measuring at least 5.8 x 5.7 x 5.0 cm Other: No abdominal wall hernia or abnormality. No abdominopelvic ascites. 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 IMPRESSION: 1. 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 2. There is a hyperdense masslike structure in the posterior aspect of the urinary bladder measuring at least 8.5 x 4.9 x 4.5 cm, and appears to be separate from the enlarged prostate, highly suspicious for a neoplastic process. Urology consultation and cystoscopy for further evaluation is suggested. Ductal looking at the CT scan patient Tjuan Meller 3. A is separate from the prostate is very dense compared to the prostate this is a noncontrast enhanced examination definitely he needs a urology consultation and cystoscopy is some kind of tumor thank you 4. Enlarged left adrenal gland measuring 2.9 x 1.5 x 2.4 cm. 5. 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 6. Aortic atherosclerosis. Urinary bladder mass highly suspicious for a neoplasm, urology consultation for further management was suggested. The above  findings and recommendations were called by telephone at the  time of interpretation on 03/27/2022 at 11:57 pm to provider [facility] , who verbally acknowledged these results. Electronically Signed   By: Keane Police D.O.   On: 03/27/2022 23:58    Procedures Procedures    Medications Ordered in ED Medications - No data to display  ED Course/ Medical Decision Making/ A&P                             Medical Decision Making Sudden onset gross painless hematuria this evening   Problems Addressed: Bladder mass: acute illness or injury    Details: Case d/w urology, foley placed irrigated and will be left in place follow up with urology.   Bladder obstruction: acute illness or injury    Details: Case d/w urology, foley placed irrigated and will be left in place follow up with urology.   Gross hematuria: acute illness or injury    Details: Case d/w urology, foley placed irrigated and will be left in place follow up with urology.   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    Amount and/or Complexity of Data Reviewed Labs: ordered.    Details: All labs reviewed:  Urine is grossly bloody.  Normal white count 7.2, hemoglobin normal 13.3, normal platelet count.  Sodium 134, normal potassium 3.8, normal creatinine 1.04.   Radiology: ordered and independent interpretation performed.    Details: Bladder mass on CT by me.  I was not called by radiology as CT reports indicate.  I have contacted Dr. Lilia Pro for this correction.   Discussion of management or test interpretation with external provider(s): Case d/w Dr. Wandalee Ferdinand of urology on call for Dr. [name].  Place 20 or larged CBI foley and irrigate to clear.  If creatinine is normal may discharge with foley and have patient call for office follow up.    Risk Prescription  drug management. Risk Details: Foley placed and irrigated until bladder effluent is nearly clear per discussion with urology.  I suspect patient will have some ongoing bloody or pink urine as the mass continues to bleed.  Will start antibiotics to cover in addition to flomax for spasm.  Patient and wife to call urology in the am for follow up of both foley catheter and bladder mass.  Take all antibiotics.  Will need to follow up with PMD for adrenal lesion.  All diagnoses informed verbally and in writing on discharge paperwork.  Stable for discharge with close follow up.  Strict return.      Final Clinical Impression(s) / ED Diagnoses Final diagnoses:  None   Return for intractable cough, coughing up blood, fevers > 100.4 unrelieved by medication, shortness of breath, intractable vomiting, chest pain, shortness of breath, weakness, numbness, changes in speech, facial asymmetry, abdominal pain, passing out, Inability to tolerate liquids or food, cough, altered mental status or any concerns. No signs of systemic illness or infection. The patient is nontoxic-appearing on exam and vital signs are within normal limits.  I have reviewed the triage vital signs and the nursing notes. Pertinent labs & imaging results that were available during my care of the patient were reviewed by me and  considered in my medical decision making (see chart for details). After history, exam, and medical workup I feel the patient has been appropriately medically screened and is safe for discharge home. Pertinent diagnoses were discussed with the patient. Patient was given return precautions. Rx / DC Orders ED Discharge Orders     None         Erdine Hulen, MD 03/28/22 0236

## 2022-03-29 DIAGNOSIS — R310 Gross hematuria: Secondary | ICD-10-CM | POA: Diagnosis not present

## 2022-03-29 DIAGNOSIS — D414 Neoplasm of uncertain behavior of bladder: Secondary | ICD-10-CM | POA: Diagnosis not present

## 2022-03-29 DIAGNOSIS — R338 Other retention of urine: Secondary | ICD-10-CM | POA: Diagnosis not present

## 2022-04-03 DIAGNOSIS — N401 Enlarged prostate with lower urinary tract symptoms: Secondary | ICD-10-CM | POA: Diagnosis not present

## 2022-04-03 DIAGNOSIS — R3914 Feeling of incomplete bladder emptying: Secondary | ICD-10-CM | POA: Diagnosis not present

## 2022-04-03 DIAGNOSIS — R310 Gross hematuria: Secondary | ICD-10-CM | POA: Diagnosis not present

## 2022-04-03 DIAGNOSIS — D3502 Benign neoplasm of left adrenal gland: Secondary | ICD-10-CM | POA: Diagnosis not present

## 2022-04-05 ENCOUNTER — Telehealth: Payer: Self-pay | Admitting: *Deleted

## 2022-04-05 DIAGNOSIS — M48062 Spinal stenosis, lumbar region with neurogenic claudication: Secondary | ICD-10-CM | POA: Diagnosis not present

## 2022-04-05 DIAGNOSIS — M47816 Spondylosis without myelopathy or radiculopathy, lumbar region: Secondary | ICD-10-CM | POA: Diagnosis not present

## 2022-04-05 NOTE — Telephone Encounter (Signed)
     Patient  visit on 03/28/2022  at [facility]  was for treatment   Have you been able to follow up with your primary care physician? Able to see urology dr a few days ago , and is feeling better and is able get all medications and has transportation  The patient was  able to obtain any needed medicine or equipment.  Are there diet recommendations that you are having difficulty following?  Patient expresses understanding of discharge instructions and education provided has no other needs at this time.  [place] [phone] [address] , [place] [address] Email : [email or web address]

## 2022-05-05 ENCOUNTER — Encounter (HOSPITAL_BASED_OUTPATIENT_CLINIC_OR_DEPARTMENT_OTHER): Payer: Self-pay

## 2022-05-05 ENCOUNTER — Emergency Department (HOSPITAL_BASED_OUTPATIENT_CLINIC_OR_DEPARTMENT_OTHER): Payer: PPO

## 2022-05-05 ENCOUNTER — Other Ambulatory Visit: Payer: Self-pay

## 2022-05-05 ENCOUNTER — Emergency Department (HOSPITAL_BASED_OUTPATIENT_CLINIC_OR_DEPARTMENT_OTHER)
Admission: EM | Admit: 2022-05-05 | Discharge: 2022-05-05 | Disposition: A | Discharge status: Home / Self Care | Payer: PPO | Attending: Emergency Medicine | Admitting: Emergency Medicine

## 2022-05-05 DIAGNOSIS — K5641 Fecal impaction: Secondary | ICD-10-CM | POA: Diagnosis not present

## 2022-05-05 DIAGNOSIS — N3289 Other specified disorders of bladder: Secondary | ICD-10-CM | POA: Diagnosis not present

## 2022-05-05 DIAGNOSIS — N401 Enlarged prostate with lower urinary tract symptoms: Secondary | ICD-10-CM | POA: Diagnosis not present

## 2022-05-05 DIAGNOSIS — K5900 Constipation, unspecified: Secondary | ICD-10-CM | POA: Diagnosis not present

## 2022-05-05 DIAGNOSIS — K140 Glossitis: Secondary | ICD-10-CM | POA: Diagnosis not present

## 2022-05-05 DIAGNOSIS — N261 Atrophy of kidney (terminal): Secondary | ICD-10-CM | POA: Diagnosis not present

## 2022-05-05 DIAGNOSIS — R319 Hematuria, unspecified: Secondary | ICD-10-CM | POA: Diagnosis present

## 2022-05-05 LAB — URINALYSIS, ROUTINE W REFLEX MICROSCOPIC
Bilirubin Urine: NEGATIVE
Glucose, UA: >1000 mg/dL — AB
Nitrite: NEGATIVE
Protein, ur: NEGATIVE mg/dL
Specific Gravity, Urine: 1.022 (ref 1.005–1.030)
WBC, UA: >50 WBC/hpf (ref 0–5)
pH: 6.5 (ref 5.0–8.0)

## 2022-05-05 LAB — CBC WITH DIFFERENTIAL/PLATELET
Abs Immature Granulocytes: 0.03 10*3/uL (ref 0.00–0.07)
Basophils Absolute: 0 10*3/uL (ref 0.0–0.1)
Basophils Relative: 0 %
Eosinophils Absolute: 0.1 10*3/uL (ref 0.0–0.5)
Eosinophils Relative: 1 %
HCT: 29.6 % — ABNORMAL LOW (ref 39.0–52.0)
Hemoglobin: 9.8 g/dL — ABNORMAL LOW (ref 13.0–17.0)
Immature Granulocytes: 0 %
Lymphocytes Relative: 11 %
Lymphs Abs: 0.9 10*3/uL (ref 0.7–4.0)
MCH: 29.5 pg (ref 26.0–34.0)
MCHC: 33.1 g/dL (ref 30.0–36.0)
MCV: 89.2 fL (ref 80.0–100.0)
Monocytes Absolute: 0.6 10*3/uL (ref 0.1–1.0)
Monocytes Relative: 7 %
Neutro Abs: 6.8 10*3/uL (ref 1.7–7.7)
Neutrophils Relative %: 81 %
Platelets: 414 10*3/uL — ABNORMAL HIGH (ref 150–400)
RBC: 3.32 MIL/uL — ABNORMAL LOW (ref 4.22–5.81)
RDW: 13.8 % (ref 11.5–15.5)
WBC: 8.4 10*3/uL (ref 4.0–10.5)
nRBC: 0 % (ref 0.0–0.2)

## 2022-05-05 LAB — COMPREHENSIVE METABOLIC PANEL
ALT: 19 U/L (ref 0–44)
AST: 23 U/L (ref 15–41)
Albumin: 4.4 g/dL (ref 3.5–5.0)
Alkaline Phosphatase: 69 U/L (ref 38–126)
Anion gap: 10 (ref 5–15)
BUN: 15 mg/dL (ref 8–23)
CO2: 21 mmol/L — ABNORMAL LOW (ref 22–32)
Calcium: 10.1 mg/dL (ref 8.9–10.3)
Chloride: 104 mmol/L (ref 98–111)
Creatinine, Ser: 1.03 mg/dL (ref 0.61–1.24)
GFR, Estimated: >60 mL/min (ref >60)
Glucose, Bld: 177 mg/dL — ABNORMAL HIGH (ref 70–99)
Potassium: 4.4 mmol/L (ref 3.5–5.1)
Sodium: 135 mmol/L (ref 135–145)
Total Bilirubin: 0.5 mg/dL (ref 0.3–1.2)
Total Protein: 7.7 g/dL (ref 6.5–8.1)

## 2022-05-05 MED ORDER — POLYETHYLENE GLYCOL 3350 17 G PO PACK: 17.0000 g | PACK | Freq: Every day | ORAL | 0 refills | Status: AC

## 2022-05-05 MED ORDER — MAGNESIUM CITRATE PO SOLN: 1.0000 | Freq: Once | ORAL | Status: DC

## 2022-05-05 MED ORDER — POLYETHYLENE GLYCOL 3350 17 G PO PACK
17.0000 g | PACK | Freq: Every day | ORAL | Status: DC
  Administered 2022-05-05: 17 g via ORAL
  Filled 2022-05-05: qty 1

## 2022-05-05 MED ORDER — FLEET ENEMA 7-19 GM/118ML RE ENEM: 1.0000 | ENEMA | Freq: Once | RECTAL | Status: DC

## 2022-05-05 MED ORDER — IOHEXOL 300 MG/ML  SOLN
100.0000 mL | Freq: Once | INTRAMUSCULAR | Status: AC | PRN
  Administered 2022-05-05: 100 mL via INTRAVENOUS

## 2022-05-05 MED ORDER — METAMUCIL SMOOTH TEXTURE 58.6 % PO POWD: 1.0000 | Freq: Three times a day (TID) | ORAL | 12 refills | Status: AC

## 2022-05-05 MED ORDER — SORBITOL 70 % SOLN: 960.0000 mL | TOPICAL_OIL | Freq: Once | ORAL | Status: DC

## 2022-05-05 NOTE — ED Triage Notes (Signed)
C/o constipation x 3 days. Took 3 dulcolax yesterday am and 3 yesterday pm then drank prune juice and ate some prunes w/o results, other that cramping

## 2022-05-05 NOTE — ED Notes (Signed)
Patient given discharge instructions. Questions were answered. Patient verbalized understanding of discharge instructions and care at home.  

## 2022-05-05 NOTE — ED Notes (Signed)
To CT scan

## 2022-05-05 NOTE — ED Notes (Signed)
Repeat enema completed

## 2022-05-05 NOTE — ED Provider Notes (Signed)
Patient signed out to me at 0700 by Dr. Bernette Mayers pending admission for severe constipation.   Clinical Course as of 05/05/22 1208  Sun May 05, 2022  0509 CBC with mild anemia, dropped from priors, but not in need of emergent transfusion. No active bleeding or gross hematuria now, but did have hematuria last month. [CS]  0533 CMP is normal.  [CS]  0636 I personally viewed the images from radiology studies and agree with radiologist interpretation: CT shows large fecal impaction with colon distension. Also has distended bladder, has been able to void in the ED, but post-void residual on bladder scanner is >500cc. May be a chronic issue due to his BPH. He does not have signs of bladder mass now, his wife confirms he had a cystoscopy and was told it was just blood clots and no mass. With RN chaperone, I attempted fecal disimpaction, large stool ball was broken up some but did not tolerate well. Will need admission for bowel clean out.  [CS]  [id number] I spoke with Dr. Dorna Bloom hospitalist who recommended enema and laxative and attempt for bowel movement. If unsuccessful will call back for admission. Patient is agreeable with plan. [VK]  [phone] Patient had improvement of pain and was able to pass a small amount of stool. He is request a repeat soap suds enema. [VK]  1126 Patient had another liquidly stool with improvement of pain.  He is stable for discharge home on bowel regimen with primary care follow-up and was given strict return precautions. [VK]    Clinical Course User Index [CS] Pollyann Savoy, MD [VK] Rexford Maus, DO      Rexford Maus, [place] 05/05/22 1208

## 2022-05-05 NOTE — ED Provider Notes (Signed)
[place] [facility] EMERGENCY DEPARTMENT AT [facility]  Provider Note  CSN: [id number] Arrival date & time: 05/05/22 0411  History Chief Complaint  Patient presents with   Constipation    Arrived POV with c/o constipation. Last BM 3 days ago. Took 3 dulcolax pills yesterday am and 3 pills in the pm    Ricky Shields is a 77 y.o. 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 He reports he is able to urinate well and not currently using a catheter. Today he has had some abdominal cramping and feeling constipated, particularly after he urinates he has the urge to have a bowel movement but only small amount of stool comes out. He has not had much appetite today. Denies any vomiting or fever. He has tried colace and pruines/prune juice without results.    Home Medications Prior to Admission medications   Medication Sig Start Date End Date Taking? Authorizing Provider  aspirin EC 81 MG tablet Take 81 mg by mouth daily. Swallow whole.    [provider]  atorvastatin (LIPITOR) 40 MG tablet Take 1 tablet (40 mg total) by mouth daily. 12/12/21   Arnette Felts, FNP  cholecalciferol (VITAMIN D3) 25 MCG (1000 UNIT) tablet Take 1,000 Units by mouth daily.    [provider]  docusate sodium (COLACE) 100 MG capsule Take 1 capsule (100 mg total) by mouth 2 (two) times daily. 08/17/21   Tressie Stalker, MD  esomeprazole (NEXIUM) 20 MG capsule Take 20 mg by mouth daily as needed (acid reflux).    [provider]  glucose blood (ONETOUCH ULTRA) test strip Use as instructed 01/10/21   Arnette Felts, FNP  glucose blood test strip Use as instructed to test blood sugars three times a day DX:E11.65 10/25/19   Arnette Felts, FNP  JARDIANCE 10 MG  TABS tablet TAKE 1 TABLET(10 MG) BY MOUTH DAILY BEFORE BREAKFAST 11/12/21   Arnette Felts, FNP  Lidocaine 4 % GEL Apply 1 Application topically daily as needed (pain).    [provider]  metFORMIN (GLUCOPHAGE) 1000 MG tablet TAKE 1 TABLET(1000 MG) BY MOUTH TWICE DAILY WITH A MEAL 12/17/21   Arnette Felts, FNP  Multiple Vitamin (MULTIVITAMIN WITH MINERALS) TABS tablet Take 1 tablet by mouth daily. One a Day Men's    [provider]  Multiple Vitamins-Minerals (PRESERVISION AREDS 2) CAPS Take 1 capsule by mouth 2 (two) times daily.    [provider]  naloxone [facility]) nasal spray 4 mg/0.1 mL Place 1 spray into the nose as needed (opioid overdose).    [provider]  Olmesartan-amLODIPine-HCTZ 40-10-12.5 MG TABS TAKE 1 TABLET BY MOUTH DAILY 11/30/21   Arnette Felts, FNP  Omega-3 Fatty Acids (FISH OIL) 1200 MG CAPS Take 1,200 mg by mouth daily.    [provider]  spironolactone (ALDACTONE) 25 MG tablet Take 0.5 tablets (12.5 mg total) by mouth daily. 11/19/21 05/18/22  Arnette Felts, FNP  tamsulosin (FLOMAX) 0.4 MG CAPS capsule Take 0.4 mg by mouth daily. 11/20/15   [provider]  tamsulosin (FLOMAX) 0.4 MG CAPS capsule Take 1 capsule (0.4 mg total) by mouth daily. 03/28/22   Palumbo, April, MD  valACYclovir (VALTREX) 500 MG tablet Take 500 mg  by mouth daily. 03/11/22   [provider]     Allergies    Penicillins   Review of Systems   Review of Systems Please see HPI for pertinent positives and negatives  Physical Exam BP (!) 161/89 (BP Location: Right Arm)   Pulse (!) 115   Temp 98 F (36.7 C) (Oral)   Resp (!) 98   SpO2 98%   Physical Exam Vitals and nursing note reviewed.  Constitutional:      Appearance: Normal appearance.  HENT:     Head: Normocephalic and atraumatic.     Nose: Nose normal.     Mouth/Throat:     Mouth: Mucous membranes are moist.  Eyes:     Extraocular Movements: Extraocular movements intact.      Conjunctiva/sclera: Conjunctivae normal.  Cardiovascular:     Rate and Rhythm: Normal rate.  Pulmonary:     Effort: Pulmonary effort is normal.     Breath sounds: Normal breath sounds.  Abdominal:     General: Abdomen is flat.     Palpations: Abdomen is soft. There is no mass.     Tenderness: There is no abdominal tenderness. There is no guarding.  Musculoskeletal:        General: No swelling. Normal range of motion.     Cervical back: Neck supple.  Skin:    General: Skin is warm and dry.  Neurological:     General: No focal deficit present.     Mental Status: He is alert.  Psychiatric:        Mood and Affect: Mood normal.     ED Results / Procedures / Treatments   EKG None  Procedures Procedures  Medications Ordered in the ED Medications  iohexol (OMNIPAQUE) 300 MG/ML solution 100 mL (100 mLs Intravenous Contrast Given 05/05/22 0555)    Initial Impression and Plan  Patient here with constipation, abdomen is benign. Recent CT showed bladder mass. Will check labs and CT to eval progression and for signs of obstruction.   ED Course   Clinical Course as of 05/05/22 1610  Wynelle Link May 05, 2022  0509 CBC with mild anemia, dropped from priors, but not in need of emergent transfusion. No active bleeding or gross hematuria now, but did have hematuria last month. [CS]  0533 CMP is normal.  [CS]  0636 I personally viewed the images from radiology studies and agree with radiologist interpretation: CT shows large fecal impaction with colon distension. Also has distended bladder, has been able to void in the ED, but post-void residual on bladder scanner is >500cc. May be a chronic issue due to his BPH. He does not have signs of bladder mass now, his wife confirms he had a cystoscopy and was told it was just blood clots and no mass. With RN chaperone, I attempted fecal disimpaction, large stool ball was broken up some but did not tolerate well. Will need admission for bowel clean out.  [CS]     Clinical Course User Index [CS] Pollyann Savoy, MD     MDM Rules/Calculators/A&P Medical Decision Making Problems Addressed: Benign prostatic hyperplasia with incomplete bladder emptying: chronic illness or injury Fecal impaction: acute illness or injury  Amount and/or Complexity of Data Reviewed Labs: ordered. Decision-making details documented in ED Course. Radiology: ordered and independent interpretation performed. Decision-making details documented in ED Course.  Risk Prescription drug management. Decision regarding hospitalization.     Final Clinical Impression(s) / ED Diagnoses Final diagnoses:  Fecal impaction  Benign prostatic  hyperplasia with incomplete bladder emptying    Rx / DC Orders ED Discharge Orders     None        Pollyann Savoy, MD 05/05/22 [phone]

## 2022-05-05 NOTE — ED Notes (Signed)
Soap suds enema completed.

## 2022-05-05 NOTE — Discharge Instructions (Addendum)
You were seen in the emergency department for your constipation.  You had a large amount of stool and a fecal impaction on your CT imaging.  We gave you an enema as well as MiraLAX and you were able to start having a bowel movement.  You should continue to take MiraLAX daily until you are having regular soft bowel movements every day then you can cut the dose in half until you are again having regular soft bowel movements every day and then you can return to as needed.  You should continue to take Colace your stool softener as well as Metamucil which is a fiber supplement.  If you do not have a bowel movement for several days you can buy an over-the-counter enema to give yourself at home.  You should follow-up with your primary doctor in the next few days to have your symptoms rechecked.  You should return to the emergency department if you have significantly worsening pain, you are not having any bowel movements and not passing gas, you have repetitive vomiting or if you have any other new or concerning symptoms.

## 2022-05-10 ENCOUNTER — Telehealth: Payer: Self-pay

## 2022-05-10 NOTE — Telephone Encounter (Signed)
     Patient  visit on 4/21  at    Have you been able to follow up with your primary care physician? Yes   The patient was or was not able to obtain any needed medicine or equipment. Yes   Are there diet recommendations that you are having difficulty following? Na   Patient expresses understanding of discharge instructions and education provided has no other needs at this time.  Yes      Jamas Jaquay Pop Health Care Guide, [place] [phone] [address], [place], [place] [address] Phone: [phone] Email: [email or web address]    

## 2022-05-10 NOTE — Telephone Encounter (Signed)
        Patient  visited [facility] on 4/21     Telephone encounter attempt :  1st  A HIPAA compliant voice message was left requesting a return call.  Instructed patient to call back.    Haddon Fyfe Pop Health Care Guide, [place] [phone] [address], [place], [place] [address] Phone: [phone] Email: [email or web address]       

## 2022-05-11 ENCOUNTER — Other Ambulatory Visit: Payer: Self-pay | Admitting: Nurse Practitioner

## 2022-05-11 DIAGNOSIS — I10 Essential (primary) hypertension: Secondary | ICD-10-CM

## 2022-05-13 ENCOUNTER — Other Ambulatory Visit: Payer: Self-pay

## 2022-05-13 DIAGNOSIS — I10 Essential (primary) hypertension: Secondary | ICD-10-CM

## 2022-05-13 MED ORDER — SPIRONOLACTONE 25 MG PO TABS
12.5000 mg | ORAL_TABLET | Freq: Once | ORAL | 1 refills | Status: DC
Start: 2022-05-13 — End: 2022-11-04

## 2022-05-15 ENCOUNTER — Other Ambulatory Visit: Payer: Self-pay | Admitting: Nurse Practitioner

## 2022-05-19 ENCOUNTER — Other Ambulatory Visit: Payer: Self-pay | Admitting: Nurse Practitioner

## 2022-05-19 DIAGNOSIS — E119 Type 2 diabetes mellitus without complications: Secondary | ICD-10-CM

## 2022-05-21 ENCOUNTER — Ambulatory Visit: Payer: PPO | Admitting: Nurse Practitioner

## 2022-05-23 DIAGNOSIS — R3914 Feeling of incomplete bladder emptying: Secondary | ICD-10-CM | POA: Diagnosis not present

## 2022-05-23 DIAGNOSIS — N401 Enlarged prostate with lower urinary tract symptoms: Secondary | ICD-10-CM | POA: Diagnosis not present

## 2022-05-27 ENCOUNTER — Telehealth: Payer: Self-pay

## 2022-05-27 NOTE — Progress Notes (Signed)
Care Management & Coordination Services Pharmacy Team  Reason for Encounter: Appointment Reminder  Contacted patient to confirm telephone appointment with Cherylin Mylar, PharmD on 05-29-2022 at 2:00. Spoke with patient on 05/27/2022   Do you have any problems getting your medications? No  What is your top health concern you would like to discuss at your upcoming visit? Patient stated no concerns at the moment  Have you seen any other providers since your last visit with PCP? Yes   Chart review: Recent office visits:  03-20-2022 Arnette Felts, FNP. Follow up visit for statin. No changes  Recent consult visits:  04-05-2022 [facility] NP Meghan (Neurosurgery). Follow up visit for back pain  Hospital visits:  Medication Reconciliation was completed by comparing discharge summary, patient's EMR and Pharmacy list, and upon discussion with patient.  Admitted to the hospital on 05-05-2022 due to Fecal impaction. Discharge date was 05-05-2022. Discharged from [facility] ED.  New?Medications Started at [facility] Discharge:?? Miralax 17 grams daily Metamucil 1 pack 3 times daily  Medication Changes at Hospital Discharge: None  Medications Discontinued at Hospital Discharge: None  Medications that remain the same after Hospital Discharge:??  -All other medications will remain the same.    Hospital visits:  Medication Reconciliation was completed by comparing discharge summary, patient's EMR and Pharmacy list, and upon discussion with patient.  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 Discharge date was 03-27-2022. Discharged from [facility] ED.  New?Medications Started at [facility] Discharge:?? Bactrim 800-160 mg twice daily Flomax 0.4 mg daily  Medication Changes at Hospital Discharge: None  Medications Discontinued at Hospital Discharge: None  Medications that remain the same after Hospital Discharge:??  -All other medications will remain the same.      Star Rating Drugs:  Atorvastatin 40 mg- Last filled 03-10-2022 90 DS [name]. Previous 12-12-2021 90 DS. Jardiance 10 mg- Lat filled 05-20-2022 30 DS [name]. Previous 04-13-2022 30 DS. Metformin 1000 mg- Last filled 03-31-2022 90 DS [name]. Previous 12-17-2021 90 DS Olm/Amlo/HCTZ 40-10-12.5 mg- Last filled 03-07-2022 90 DS [name]. Previous 11-30-2021 90 DS.   Care Gaps: Annual wellness visit in last year? Yes Covid booster overdue  If Diabetic: Last eye exam / retinopathy screening: 01-03-2022 Last diabetic foot exam: None   Huey Romans [facility] Clinical Pharmacist Assistant [phone]

## 2022-05-29 ENCOUNTER — Ambulatory Visit: Payer: PPO

## 2022-05-29 NOTE — Progress Notes (Signed)
Care Management & Coordination Services Pharmacy Note  05/29/2022 Name:  [name] MRN:  [id number] DOB:  [birth year]  Summary: Patient reports that he is doing well he is going to continue to improve   Recommendations/Changes made from today's visit: Recommend patient to start doing the plate  for dinner for the next 8 weeks.   Follow up plan: He is exercising at least 2-3 times per week for at least 25-30 minutes   Subjective: Ricky Shields is an 77 y.o. year old male who is a primary patient of Arnette Felts, FNP.  The care coordination team was consulted for assistance with disease management and care coordination needs.    Engaged with patient by telephone for follow up visit.  Recent office visits:  03-20-2022 Arnette Felts, FNP. Follow up visit for statin. No changes   Recent consult visits:  04-05-2022 [facility] NP Meghan (Neurosurgery). Follow up visit for back pain   Hospital visits:  Medication Reconciliation was completed by comparing discharge summary, patient's EMR and Pharmacy list, and upon discussion with patient.   Admitted to the hospital on 05-05-2022 due to Fecal impaction. Discharge date was 05-05-2022. Discharged from [facility] ED.   New?Medications Started at [facility] Discharge:?? Miralax 17 grams daily Metamucil 1 pack 3 times daily   Medication Changes at Hospital Discharge: None   Medications Discontinued at Hospital Discharge: None   Medications that remain the same after Hospital Discharge:??  -All other medications will remain the same.     Hospital visits:  Medication Reconciliation was completed by comparing discharge summary, patient's EMR and Pharmacy list, and upon discussion with patient.   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 Discharge date was 03-27-2022. Discharged from [facility] ED.   New?Medications Started at [facility] Discharge:?? Bactrim 800-160 mg twice daily Flomax 0.4 mg daily   Medication  Changes at Hospital Discharge: None   Medications Discontinued at Hospital Discharge: None   Medications that remain the same after Hospital Discharge:??  -All other medications will remain the same.    Objective:  Lab Results  Component Value Date   CREATININE 1.03 05/05/2022   BUN 15 05/05/2022   EGFR 69 01/24/2022   GFRNONAA >60 05/05/2022   GFRAA 70 01/10/2020   NA 135 05/05/2022   K 4.4 05/05/2022   CALCIUM 10.1 05/05/2022   CO2 21 (L) 05/05/2022   GLUCOSE 177 (H) 05/05/2022    Lab Results  Component Value Date/Time   HGBA1C 6.6 (H) 03/20/2022 11:56 AM   HGBA1C 6.4 (H) 11/19/2021 11:40 AM   MICROALBUR 30 07/08/2019 12:51 PM   MICROALBUR 80 06/25/2018 12:39 PM    Last diabetic Eye exam:  Lab Results  Component Value Date/Time   HMDIABEYEEXA No Retinopathy 01/02/2021 12:00 AM    Last diabetic Foot exam: No results found for: "HMDIABFOOTEX"   Lab Results  Component Value Date   CHOL 140 01/24/2022   HDL 57 01/24/2022   LDLCALC 65 01/24/2022   TRIG 94 01/24/2022   CHOLHDL 2.5 01/24/2022       Latest Ref Rng & Units 05/05/2022    4:59 AM 01/24/2022    4:19 PM 07/18/2021   11:44 AM  Hepatic Function  Total Protein 6.5 - 8.1 g/dL 7.7  7.1  7.2   Albumin 3.5 - 5.0 g/dL 4.4  4.5  4.6   AST 15 - 41 U/L 23  19  19    ALT 0 - 44 U/L 19  21  18  Alk Phosphatase 38 - 126 U/L 69  75  83   Total Bilirubin 0.3 - 1.2 mg/dL 0.5  0.6  0.5     Lab Results  Component Value Date/Time   TSH 1.510 07/18/2021 11:44 AM       Latest Ref Rng & Units 05/05/2022    4:59 AM 03/28/2022   12:15 AM 07/18/2021   11:44 AM  CBC  WBC 4.0 - 10.5 K/uL 8.4  7.2  6.1   Hemoglobin 13.0 - 17.0 g/dL 9.8  40.9  81.1   Hematocrit 39.0 - 52.0 % 29.6  38.2  44.1   Platelets 150 - 400 K/uL 414  230  279     No results found for: "VD25OH", "VITAMINB12"  Clinical ASCVD: No  The 10-year ASCVD risk score (Arnett DK, et al., 2019) is: 37.9%   Values used to calculate the score:     Age: 75  years     Sex: Male     Is Non-Hispanic African American: Yes     Diabetic: Yes     Tobacco smoker: No     Systolic Blood Pressure: 136 mmHg     Is BP treated: Yes     HDL Cholesterol: 57 mg/dL     Total Cholesterol: 140 mg/dL        09/14/4780   95:62 AM 11/19/2021   10:51 AM 08/09/2021    2:34 PM  Depression screen PHQ 2/9  Decreased Interest 0 0 0  Down, Depressed, Hopeless 0 0 0  PHQ - 2 Score 0 0 0     Social History   Tobacco Use  Smoking Status Never  Smokeless Tobacco Never   BP Readings from Last 3 Encounters:  05/05/22 136/84  03/28/22 (!) 145/84  03/20/22 (!) 122/58   Pulse Readings from Last 3 Encounters:  05/05/22 90  03/28/22 87  03/20/22 90   Wt Readings from Last 3 Encounters:  05/05/22 160 lb 0.9 oz (72.6 kg)  03/27/22 160 lb (72.6 kg)  03/20/22 153 lb (69.4 kg)   BMI Readings from Last 3 Encounters:  05/05/22 23.64 kg/m  03/27/22 23.63 kg/m  03/20/22 22.59 kg/m    Allergies  Allergen Reactions   Penicillins Rash    Has patient had a PCN reaction causing immediate rash, facial/tongue/throat swelling, SOB or lightheadedness with hypotension: No Has patient had a PCN reaction causing severe rash involving mucus membranes or skin necrosis: No Has patient had a PCN reaction that required hospitalization No Has patient had a PCN reaction occurring within the last 10 years: No If all of the above answers are "NO", then may proceed with Cephalosporin use.     Medications Reviewed Today     Reviewed by Harlan Stains, RPH (Pharmacist) on 05/29/22 at 1435  Med List Status: <None>   Medication Order Taking? Sig Documenting Provider Last Dose Status Informant  aspirin EC 81 MG tablet [id number] No Take 81 mg by mouth daily. Swallow whole. [provider] Taking Active Self  atorvastatin (LIPITOR) 40 MG tablet [id number] No Take 1 tablet (40 mg total) by mouth daily. Arnette Felts, FNP Taking Active   cholecalciferol (VITAMIN D3) 25 MCG  (1000 UNIT) tablet [id number] No Take 1,000 Units by mouth daily. [provider] Taking Active Self  docusate sodium (COLACE) 100 MG capsule [id number] No Take 1 capsule (100 mg total) by mouth 2 (two) times daily. Tressie Stalker, MD Taking Active   esomeprazole (NEXIUM) 20 MG capsule [id number] No Take  20 mg by mouth daily as needed (acid reflux). [provider] Taking Active Self  glucose blood (ONETOUCH ULTRA) test strip [id number] No Use as instructed Arnette Felts, FNP Taking Active Self  glucose blood test strip [id number] No Use as instructed to test blood sugars three times a day DX:E11.65 Arnette Felts, FNP Taking Active Self  JARDIANCE 10 MG TABS tablet [id number]  TAKE 1 TABLET(10 MG) BY MOUTH DAILY BEFORE Golden Hurter, FNP  Active   Lidocaine 4 % GEL [id number] No Apply 1 Application topically daily as needed (pain). [provider] Taking Active Self  metFORMIN (GLUCOPHAGE) 1000 MG tablet [id number] No TAKE 1 TABLET(1000 MG) BY MOUTH TWICE DAILY WITH A MEAL Arnette Felts, FNP Taking Active   Multiple Vitamin (MULTIVITAMIN WITH MINERALS) TABS tablet [id number] No Take 1 tablet by mouth daily. One a Day Men's [provider] Taking Active Self  Multiple Vitamins-Minerals (PRESERVISION AREDS 2) CAPS [id number] No Take 1 capsule by mouth 2 (two) times daily. [provider] Taking Active Self  naloxone [facility]) nasal spray 4 mg/0.1 mL [id number] No Place 1 spray into the nose as needed (opioid overdose). [provider] Taking Active Self  Olmesartan-amLODIPine-HCTZ 40-10-12.5 MG TABS [id number] No TAKE 1 TABLET BY MOUTH DAILY Arnette Felts, FNP Taking Active   Omega-3 Fatty Acids (FISH OIL) 1200 MG CAPS [id number] No Take 1,200 mg by mouth daily. [provider] Taking Active Self  polyethylene glycol (MIRALAX) 17 g packet [id number]  Take 17 g by mouth daily. Elayne Snare K, DO  Active   psyllium (METAMUCIL SMOOTH TEXTURE)  58.6 % powder [id number]  Take 1 packet by mouth 3 (three) times daily. Elayne Snare K, DO  Active   spironolactone (ALDACTONE) 25 MG tablet [id number]  Take 0.5 tablets (12.5 mg total) by mouth once for 1 dose. Take one tablet by mouth daily Arnette Felts, FNP  Expired 05/13/22 2359   tamsulosin (FLOMAX) 0.4 MG CAPS capsule [id number]  Take 1 capsule (0.4 mg total) by mouth daily. Palumbo, April, MD  Active   valACYclovir (VALTREX) 500 MG tablet [id number] No Take 500 mg by mouth daily. [provider] Taking Active             SDOH:  (Social Determinants of Health) assessments and interventions performed: No SDOH Interventions    Flowsheet Row Chronic Care Management from 10/07/2019 in [facility] Clinical Support from 07/08/2019 in [facility] Clinical Support from 06/25/2018 in [facility]  SDOH Interventions     Depression Interventions/Treatment  -- GGY6-9 Score <4 Follow-up Not Indicated PHQ2-9 Score <4 Follow-up Not Indicated  Financial Strain Interventions Intervention Not Indicated -- --       Medication Assistance: None required.  Patient affirms current coverage meets needs.  Medication Access: Within the past 30 days, how often has patient missed a dose of medication? 1-2 Is a pillbox or other method used to improve adherence? Yes  Factors that may affect medication adherence? nonadherence to medications and lack of understanding of disease management Are meds synced by current pharmacy? No  Are meds delivered by current pharmacy? No  Does patient experience delays in picking up medications due to transportation concerns? No   Name and location of Current pharmacy:  [id number] [place], [place] - [address] AT [facility] OF [facility] [address] [place] [address] Phone: [phone] Fax:  [phone]  Compliance/Adherence/Medication fill history: Care Gaps: Annual wellness visit in last year? Yes Covid booster overdue   If Diabetic: Last eye exam / retinopathy screening: 01-03-2022 Last diabetic foot exam: None  Star-Rating Drugs: Atorvastatin 40 mg- Last filled 03-10-2022 90 DS [name]. Previous 12-12-2021 90 DS. Jardiance 10 mg- Lat filled 05-20-2022 30 DS [name]. Previous 04-13-2022 30 DS. Metformin 1000 mg- Last filled 03-31-2022 90 DS [name]. Previous 12-17-2021 90 DS Olm/Amlo/HCTZ 40-10-12.5 mg- Last filled 03-07-2022 90 DS [name]. Previous 11-30-2021 90 DS.   Assessment/Plan   Diabetes (A1c goal <7%) -Controlled -Current medications: Jardiance 10 mg tablet once per day Appropriate, Effective, Safe, Accessible  Metformin 1000 mg tablet twice per day Appropriate, Effective, Safe, Accessible -Current home glucose readings fasting glucose: 127.121,125,113,109 -Denies hypoglycemic/hyperglycemic symptoms -Current meal patterns:  breakfast: cereal with blueberries, banana mixed together  dinner: lima beans, corn - chicken tenders  Snacks: peanut butters crackers, LIL DEBBIE HONEY BUNS drinks: 96 ounces of water per day  -Current exercise: 60 minutes at least 2-3 times per week -Educated on Complications of diabetes including kidney damage, retinal damage, and cardiovascular disease; Prevention and management of hypoglycemic episodes; -Counseled to check feet daily and get yearly eye exams Mr. Kinloch reports that he is going to cut back on the amount of honey buns that he is eating per month. He is going to increase the amount fresh fruit and vegetables that he eats per day in particular with dinner.  -Recommended to continue current medication  Cherylin Mylar, CPP, PharmD Clinical Pharmacist Practitioner [facility] [phone]

## 2022-05-31 ENCOUNTER — Encounter: Payer: Self-pay | Admitting: Nurse Practitioner

## 2022-05-31 ENCOUNTER — Ambulatory Visit (INDEPENDENT_AMBULATORY_CARE_PROVIDER_SITE_OTHER): Payer: PPO | Admitting: Nurse Practitioner

## 2022-05-31 VITALS — BP 120/60 | HR 98 | Temp 98.4°F | Ht 69.0 in | Wt 150.2 lb

## 2022-05-31 DIAGNOSIS — M48061 Spinal stenosis, lumbar region without neurogenic claudication: Secondary | ICD-10-CM | POA: Diagnosis not present

## 2022-05-31 DIAGNOSIS — K5641 Fecal impaction: Secondary | ICD-10-CM | POA: Diagnosis not present

## 2022-05-31 DIAGNOSIS — K5900 Constipation, unspecified: Secondary | ICD-10-CM

## 2022-05-31 MED ORDER — LINACLOTIDE 72 MCG PO CAPS
72.0000 ug | ORAL_CAPSULE | Freq: Every day | ORAL | 1 refills | Status: DC
Start: 2022-05-31 — End: 2022-05-31

## 2022-05-31 MED ORDER — LINACLOTIDE 72 MCG PO CAPS: 72.0000 ug | ORAL_CAPSULE | Freq: Every day | ORAL | 1 refills | Status: DC

## 2022-05-31 NOTE — Progress Notes (Addendum)
Hershal Coria Martin,acting as a Neurosurgeon for Arnette Felts, FNP.,have documented all relevant documentation on the behalf of Arnette Felts, FNP,as directed by  Arnette Felts, FNP while in the presence of Arnette Felts, FNP.    Subjective:     Patient ID: Ricky Shields , male    DOB: May 17, 1945 , 77 y.o.   MRN: [id number]   Chief Complaint  Patient presents with   Hypertension   Diabetes    HPI  Patient presents today for a hospital follow up, patient went to ER on 05/05/2022 for Fecal impaction. He was given an enema and stool softner. Patient reports he is feeling better. He is taking a metamucil, colace and miralax.  He was not eating much and he is eating more and having good bowel movements. He was drinking less than 64 oz water a day prior to going to ER.  He is not having any runny/loose bowel movement. Denies stool is firm and he is not straining other than first starting in am. Metamucil 3 times a day taking 2-3 times. Colace - 2 times a day, miralax once a day.   BP Readings from Last 3 Encounters: 05/31/22 : 120/60 05/05/22 : 136/84 03/28/22 : (!) 145/84    Constipation This is a new problem. The patient is not on a high fiber diet. He has tried enemas, fiber and laxatives for the symptoms. There is no history of abdominal surgery.     Past Medical History:  Diagnosis Date   Back pain    intermittent-prior military injury   BPH (benign prostatic hypertrophy) with urinary retention    Diabetes mellitus without complication (HCC)    GERD (gastroesophageal reflux disease)    High cholesterol    Hypertension      Family History  Problem Relation Age of Onset   Stroke Mother    Stroke Father      Current Outpatient Medications:    aspirin EC 81 MG tablet, Take 81 mg by mouth daily. Swallow whole., Disp: , Rfl:    cholecalciferol (VITAMIN D3) 25 MCG (1000 UNIT) tablet, Take 1,000 Units by mouth daily., Disp: , Rfl:    docusate sodium (COLACE) 100 MG capsule, Take 1  capsule (100 mg total) by mouth 2 (two) times daily., Disp: 30 capsule, Rfl: 0   esomeprazole (NEXIUM) 20 MG capsule, Take 20 mg by mouth daily as needed (acid reflux)., Disp: , Rfl:    glucose blood (ONETOUCH ULTRA) test strip, Use as instructed, Disp: 100 each, Rfl: 2   glucose blood test strip, Use as instructed to test blood sugars three times a day DX:E11.65, Disp: 100 each, Rfl: 12   JARDIANCE 10 MG TABS tablet, TAKE 1 TABLET(10 MG) BY MOUTH DAILY BEFORE BREAKFAST, Disp: 30 tablet, Rfl: 5   Lidocaine 4 % GEL, Apply 1 Application topically daily as needed (pain)., Disp: , Rfl:    metFORMIN (GLUCOPHAGE) 1000 MG tablet, TAKE 1 TABLET(1000 MG) BY MOUTH TWICE DAILY WITH A MEAL, Disp: 180 tablet, Rfl: 1   Multiple Vitamin (MULTIVITAMIN WITH MINERALS) TABS tablet, Take 1 tablet by mouth daily. One a Day Men's, Disp: , Rfl:    Multiple Vitamins-Minerals (PRESERVISION AREDS 2) CAPS, Take 1 capsule by mouth 2 (two) times daily., Disp: , Rfl:    naloxone (NARCAN) nasal spray 4 mg/0.1 mL, Place 1 spray into the nose as needed (opioid overdose)., Disp: , Rfl:    Omega-3 Fatty Acids (FISH OIL) 1200 MG CAPS, Take 1,200 mg by mouth daily.,  Disp: , Rfl:    polyethylene glycol (MIRALAX) 17 g packet, Take 17 g by mouth daily., Disp: 14 each, Rfl: 0   psyllium (METAMUCIL SMOOTH TEXTURE) 58.6 % powder, Take 1 packet by mouth 3 (three) times daily., Disp: 283 g, Rfl: 12   spironolactone (ALDACTONE) 25 MG tablet, Take 0.5 tablets (12.5 mg total) by mouth once for 1 dose. Take one tablet by mouth daily, Disp: 90 tablet, Rfl: 1   tamsulosin (FLOMAX) 0.4 MG CAPS capsule, Take 1 capsule (0.4 mg total) by mouth daily., Disp: 30 capsule, Rfl: 0   valACYclovir (VALTREX) 500 MG tablet, Take 500 mg by mouth daily., Disp: , Rfl:    atorvastatin (LIPITOR) 40 MG tablet, TAKE 1 TABLET(40 MG) BY MOUTH DAILY, Disp: 90 tablet, Rfl: 1   linaclotide (LINZESS) 72 MCG capsule, Take 1 capsule (72 mcg total) by mouth daily before  breakfast., Disp: 90 capsule, Rfl: 1   Olmesartan-amLODIPine-HCTZ 40-10-12.5 MG TABS, TAKE 1 TABLET BY MOUTH DAILY, Disp: 90 tablet, Rfl: 1   Allergies  Allergen Reactions   Penicillins Rash    Has patient had a PCN reaction causing immediate rash, facial/tongue/throat swelling, SOB or lightheadedness with hypotension: No Has patient had a PCN reaction causing severe rash involving mucus membranes or skin necrosis: No Has patient had a PCN reaction that required hospitalization No Has patient had a PCN reaction occurring within the last 10 years: No If all of the above answers are "NO", then may proceed with Cephalosporin use.      Review of Systems  Constitutional: Negative.   HENT: Negative.    Eyes: Negative.   Respiratory: Negative.    Cardiovascular: Negative.   Gastrointestinal:  Positive for constipation.     Today's Vitals   05/31/22 0955  BP: 120/60  Pulse: 98  Temp: 98.4 F (36.9 C)  TempSrc: Oral  Weight: 150 lb 3.2 oz (68.1 kg)  Height: 5\' 9"  (1.753 m)  PainSc: 0-No pain   Body mass index is 22.18 kg/m.  Wt Readings from Last 3 Encounters:  05/31/22 150 lb 3.2 oz (68.1 kg)  05/05/22 160 lb 0.9 oz (72.6 kg)  03/27/22 160 lb (72.6 kg)    The 10-year ASCVD risk score (Arnett DK, et al., 2019) is: 31.3%   Values used to calculate the score:     Age: 89 years     Sex: Male     Is Non-Hispanic African American: Yes     Diabetic: Yes     Tobacco smoker: No     Systolic Blood Pressure: 120 mmHg     Is BP treated: Yes     HDL Cholesterol: 57 mg/dL     Total Cholesterol: 140 mg/dL ++ Objective:  Physical Exam      Assessment And Plan:     1. Fecal impaction (HCC) Comments: He is doing much better however he is taking MiraLAX, Metamucil and stool softenerWill try to get him approved for Linzess. No samples available at this time. Advised to continue to stay well-hydrated with water.  - linaclotide (LINZESS) 72 MCG capsule; Take 1 capsule (72 mcg total) by  mouth daily before breakfast.  Dispense: 90 capsule; Refill: 1  2. Spinal stenosis of lumbar region without neurogenic claudication Comments: Provided patient with Handicapped placard for 5 years, encouraged to continue walking as much as possible to avoid deconditioning to bones.    Return if symptoms worsen or fail to improve.  Patient was given opportunity to ask questions. Patient verbalized  understanding of the plan and was able to repeat key elements of the plan. All questions were answered to their satisfaction.  Arnette Felts, FNP   I, Arnette Felts, FNP, have reviewed all documentation for this visit. The documentation on 06/14/22 for the exam, diagnosis, procedures, and orders are all accurate and complete.   IF YOU HAVE BEEN REFERRED TO A SPECIALIST, IT MAY TAKE 1-2 WEEKS TO SCHEDULE/PROCESS THE REFERRAL. IF YOU HAVE NOT HEARD FROM US/SPECIALIST IN TWO WEEKS, PLEASE GIVE [place] A CALL AT [phone].   THE PATIENT IS ENCOURAGED TO PRACTICE SOCIAL DISTANCING DUE TO THE COVID-19 PANDEMIC.

## 2022-05-31 NOTE — Patient Instructions (Addendum)
Fecal Impaction  A fecal impaction is a large, firm amount of stool (feces) that will not pass out of the body. A fecal impaction usually happens in the rectum, which is in the end of the large intestine. A fecal impaction can block the large intestine and cause significant problems. What are the causes? This condition may be caused by anything that slows down bowel movements, including: The long-term use of laxatives, which are medicines that help you have a bowel movement. Constipation. Pain in the rectum. Fecal impaction can occur if you avoid having bowel movements due to the pain. Pain in the rectum can result from a medical condition, such as hemorrhoids or anal fissures. Narcotic pain-relieving medicines, such as methadone, morphine, or codeine. Not drinking enough fluids. Being inactive for a long period of time. Diseases of the brain or nervous system that damage nerves that control the muscles of the intestines. This condition may also be caused by dementia. What are the signs or symptoms? Common symptoms of this condition include: A sense of fullness in the rectum but being unable to pass stool. Changes in bowel patterns. This may include going to the bathroom less often or not at all. Not having a normal number of bowel movements. Thin, watery discharge from the rectum. Pain or cramps in the abdominal area. These often happen after meals. Other symptoms include: Urinating often. Nausea, vomiting, and dehydration. Dizziness and confusion. Rapid heartbeat. Fever and sweating. Changes in blood pressure. Breathing problems. How is this diagnosed? This condition may be diagnosed based on your symptoms and an exam of your rectum. Sometimes, X-rays or lab tests are done to confirm the diagnosis and to check for other problems. How is this treated? This condition may be treated by: Having your health care provider remove the stool using a gloved finger. Taking medicine. Using a  suppository or enema in the rectum to soften the stool, which can stimulate a bowel movement. Follow these instructions at home: Eating and drinking  Drink enough fluid to keep your urine pale yellow. Eat foods that are high in fiber, such as beans, whole grains, and fresh fruits and vegetables. If you begin to get constipated, increase the amount of fiber in your diet. General instructions Develop bowel habits. An example of a bowel habit is having a bowel movement right after breakfast every day. Be sure to give yourself enough time on the toilet. This may require using enemas, bowel softeners, or suppositories at home, as directed by your health care provider. It may also include using mineral oil or olive oil. Do exercises as told by your health care provider. Take over-the-counter and prescription medicines only as told by your health care provider. Keep all follow-up visits as told by your health care provider. This is important. Contact a health care provider if you: Have ongoing pain in your rectum. Need to use an enema or a suppository more than 2 times a week. Have rectal bleeding. Continue to have problems. The problems may include not being able to go to the bathroom and having long-term constipation. Have pain in your abdomen. Have thin, pencil-like stools. Get help right away if: You have black or tarry stools. Summary A fecal impaction is a large, firm amount of stool (feces) that will not pass out of the body. A fecal impaction can block the large intestine and cause significant problems. This condition may be caused by anything that slows down bowel movements. This condition may be treated by having your  health care provider remove the stool using a gloved finger, taking medicine, or using a suppository or enema in the rectum to soften the stool. Develop bowel habits and eat foods that are high in fiber, such as beans, whole grains, and fresh fruits and vegetables. Contact  a health care provider if you continue to have problems. The problems may include not being able to go to the bathroom and having long-term constipation. This information is not intended to replace advice given to you by your health care provider. Make sure you discuss any questions you have with your health care provider. Document Revised: 06/30/2018 Document Reviewed: 06/30/2018 Elsevier Patient Education  2023 [employer].

## 2022-06-05 ENCOUNTER — Other Ambulatory Visit: Payer: Self-pay | Admitting: Nurse Practitioner

## 2022-06-11 ENCOUNTER — Other Ambulatory Visit: Payer: Self-pay | Admitting: Nurse Practitioner

## 2022-06-18 ENCOUNTER — Telehealth: Payer: Self-pay

## 2022-06-18 NOTE — Progress Notes (Signed)
Care Management & Coordination Services Pharmacy Team  Reason for Encounter: Diabetes  Contacted patient to discuss diabetes disease state. {US HC Outreach:28874}  Current antihyperglycemic regimen:     Patient verbally confirms he is taking the above medications as directed. {yes/no:20286}  What diet changes have been made to improve diabetes control?  What recent interventions/DTPs have been made to improve glycemic control:  ***  Have there been any recent hospitalizations or ED visits since last visit with PharmD? {yes/no:20286}  Patient {reports/denies:24182} hypoglycemic symptoms, including {Hypoglycemic Symptoms:3049003}  Patient {reports/denies:24182} hyperglycemic symptoms, including {symptoms; hyperglycemia:17903}  How often are you checking your blood sugar? {BG Testing frequency:23922}  What are your blood sugars ranging?  Fasting:  Before meals: *** After meals: *** Bedtime: ***  During the week, how often does your blood glucose drop below 70? {LowBGfrequency:24142}  Are you checking your feet daily/regularly? {yes/no:20286}  Adherence Review: Is the patient currently on a STATIN medication? Yes Is the patient currently on ACE/ARB medication? Yes Does the patient have >5 day gap between last estimated fill dates? No   Chart Updates: Recent office visits:  05-31-2022 Arnette Felts, FNP. Hospital follow up for fecal impaction. Start linzess 72 mcg daily.  Recent consult visits:  None  Hospital visits:  Medication Reconciliation was completed by comparing discharge summary, patient's EMR and Pharmacy list, and upon discussion with patient.   Admitted to the hospital on 05-05-2022 due to Fecal impaction. Discharge date was 05-05-2022. Discharged from [facility] ED.   New?Medications Started at [facility] Discharge:?? Miralax 17 grams daily Metamucil 1 pack 3 times daily   Medication Changes at Hospital Discharge: None   Medications Discontinued at  Hospital Discharge: None   Medications that remain the same after Hospital Discharge:??  -All other medications will remain the same.     Hospital visits:  Medication Reconciliation was completed by comparing discharge summary, patient's EMR and Pharmacy list, and upon discussion with patient.   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 Discharge date was 03-27-2022. Discharged from [facility] ED.   New?Medications Started at [facility] Discharge:?? Bactrim 800-160 mg twice daily Flomax 0.4 mg daily   Medication Changes at Hospital Discharge: None   Medications Discontinued at Hospital Discharge: None   Medications that remain the same after Hospital Discharge:??  -All other medications will remain the same.      Medications: Outpatient Encounter Medications as of 06/18/2022  Medication Sig   aspirin EC 81 MG tablet Take 81 mg by mouth daily. Swallow whole.   atorvastatin (LIPITOR) 40 MG tablet TAKE 1 TABLET(40 MG) BY MOUTH DAILY   cholecalciferol (VITAMIN D3) 25 MCG (1000 UNIT) tablet Take 1,000 Units by mouth daily.   docusate sodium (COLACE) 100 MG capsule Take 1 capsule (100 mg total) by mouth 2 (two) times daily.   esomeprazole (NEXIUM) 20 MG capsule Take 20 mg by mouth daily as needed (acid reflux).   glucose blood (ONETOUCH ULTRA) test strip Use as instructed   glucose blood test strip Use as instructed to test blood sugars three times a day DX:E11.65   JARDIANCE 10 MG TABS tablet TAKE 1 TABLET(10 MG) BY MOUTH DAILY BEFORE BREAKFAST   Lidocaine 4 % GEL Apply 1 Application topically daily as needed (pain).   linaclotide (LINZESS) 72 MCG capsule Take 1 capsule (72 mcg total) by mouth daily before breakfast.   metFORMIN (GLUCOPHAGE) 1000 MG tablet TAKE 1 TABLET(1000 MG) BY MOUTH TWICE DAILY WITH A MEAL   Multiple Vitamin (  MULTIVITAMIN WITH MINERALS) TABS tablet Take 1 tablet by mouth daily. One a Day Men's   Multiple Vitamins-Minerals (PRESERVISION AREDS 2)  CAPS Take 1 capsule by mouth 2 (two) times daily.   naloxone (NARCAN) nasal spray 4 mg/0.1 mL Place 1 spray into the nose as needed (opioid overdose).   Olmesartan-amLODIPine-HCTZ 40-10-12.5 MG TABS TAKE 1 TABLET BY MOUTH DAILY   Omega-3 Fatty Acids (FISH OIL) 1200 MG CAPS Take 1,200 mg by mouth daily.   polyethylene glycol (MIRALAX) 17 g packet Take 17 g by mouth daily.   psyllium (METAMUCIL SMOOTH TEXTURE) 58.6 % powder Take 1 packet by mouth 3 (three) times daily.   spironolactone (ALDACTONE) 25 MG tablet Take 0.5 tablets (12.5 mg total) by mouth once for 1 dose. Take one tablet by mouth daily   tamsulosin (FLOMAX) 0.4 MG CAPS capsule Take 1 capsule (0.4 mg total) by mouth daily.   valACYclovir (VALTREX) 500 MG tablet Take 500 mg by mouth daily.   No facility-administered encounter medications on file as of 06/18/2022.    Recent Relevant Labs: Lab Results  Component Value Date/Time   HGBA1C 6.6 (H) 03/20/2022 11:56 AM   HGBA1C 6.4 (H) 11/19/2021 11:40 AM   MICROALBUR 30 07/08/2019 12:51 PM   MICROALBUR 80 06/25/2018 12:39 PM    Kidney Function Lab Results  Component Value Date/Time   CREATININE 1.03 05/05/2022 04:59 AM   CREATININE 1.04 03/28/2022 12:15 AM   GFRNONAA >60 05/05/2022 04:59 AM   GFRAA 70 01/10/2020 12:24 PM  06-18-2022: 1st attempt patient will call back later  Star Rating Drugs:  Atorvastatin 40 mg- Last filled 03-10-2022 90 DS [name]. Previous 12-12-2021 90 DS. Jardiance 10 mg- Lat filled 05-20-2022 30 DS [name]. Previous 04-13-2022 30 DS. Metformin 1000 mg- Last filled 03-31-2022 90 DS [name]. Previous 12-17-2021 90 DS Olm/Amlo/HCTZ 40-10-12.5 mg- Last filled 06-08-2022 90 DS [name]. Previous 03-07-2022 90 DS.   Care Gaps: Annual wellness visit in last year? Yes Last eye exam / retinopathy screening:01-03-2022  Last diabetic foot exam: None   Huey Romans [facility] Clinical Pharmacist Assistant [phone]

## 2022-07-29 ENCOUNTER — Ambulatory Visit (INDEPENDENT_AMBULATORY_CARE_PROVIDER_SITE_OTHER): Payer: PPO | Admitting: Nurse Practitioner

## 2022-07-29 ENCOUNTER — Encounter: Payer: Self-pay | Admitting: Nurse Practitioner

## 2022-07-29 VITALS — BP 128/60 | HR 98 | Temp 98.3°F | Ht 69.0 in | Wt 146.4 lb

## 2022-07-29 DIAGNOSIS — N3000 Acute cystitis without hematuria: Secondary | ICD-10-CM

## 2022-07-29 DIAGNOSIS — Z0000 Encounter for general adult medical examination without abnormal findings: Secondary | ICD-10-CM | POA: Insufficient documentation

## 2022-07-29 DIAGNOSIS — E785 Hyperlipidemia, unspecified: Secondary | ICD-10-CM | POA: Diagnosis not present

## 2022-07-29 DIAGNOSIS — E782 Mixed hyperlipidemia: Secondary | ICD-10-CM | POA: Diagnosis not present

## 2022-07-29 DIAGNOSIS — E1169 Type 2 diabetes mellitus with other specified complication: Secondary | ICD-10-CM

## 2022-07-29 DIAGNOSIS — Z79899 Other long term (current) drug therapy: Secondary | ICD-10-CM

## 2022-07-29 DIAGNOSIS — Z6821 Body mass index (BMI) 21.0-21.9, adult: Secondary | ICD-10-CM

## 2022-07-29 DIAGNOSIS — I10 Essential (primary) hypertension: Secondary | ICD-10-CM

## 2022-07-29 DIAGNOSIS — R634 Abnormal weight loss: Secondary | ICD-10-CM | POA: Diagnosis not present

## 2022-07-29 LAB — POCT URINALYSIS DIP (CLINITEK)
Bilirubin, UA: NEGATIVE
Blood, UA: NEGATIVE
Glucose, UA: 500 mg/dL — AB
Ketones, POC UA: NEGATIVE mg/dL
Nitrite, UA: POSITIVE — AB
POC PROTEIN,UA: NEGATIVE
Spec Grav, UA: 1.015 (ref 1.010–1.025)
Urobilinogen, UA: 0.2 E.U./dL
pH, UA: 7 (ref 5.0–8.0)

## 2022-07-29 MED ORDER — DOXYCYCLINE MONOHYDRATE 100 MG PO CAPS
100.0000 mg | ORAL_CAPSULE | Freq: Two times a day (BID) | ORAL | 0 refills | Status: DC
Start: 2022-07-29 — End: 2023-08-06

## 2022-07-29 MED ORDER — METFORMIN HCL 1000 MG PO TABS: ORAL_TABLET | ORAL | 2 refills | Status: DC

## 2022-07-29 NOTE — Progress Notes (Signed)
Madelaine Bhat, CMA,acting as a Neurosurgeon for Arnette Felts, FNP.,have documented all relevant documentation on the behalf of Arnette Felts, FNP,as directed by  Arnette Felts, FNP while in the presence of Arnette Felts, FNP.  Subjective:   Patient ID: Ricky Shields , male    DOB: March 19, 1945 , 77 y.o.   MRN: [id number]  Chief Complaint  Patient presents with   Annual Exam    HPI  Patient presents today for HM, patient reports compliance with medications. Patient denies any chest pain, SOB, or headaches. Patient has no concerns today. He had his colonoscopy done in the past by Dr. Mena Goes.   BP Readings from Last 3 Encounters: 07/29/22 : 128/60 05/31/22 : 120/60 05/05/22 : 136/84   Wt Readings from Last 3 Encounters: 07/29/22 : 146 lb 6.4 oz (66.4 kg) 05/31/22 : 150 lb 3.2 oz (68.1 kg) 05/05/22 : 160 lb 0.9 oz (72.6 kg)       Past Medical History:  Diagnosis Date   Back pain    intermittent-prior military injury   BPH (benign prostatic hypertrophy) with urinary retention    Diabetes mellitus without complication (HCC)    GERD (gastroesophageal reflux disease)    High cholesterol    Hypertension      Family History  Problem Relation Age of Onset   Stroke Mother    Stroke Father      Current Outpatient Medications:    aspirin EC 81 MG tablet, Take 81 mg by mouth daily. Swallow whole., Disp: , Rfl:    atorvastatin (LIPITOR) 40 MG tablet, TAKE 1 TABLET(40 MG) BY MOUTH DAILY, Disp: 90 tablet, Rfl: 1   cholecalciferol (VITAMIN D3) 25 MCG (1000 UNIT) tablet, Take 1,000 Units by mouth daily., Disp: , Rfl:    docusate sodium (COLACE) 100 MG capsule, Take 1 capsule (100 mg total) by mouth 2 (two) times daily., Disp: 30 capsule, Rfl: 0   doxycycline (MONODOX) 100 MG capsule, Take 1 capsule (100 mg total) by mouth 2 (two) times daily., Disp: 20 capsule, Rfl: 0   esomeprazole (NEXIUM) 20 MG capsule, Take 20 mg by mouth daily as needed (acid reflux)., Disp: , Rfl:    glucose blood  (ONETOUCH ULTRA) test strip, Use as instructed, Disp: 100 each, Rfl: 2   glucose blood test strip, Use as instructed to test blood sugars three times a day DX:E11.65, Disp: 100 each, Rfl: 12   JARDIANCE 10 MG TABS tablet, TAKE 1 TABLET(10 MG) BY MOUTH DAILY BEFORE BREAKFAST, Disp: 30 tablet, Rfl: 5   Lidocaine 4 % GEL, Apply 1 Application topically daily as needed (pain)., Disp: , Rfl:    linaclotide (LINZESS) 72 MCG capsule, Take 1 capsule (72 mcg total) by mouth daily before breakfast., Disp: 90 capsule, Rfl: 1   Multiple Vitamin (MULTIVITAMIN WITH MINERALS) TABS tablet, Take 1 tablet by mouth daily. One a Day Men's, Disp: , Rfl:    Multiple Vitamins-Minerals (PRESERVISION AREDS 2) CAPS, Take 1 capsule by mouth 2 (two) times daily., Disp: , Rfl:    naloxone (NARCAN) nasal spray 4 mg/0.1 mL, Place 1 spray into the nose as needed (opioid overdose)., Disp: , Rfl:    Olmesartan-amLODIPine-HCTZ 40-10-12.5 MG TABS, TAKE 1 TABLET BY MOUTH DAILY, Disp: 90 tablet, Rfl: 1   Omega-3 Fatty Acids (FISH OIL) 1200 MG CAPS, Take 1,200 mg by mouth daily., Disp: , Rfl:    polyethylene glycol (MIRALAX) 17 g packet, Take 17 g by mouth daily., Disp: 14 each, Rfl: 0   psyllium (  METAMUCIL SMOOTH TEXTURE) 58.6 % powder, Take 1 packet by mouth 3 (three) times daily., Disp: 283 g, Rfl: 12   tamsulosin (FLOMAX) 0.4 MG CAPS capsule, Take 1 capsule (0.4 mg total) by mouth daily., Disp: 30 capsule, Rfl: 0   valACYclovir (VALTREX) 500 MG tablet, Take 500 mg by mouth daily., Disp: , Rfl:    metFORMIN (GLUCOPHAGE) 1000 MG tablet, TAKE 1 TABLET(1000 MG) BY MOUTH TWICE DAILY WITH A MEAL, Disp: 180 tablet, Rfl: 2   spironolactone (ALDACTONE) 25 MG tablet, Take 0.5 tablets (12.5 mg total) by mouth once for 1 dose. Take one tablet by mouth daily, Disp: 90 tablet, Rfl: 1   Allergies  Allergen Reactions   Penicillins Rash    Has patient had a PCN reaction causing immediate rash, facial/tongue/throat swelling, SOB or lightheadedness  with hypotension: No Has patient had a PCN reaction causing severe rash involving mucus membranes or skin necrosis: No Has patient had a PCN reaction that required hospitalization No Has patient had a PCN reaction occurring within the last 10 years: No If all of the above answers are "NO", then may proceed with Cephalosporin use.      Men's preventive visit. Patient Health Questionnaire (PHQ-2) is  Flowsheet Row Office Visit from 07/29/2022 in [facility]  PHQ-2 Total Score 0      Patient is on a Regular diet; tries to eat healthy. Exercising - treadmill and stationary bike and dumbbells approximately 1.5 hours 3 days a week. States "I try to stay active". Marital status: Married. Relevant history for alcohol use is:  Social History   Substance and Sexual Activity  Alcohol Use No   Alcohol/week: 0.0 standard drinks of alcohol  . Relevant history for tobacco use is:  Social History   Tobacco Use  Smoking Status Never  Smokeless Tobacco Never  .   Review of Systems  Constitutional:  Positive for unexpected weight change.  HENT: Negative.    Eyes: Negative.   Respiratory: Negative.    Cardiovascular: Negative.   Gastrointestinal: Negative.   Endocrine: Negative.   Genitourinary: Negative.   Musculoskeletal: Negative.   Skin: Negative.   Allergic/Immunologic: Negative.   Hematological: Negative.   Psychiatric/Behavioral: Negative.       Today's Vitals   07/29/22 1001  BP: 128/60  Pulse: 98  Temp: 98.3 F (36.8 C)  TempSrc: Oral  Weight: 146 lb 6.4 oz (66.4 kg)  Height: 5\' 9"  (1.753 m)  PainSc: 0-No pain   Body mass index is 21.62 kg/m.  Wt Readings from Last 3 Encounters:  07/29/22 146 lb 6.4 oz (66.4 kg)  05/31/22 150 lb 3.2 oz (68.1 kg)  05/05/22 160 lb 0.9 oz (72.6 kg)    Objective:  Physical Exam Vitals reviewed.  Constitutional:      General: He is not in acute distress.    Appearance: Normal appearance.  HENT:      Head: Normocephalic and atraumatic.     Right Ear: Tympanic membrane, ear canal and external ear normal. There is no impacted cerumen.     Left Ear: Tympanic membrane, ear canal and external ear normal. There is no impacted cerumen.     Nose: Nose normal.     Mouth/Throat:     Mouth: Mucous membranes are moist.  Cardiovascular:     Rate and Rhythm: Normal rate and regular rhythm.     Pulses: Normal pulses.     Heart sounds: Normal heart sounds. No murmur heard. Pulmonary:  Effort: Pulmonary effort is normal. No respiratory distress.     Breath sounds: Normal breath sounds. No wheezing.  Abdominal:     General: Abdomen is flat. Bowel sounds are normal. There is no distension.     Palpations: Abdomen is soft.     Tenderness: There is no abdominal tenderness.  Genitourinary:    Comments: Deferred - sees Urologist Musculoskeletal:        General: Normal range of motion.     Cervical back: Normal range of motion and neck supple.  Skin:    General: Skin is warm.     Capillary Refill: Capillary refill takes less than 2 seconds.  Neurological:     General: No focal deficit present.     Mental Status: He is alert and oriented to person, place, and time.     Cranial Nerves: No cranial nerve deficit.     Motor: No weakness.  Psychiatric:        Mood and Affect: Mood normal.        Behavior: Behavior normal.        Thought Content: Thought content normal.        Judgment: Judgment normal.         Assessment And Plan:    Encounter for annual health examination Assessment & Plan: Behavior modifications discussed and diet history reviewed.   Pt will continue to exercise regularly and modify diet with low GI, plant based foods and decrease intake of processed foods.  Recommend intake of daily multivitamin, Vitamin D, and calcium.  Recommend colonoscopy for preventive screenings, as well as recommend immunizations that include influenza, TDAP, and Shingles    Type 2 diabetes  mellitus with hyperlipidemia (HCC) Assessment & Plan: HgbA1c is improving slightly, continue current medications  Orders: -     POCT URINALYSIS DIP (CLINITEK) -     Microalbumin / creatinine urine ratio -     EKG 12-Lead -     CMP14+EGFR -     Hemoglobin A1c  Essential hypertension Assessment & Plan: Blood pressure is well controlled, continue current medications. EKG done NSR HR 72  Orders: -     POCT URINALYSIS DIP (CLINITEK) -     Microalbumin / creatinine urine ratio -     EKG 12-Lead  Mixed hyperlipidemia Assessment & Plan: Cholesterol levels are stable. Continue statin, tolerating well.   Orders: -     Lipid panel  Body mass index (BMI) of 21.0 to 21.9 in adult  Abnormal weight loss Assessment & Plan: His weight loss has been fluctuating between 10 pounds up and down over the last year however he had a significant weight loss in the months of April and May he is concerned about this weight loss will refer to GI for further evaluation for reassurance.  Orders: -     CBC with Differential/Platelet -     Hemoglobin A1c -     TSH -     Ambulatory referral to Gastroenterology  Acute cystitis without hematuria Assessment & Plan: Positive for nitrates, will treat with antibiotic and send urine culture.   Orders: -     Doxycycline Monohydrate; Take 1 capsule (100 mg total) by mouth 2 (two) times daily.  Dispense: 20 capsule; Refill: 0  Other long term (current) drug therapy -     TSH  Other orders -     metFORMIN HCl; TAKE 1 TABLET(1000 MG) BY MOUTH TWICE DAILY WITH A MEAL  Dispense: 180 tablet; Refill: 2  Return for 1 year physical, uncontrolled bp 3-16months check. Patient was given opportunity to ask questions. Patient verbalized understanding of the plan and was able to repeat key elements of the plan. All questions were answered to their satisfaction.   Arnette Felts, FNP   I, Arnette Felts, FNP, have reviewed all documentation for this visit. The  documentation on 07/29/22 for the exam, diagnosis, procedures, and orders are all accurate and complete.

## 2022-07-29 NOTE — Assessment & Plan Note (Signed)
His weight loss has been fluctuating between 10 pounds up and down over the last year however he had a significant weight loss in the months of April and May he is concerned about this weight loss will refer to GI for further evaluation for reassurance.

## 2022-07-30 LAB — CBC WITH DIFFERENTIAL/PLATELET
Basophils Absolute: 0 10*3/uL (ref 0.0–0.2)
Basos: 1 %
EOS (ABSOLUTE): 0.1 10*3/uL (ref 0.0–0.4)
Eos: 2 %
Hematocrit: 35.4 % — ABNORMAL LOW (ref 37.5–51.0)
Hemoglobin: 10.8 g/dL — ABNORMAL LOW (ref 13.0–17.7)
Immature Grans (Abs): 0 10*3/uL (ref 0.0–0.1)
Immature Granulocytes: 0 %
Lymphocytes Absolute: 1.7 10*3/uL (ref 0.7–3.1)
Lymphs: 34 %
MCH: 24.7 pg — ABNORMAL LOW (ref 26.6–33.0)
MCHC: 30.5 g/dL — ABNORMAL LOW (ref 31.5–35.7)
MCV: 81 fL (ref 79–97)
Monocytes Absolute: 0.4 10*3/uL (ref 0.1–0.9)
Monocytes: 9 %
Neutrophils Absolute: 2.6 10*3/uL (ref 1.4–7.0)
Neutrophils: 54 %
Platelets: 304 10*3/uL (ref 150–450)
RBC: 4.38 x10E6/uL (ref 4.14–5.80)
RDW: 16.4 % — ABNORMAL HIGH (ref 11.6–15.4)
WBC: 4.9 10*3/uL (ref 3.4–10.8)

## 2022-07-30 LAB — CMP14+EGFR
ALT: 16 IU/L (ref 0–44)
AST: 22 IU/L (ref 0–40)
Albumin: 4.6 g/dL (ref 3.8–4.8)
Alkaline Phosphatase: 79 IU/L (ref 44–121)
BUN/Creatinine Ratio: 13 (ref 10–24)
BUN: 15 mg/dL (ref 8–27)
Bilirubin Total: 0.5 mg/dL (ref 0.0–1.2)
CO2: 21 mmol/L (ref 20–29)
Calcium: 10.4 mg/dL — ABNORMAL HIGH (ref 8.6–10.2)
Chloride: 99 mmol/L (ref 96–106)
Creatinine, Ser: 1.19 mg/dL (ref 0.76–1.27)
Globulin, Total: 2.8 g/dL (ref 1.5–4.5)
Glucose: 114 mg/dL — ABNORMAL HIGH (ref 70–99)
Potassium: 4.7 mmol/L (ref 3.5–5.2)
Sodium: 138 mmol/L (ref 134–144)
Total Protein: 7.4 g/dL (ref 6.0–8.5)
eGFR: 63 mL/min/{1.73_m2} (ref >59)

## 2022-07-30 LAB — LIPID PANEL
Chol/HDL Ratio: 2.2 ratio (ref 0.0–5.0)
Cholesterol, Total: 144 mg/dL (ref 100–199)
HDL: 66 mg/dL (ref >39)
LDL Chol Calc (NIH): 66 mg/dL (ref 0–99)
Triglycerides: 58 mg/dL (ref 0–149)
VLDL Cholesterol Cal: 12 mg/dL (ref 5–40)

## 2022-07-30 LAB — TSH: TSH: 0.933 u[IU]/mL (ref 0.450–4.500)

## 2022-07-30 LAB — MICROALBUMIN / CREATININE URINE RATIO
Creatinine, Urine: 54.6 mg/dL
Microalb/Creat Ratio: 29 mg/g creat (ref 0–29)
Microalbumin, Urine: 16 ug/mL

## 2022-07-30 LAB — HEMOGLOBIN A1C
Est. average glucose Bld gHb Est-mCnc: 143 mg/dL
Hgb A1c MFr Bld: 6.6 % — ABNORMAL HIGH (ref 4.8–5.6)

## 2022-08-12 ENCOUNTER — Encounter: Payer: Self-pay | Admitting: Nurse Practitioner

## 2022-08-12 DIAGNOSIS — N3000 Acute cystitis without hematuria: Secondary | ICD-10-CM | POA: Insufficient documentation

## 2022-08-12 NOTE — Assessment & Plan Note (Signed)
Positive for nitrates, will treat with antibiotic and send urine culture.

## 2022-08-12 NOTE — Assessment & Plan Note (Addendum)
Blood pressure is well controlled, continue current medications. EKG done NSR HR 72

## 2022-08-12 NOTE — Assessment & Plan Note (Signed)
Behavior modifications discussed and diet history reviewed.   Pt will continue to exercise regularly and modify diet with low GI, plant based foods and decrease intake of processed foods.  Recommend intake of daily multivitamin, Vitamin D, and calcium.  Recommend colonoscopy for preventive screenings, as well as recommend immunizations that include influenza, TDAP, and Shingles  

## 2022-08-12 NOTE — Assessment & Plan Note (Signed)
Cholesterol levels are stable.  Continue statin, tolerating well.

## 2022-08-12 NOTE — Assessment & Plan Note (Signed)
HgbA1c is improving slightly, continue current medications

## 2022-08-21 DIAGNOSIS — K5900 Constipation, unspecified: Secondary | ICD-10-CM | POA: Diagnosis not present

## 2022-08-21 DIAGNOSIS — R634 Abnormal weight loss: Secondary | ICD-10-CM | POA: Diagnosis not present

## 2022-08-21 DIAGNOSIS — E119 Type 2 diabetes mellitus without complications: Secondary | ICD-10-CM | POA: Diagnosis not present

## 2022-08-22 ENCOUNTER — Ambulatory Visit (INDEPENDENT_AMBULATORY_CARE_PROVIDER_SITE_OTHER): Payer: PPO

## 2022-08-22 ENCOUNTER — Ambulatory Visit: Payer: PPO | Admitting: Nurse Practitioner

## 2022-08-22 DIAGNOSIS — Z0000 Encounter for general adult medical examination without abnormal findings: Secondary | ICD-10-CM | POA: Diagnosis not present

## 2022-08-22 NOTE — Progress Notes (Signed)
Subjective:   Ricky Shields is a 77 y.o. male who presents for Medicare Annual/Subsequent preventive examination.  Visit Complete: Virtual  I connected with  Ricky Shields on 08/22/22 by a audio enabled telemedicine application and verified that I am speaking with the correct person using two identifiers.  Patient Location: Home  Provider Location: Office/Clinic  I discussed the limitations of evaluation and management by telemedicine. The patient expressed understanding and agreed to proceed.  Vital Signs: Unable to obtain new vitals due to this being a telehealth visit.  Review of Systems     Cardiac Risk Factors include: advanced age (>70men, >37 women);diabetes mellitus;dyslipidemia;hypertension;male gender     Objective:    Today's Vitals   08/22/22 1045  PainSc: 2    There is no height or weight on file to calculate BMI.     08/22/2022   10:51 AM 03/27/2022   11:34 PM 08/16/2021   11:28 AM 08/09/2021    2:33 PM 08/09/2021   10:38 AM 07/13/2020   11:05 AM 07/08/2019   11:35 AM  Advanced Directives  Does Patient Have a Medical Advance Directive? No No No No No No Yes  Type of Tax inspector;Living will  Copy of Healthcare Power of Attorney in Chart?       No - copy requested  Would patient like information on creating a medical advance directive?  No - Patient declined No - Patient declined No - Patient declined No - Patient declined      Current Medications (verified) Outpatient Encounter Medications as of 08/22/2022  Medication Sig   aspirin EC 81 MG tablet Take 81 mg by mouth daily. Swallow whole.   atorvastatin (LIPITOR) 40 MG tablet TAKE 1 TABLET(40 MG) BY MOUTH DAILY   cholecalciferol (VITAMIN D3) 25 MCG (1000 UNIT) tablet Take 1,000 Units by mouth daily.   docusate sodium (COLACE) 100 MG capsule Take 1 capsule (100 mg total) by mouth 2 (two) times daily.   esomeprazole (NEXIUM) 20 MG capsule Take 20 mg by mouth daily as  needed (acid reflux).   glucose blood (ONETOUCH ULTRA) test strip Use as instructed   glucose blood test strip Use as instructed to test blood sugars three times a day DX:E11.65   JARDIANCE 10 MG TABS tablet TAKE 1 TABLET(10 MG) BY MOUTH DAILY BEFORE BREAKFAST   Lidocaine 4 % GEL Apply 1 Application topically daily as needed (pain).   linaclotide (LINZESS) 72 MCG capsule Take 1 capsule (72 mcg total) by mouth daily before breakfast.   metFORMIN (GLUCOPHAGE) 1000 MG tablet TAKE 1 TABLET(1000 MG) BY MOUTH TWICE DAILY WITH A MEAL   Multiple Vitamin (MULTIVITAMIN WITH MINERALS) TABS tablet Take 1 tablet by mouth daily. One a Day Men's   Multiple Vitamins-Minerals (PRESERVISION AREDS 2) CAPS Take 1 capsule by mouth 2 (two) times daily.   Olmesartan-amLODIPine-HCTZ 40-10-12.5 MG TABS TAKE 1 TABLET BY MOUTH DAILY   Omega-3 Fatty Acids (FISH OIL) 1200 MG CAPS Take 1,200 mg by mouth daily.   polyethylene glycol (MIRALAX) 17 g packet Take 17 g by mouth daily.   psyllium (METAMUCIL SMOOTH TEXTURE) 58.6 % powder Take 1 packet by mouth 3 (three) times daily.   tamsulosin (FLOMAX) 0.4 MG CAPS capsule Take 1 capsule (0.4 mg total) by mouth daily.   valACYclovir (VALTREX) 500 MG tablet Take 500 mg by mouth daily.   doxycycline (MONODOX) 100 MG capsule Take 1 capsule (100 mg total) by  mouth 2 (two) times daily. (Patient not taking: Reported on 08/22/2022)   naloxone [facility]) nasal spray 4 mg/0.1 mL Place 1 spray into the nose as needed (opioid overdose). (Patient not taking: Reported on 08/22/2022)   spironolactone (ALDACTONE) 25 MG tablet Take 0.5 tablets (12.5 mg total) by mouth once for 1 dose. Take one tablet by mouth daily   No facility-administered encounter medications on file as of 08/22/2022.    Allergies (verified) Penicillins   History: Past Medical History:  Diagnosis Date   Back pain    intermittent-prior military injury   BPH (benign prostatic hypertrophy) with urinary retention    Diabetes  mellitus without complication (HCC)    GERD (gastroesophageal reflux disease)    High cholesterol    Hypertension    Past Surgical History:  Procedure Laterality Date   APPENDECTOMY     COLONOSCOPY     GREEN LIGHT LASER TURP (TRANSURETHRAL RESECTION OF PROSTATE N/A 10/21/2014   Procedure: GREEN LIGHT LASER TURP (TRANSURETHRAL RESECTION OF PROSTATE;  Surgeon: Jerilee Field, MD;  Location: [facility];  Service: Urology;  Laterality: N/A;   LUMBAR LAMINECTOMY/DECOMPRESSION MICRODISCECTOMY Bilateral 08/16/2021   Procedure: BILATERAL LUMBAR THREE-FOUR, LUMBAR FOUR-FIVE LAMINECTOMY Jackelyn Hoehn; POSS DISKECTOMY;  Surgeon: Tressie Stalker, MD;  Location: [facility] OR;  Service: Neurosurgery;  Laterality: Bilateral;   Family History  Problem Relation Age of Onset   Stroke Mother    Stroke Father    Social History   Socioeconomic History   Marital status: Married    Spouse name: Not on file   Number of children: Not on file   Years of education: Not on file   Highest education level: Not on file  Occupational History   Occupation: retired  Tobacco Use   Smoking status: Never   Smokeless tobacco: Never  Vaping Use   Vaping status: Never Used  Substance and Sexual Activity   Alcohol use: No    Alcohol/week: 0.0 standard drinks of alcohol   Drug use: No   Sexual activity: Yes  Other Topics Concern   Not on file  Social History Narrative   Not on file   Social Determinants of Health   Financial Resource Strain: Low Risk  (08/22/2022)   Overall Financial Resource Strain (CARDIA)    Difficulty of Paying Living Expenses: Not hard at all  Food Insecurity: No Food Insecurity (08/22/2022)   Hunger Vital Sign    Worried About Running Out of Food in the Last Year: Never true    Ran Out of Food in the Last Year: Never true  Transportation Needs: No Transportation Needs (08/22/2022)   PRAPARE - Administrator, Civil Service (Medical): No    Lack of Transportation  (Non-Medical): No  Physical Activity: Insufficiently Active (08/22/2022)   Exercise Vital Sign    Days of Exercise per Week: 2 days    Minutes of Exercise per Session: 30 min  Stress: No Stress Concern Present (08/22/2022)   [employer] of Occupational Health - Occupational Stress Questionnaire    Feeling of Stress : Not at all  Social Connections: Socially Integrated (08/22/2022)   Social Connection and Isolation Panel [NHANES]    Frequency of Communication with Friends and Family: More than three times a week    Frequency of Social Gatherings with Friends and Family: Three times a week    Attends Religious Services: More than 4 times per year    Active Member of Clubs or Organizations: Yes    Attends Club  or Organization Meetings: More than 4 times per year    Marital Status: Married    Tobacco Counseling Counseling given: Not Answered   Clinical Intake:  Pre-visit preparation completed: Yes  Pain : 0-10 Pain Score: 2  Pain Type: Chronic pain Pain Location: Back Pain Orientation: Lower Pain Descriptors / Indicators: Aching Pain Onset: More than a month ago Pain Frequency: Constant     Nutritional Risks: None Diabetes: Yes CBG done?: No Did pt. bring in CBG monitor from home?: No  How often do you need to have someone help you when you read instructions, pamphlets, or other written materials from your doctor or pharmacy?: 1 - Never  Interpreter Needed?: No  Information entered by :: NAllen LPN   Activities of Daily Living    08/22/2022   10:46 AM  In your present state of health, do you have any difficulty performing the following activities:  Hearing? 0  Vision? 0  Difficulty concentrating or making decisions? 0  Walking or climbing stairs? 0  Dressing or bathing? 0  Doing errands, shopping? 0  Preparing Food and eating ? N  Using the Toilet? N  In the past six months, have you accidently leaked urine? N  Do you have problems with loss of bowel control?  N  Managing your Medications? N  Managing your Finances? N  Housekeeping or managing your Housekeeping? N    Patient Care Team: Arnette Felts, FNP as PCP - General (General Practice) Harlan Stains, RPH (Inactive) (Pharmacist) Chalmers Guest, MD as Consulting Physician (Ophthalmology)  Indicate any recent Medical Services you may have received from other than [facility] providers in the past year (date may be approximate).     Assessment:   This is a routine wellness examination for Sriyansh.  Hearing/Vision screen Hearing Screening - Comments:: Denies hearing issues Vision Screening - Comments:: Regular eye exams, Dr. Harlon Flor  Dietary issues and exercise activities discussed:     Goals Addressed             This Visit's Progress    Patient Stated       08/22/2022, trying to get better       Depression Screen    08/22/2022   10:53 AM 07/29/2022   10:00 AM 05/31/2022    9:57 AM 03/20/2022   11:10 AM 11/19/2021   10:51 AM 08/09/2021    2:34 PM 07/18/2021   10:31 AM  PHQ 2/9 Scores  PHQ - 2 Score 0 0 0 0 0 0 0  PHQ- 9 Score 0 0 0        Fall Risk    08/22/2022   10:52 AM 07/29/2022   10:00 AM 05/31/2022    9:57 AM 03/20/2022   11:09 AM 11/19/2021   10:51 AM  Fall Risk   Falls in the past year? 0 0 0 0 0  Number falls in past yr: 0 0 0  0  Injury with Fall? 0 0 0  0  Risk for fall due to : Medication side effect No Fall Risks No Fall Risks  No Fall Risks  Follow up Falls prevention discussed;Falls evaluation completed Falls evaluation completed;Education provided Falls evaluation completed  Falls evaluation completed    MEDICARE RISK AT HOME:  Medicare Risk at Home - 08/22/22 1052     Any stairs in or around the home? Yes    If so, are there any without handrails? No    Home free of loose throw rugs in walkways, pet  beds, electrical cords, etc? Yes    Adequate lighting in your home to reduce risk of falls? Yes    Life alert? No    Use of a cane, walker or w/c? No     Grab bars in the bathroom? No    Shower chair or bench in shower? No    Elevated toilet seat or a handicapped toilet? No             TIMED UP AND GO:  Was the test performed?  No    Cognitive Function:        08/22/2022   10:53 AM 08/09/2021    2:35 PM 07/13/2020   11:07 AM 07/08/2019   11:37 AM 06/25/2018   11:00 AM  6CIT Screen  What Year? 0 points 0 points 0 points 0 points 0 points  What month? 0 points 0 points 0 points 0 points 0 points  What time? 0 points 0 points 0 points 0 points 0 points  Count back from 20 0 points 0 points 0 points 0 points 0 points  Months in reverse 4 points 2 points 4 points 0 points 0 points  Repeat phrase 2 points 6 points 6 points 4 points 2 points  Total Score 6 points 8 points 10 points 4 points 2 points    Immunizations Immunization History  Administered Date(s) Administered   Fluad Quad(high Dose 65+) 09/30/2018, 10/19/2019   Influenza, High Dose Seasonal PF 10/10/2017, 10/05/2021   Influenza-Unspecified 11/10/2017, 10/26/2020   PFIZER Comirnaty(Gray Top)Covid-19 Tri-Sucrose Vaccine 10/05/2021   PFIZER(Purple Top)SARS-COV-2 Vaccination 02/19/2019, 03/12/2019, 11/13/2019, 12/14/2019, 10/26/2020   Pfizer Covid-19 Vaccine Bivalent Booster 42yrs & up 07/11/2021   Pneumococcal Conjugate-13 09/30/2018   Pneumococcal Polysaccharide-23 10/17/2015   Pneumococcal-Unspecified 11/02/2012, 02/09/2014   Tdap 11/02/2012   Zoster Recombinant(Shingrix) 11/22/2020, 01/25/2021    TDAP status: Up to date  Flu Vaccine status: Due, Education has been provided regarding the importance of this vaccine. Advised may receive this vaccine at local pharmacy or Health Dept. Aware to provide a copy of the vaccination record if obtained from local pharmacy or Health Dept. Verbalized acceptance and understanding.  Pneumococcal vaccine status: Up to date  Covid-19 vaccine status: Completed vaccines  Qualifies for Shingles Vaccine? Yes   Zostavax completed  Yes   Shingrix Completed?: Yes  Screening Tests Health Maintenance  Topic Date Due   INFLUENZA VACCINE  08/15/2022   COVID-19 Vaccine (8 - 2023-24 season) 09/06/2022 (Originally 11/30/2021)   DTaP/Tdap/Td (2 - Td or Tdap) 11/03/2022   OPHTHALMOLOGY EXAM  01/04/2023   HEMOGLOBIN A1C  01/29/2023   FOOT EXAM  03/20/2023   Diabetic kidney evaluation - eGFR measurement  07/29/2023   Diabetic kidney evaluation - Urine ACR  07/29/2023   Medicare Annual Wellness (AWV)  08/22/2023   Pneumonia Vaccine 55+ Years old  Completed   Hepatitis C Screening  Completed   Zoster Vaccines- Shingrix  Completed   HPV VACCINES  Aged Out   Colonoscopy  Discontinued    Health Maintenance  Health Maintenance Due  Topic Date Due   INFLUENZA VACCINE  08/15/2022    Colorectal cancer screening: scheduled for 09/12/2022  Lung Cancer Screening: (Low Dose CT Chest recommended if Age 45-80 years, 20 pack-year currently smoking OR have quit w/in 15years.) does not qualify.   Lung Cancer Screening Referral: no  Additional Screening:  Hepatitis C Screening: does qualify; Completed 06/12/2016  Vision Screening: Recommended annual ophthalmology exams for early detection of glaucoma and other disorders of the  eye. Is the patient up to date with their annual eye exam?  Yes  Who is the provider or what is the name of the office in which the patient attends annual eye exams? Dr. Harlon Flor If pt is not established with a provider, would they like to be referred to a provider to establish care? No .   Dental Screening: Recommended annual dental exams for proper oral hygiene  Diabetic Foot Exam: Diabetic Foot Exam: Completed 03/20/2022  Community Resource Referral / Chronic Care Management: CRR required this visit?  No   CCM required this visit?  No     Plan:     I have personally reviewed and noted the following in the patient's chart:   Medical and social history Use of alcohol, tobacco or illicit drugs   Current medications and supplements including opioid prescriptions. Patient is not currently taking opioid prescriptions. Functional ability and status Nutritional status Physical activity Advanced directives List of other physicians Hospitalizations, surgeries, and ER visits in previous 12 months Vitals Screenings to include cognitive, depression, and falls Referrals and appointments  In addition, I have reviewed and discussed with patient certain preventive protocols, quality metrics, and best practice recommendations. A written personalized care plan for preventive services as well as general preventive health recommendations were provided to patient.     Barb Merino, LPN   01/19/1094   After Visit Summary: (MyChart) Due to this being a telephonic visit, the after visit summary with patients personalized plan was offered to patient via MyChart   Nurse Notes: none

## 2022-08-22 NOTE — Patient Instructions (Signed)
Ricky Shields , Thank you for taking time to come for your Medicare Wellness Visit. I appreciate your ongoing commitment to your health goals. Please review the following plan we discussed and let me know if I can assist you in the future.   Referrals/Orders/Follow-Ups/Clinician Recommendations: none  This is a list of the screening recommended for you and due dates:  Health Maintenance  Topic Date Due   Flu Shot  08/15/2022   COVID-19 Vaccine (8 - 2023-24 season) 09/06/2022*   DTaP/Tdap/Td vaccine (2 - Td or Tdap) 11/03/2022   Eye exam for diabetics  01/04/2023   Hemoglobin A1C  01/29/2023   Complete foot exam   03/20/2023   Yearly kidney function blood test for diabetes  07/29/2023   Yearly kidney health urinalysis for diabetes  07/29/2023   Medicare Annual Wellness Visit  08/22/2023   Pneumonia Vaccine  Completed   Hepatitis C Screening  Completed   Zoster (Shingles) Vaccine  Completed   HPV Vaccine  Aged Out   Colon Cancer Screening  Discontinued  *Topic was postponed. The date shown is not the original due date.    Advanced directives: (ACP Link)Information on Advanced Care Planning can be found at [facility] Advance Health Care Directives Advance Health Care Directives ([email or web address]   Next Medicare Annual Wellness Visit scheduled for next year: No, office will schedule appointment  Preventive Care 65 Years and Older, Male  Preventive care refers to lifestyle choices and visits with your health care provider that can promote health and wellness. What does preventive care include? A yearly physical exam. This is also called an annual well check. Dental exams once or twice a year. Routine eye exams. Ask your health care provider how often you should have your eyes checked. Personal lifestyle choices, including: Daily care of your teeth and gums. Regular physical activity. Eating a healthy diet. Avoiding tobacco and drug use. Limiting alcohol  use. Practicing safe sex. Taking low doses of aspirin every day. Taking vitamin and mineral supplements as recommended by your health care provider. What happens during an annual well check? The services and screenings done by your health care provider during your annual well check will depend on your age, overall health, lifestyle risk factors, and family history of disease. Counseling  Your health care provider may ask you questions about your: Alcohol use. Tobacco use. Drug use. Emotional well-being. Home and relationship well-being. Sexual activity. Eating habits. History of falls. Memory and ability to understand (cognition). Work and work Astronomer. Screening  You may have the following tests or measurements: Height, weight, and BMI. Blood pressure. Lipid and cholesterol levels. These may be checked every 5 years, or more frequently if you are over 45 years old. Skin check. Lung cancer screening. You may have this screening every year starting at age 25 if you have a 30-pack-year history of smoking and currently smoke or have quit within the past 15 years. Fecal occult blood test (FOBT) of the stool. You may have this test every year starting at age 47. Flexible sigmoidoscopy or colonoscopy. You may have a sigmoidoscopy every 5 years or a colonoscopy every 10 years starting at age 75. Prostate cancer screening. Recommendations will vary depending on your family history and other risks. Hepatitis C blood test. Hepatitis B blood test. Sexually transmitted disease (STD) testing. Diabetes screening. This is done by checking your blood sugar (glucose) after you have not eaten for a while (fasting). You may have this done every 1-3 years.  Abdominal aortic aneurysm (AAA) screening. You may need this if you are a current or former smoker. Osteoporosis. You may be screened starting at age 50 if you are at high risk. Talk with your health care provider about your test results,  treatment options, and if necessary, the need for more tests. Vaccines  Your health care provider may recommend certain vaccines, such as: Influenza vaccine. This is recommended every year. Tetanus, diphtheria, and acellular pertussis (Tdap, Td) vaccine. You may need a Td booster every 10 years. Zoster vaccine. You may need this after age 71. Pneumococcal 13-valent conjugate (PCV13) vaccine. One dose is recommended after age 55. Pneumococcal polysaccharide (PPSV23) vaccine. One dose is recommended after age 52. Talk to your health care provider about which screenings and vaccines you need and how often you need them. This information is not intended to replace advice given to you by your health care provider. Make sure you discuss any questions you have with your health care provider. Document Released: 01/27/2015 Document Revised: 09/20/2015 Document Reviewed: 11/01/2014 Elsevier Interactive Patient Education  2017 [employer].  Fall Prevention in the Home Falls can cause injuries. They can happen to people of all ages. There are many things you can do to make your home safe and to help prevent falls. What can I do on the outside of my home? Regularly fix the edges of walkways and driveways and fix any cracks. Remove anything that might make you trip as you walk through a door, such as a raised step or threshold. Trim any bushes or trees on the path to your home. Use bright outdoor lighting. Clear any walking paths of anything that might make someone trip, such as rocks or tools. Regularly check to see if handrails are loose or broken. Make sure that both sides of any steps have handrails. Any raised decks and porches should have guardrails on the edges. Have any leaves, snow, or ice cleared regularly. Use sand or salt on walking paths during winter. Clean up any spills in your garage right away. This includes oil or grease spills. What can I do in the bathroom? Use night  lights. Install grab bars by the toilet and in the tub and shower. Do not use towel bars as grab bars. Use non-skid mats or decals in the tub or shower. If you need to sit down in the shower, use a plastic, non-slip stool. Keep the floor dry. Clean up any water that spills on the floor as soon as it happens. Remove soap buildup in the tub or shower regularly. Attach bath mats securely with double-sided non-slip rug tape. Do not have throw rugs and other things on the floor that can make you trip. What can I do in the bedroom? Use night lights. Make sure that you have a light by your bed that is easy to reach. Do not use any sheets or blankets that are too big for your bed. They should not hang down onto the floor. Have a firm chair that has side arms. You can use this for support while you get dressed. Do not have throw rugs and other things on the floor that can make you trip. What can I do in the kitchen? Clean up any spills right away. Avoid walking on wet floors. Keep items that you use a lot in easy-to-reach places. If you need to reach something above you, use a strong step stool that has a grab bar. Keep electrical cords out of the way. Do not  use floor polish or wax that makes floors slippery. If you must use wax, use non-skid floor wax. Do not have throw rugs and other things on the floor that can make you trip. What can I do with my stairs? Do not leave any items on the stairs. Make sure that there are handrails on both sides of the stairs and use them. Fix handrails that are broken or loose. Make sure that handrails are as long as the stairways. Check any carpeting to make sure that it is firmly attached to the stairs. Fix any carpet that is loose or worn. Avoid having throw rugs at the top or bottom of the stairs. If you do have throw rugs, attach them to the floor with carpet tape. Make sure that you have a light switch at the top of the stairs and the bottom of the stairs. If  you do not have them, ask someone to add them for you. What else can I do to help prevent falls? Wear shoes that: Do not have high heels. Have rubber bottoms. Are comfortable and fit you well. Are closed at the toe. Do not wear sandals. If you use a stepladder: Make sure that it is fully opened. Do not climb a closed stepladder. Make sure that both sides of the stepladder are locked into place. Ask someone to hold it for you, if possible. Clearly mark and make sure that you can see: Any grab bars or handrails. First and last steps. Where the edge of each step is. Use tools that help you move around (mobility aids) if they are needed. These include: Canes. Walkers. Scooters. Crutches. Turn on the lights when you go into a dark area. Replace any light bulbs as soon as they burn out. Set up your furniture so you have a clear path. Avoid moving your furniture around. If any of your floors are uneven, fix them. If there are any pets around you, be aware of where they are. Review your medicines with your doctor. Some medicines can make you feel dizzy. This can increase your chance of falling. Ask your doctor what other things that you can do to help prevent falls. This information is not intended to replace advice given to you by your health care provider. Make sure you discuss any questions you have with your health care provider. Document Released: 10/27/2008 Document Revised: 06/08/2015 Document Reviewed: 02/04/2014 Elsevier Interactive Patient Education  2017 [employer].

## 2022-09-12 DIAGNOSIS — K562 Volvulus: Secondary | ICD-10-CM | POA: Diagnosis not present

## 2022-09-12 DIAGNOSIS — K31819 Angiodysplasia of stomach and duodenum without bleeding: Secondary | ICD-10-CM | POA: Diagnosis not present

## 2022-09-12 DIAGNOSIS — R634 Abnormal weight loss: Secondary | ICD-10-CM | POA: Diagnosis not present

## 2022-09-12 DIAGNOSIS — K592 Neurogenic bowel, not elsewhere classified: Secondary | ICD-10-CM | POA: Diagnosis not present

## 2022-10-08 DIAGNOSIS — M5136 Other intervertebral disc degeneration, lumbar region: Secondary | ICD-10-CM | POA: Diagnosis not present

## 2022-10-08 DIAGNOSIS — M48062 Spinal stenosis, lumbar region with neurogenic claudication: Secondary | ICD-10-CM | POA: Diagnosis not present

## 2022-10-08 DIAGNOSIS — G8929 Other chronic pain: Secondary | ICD-10-CM | POA: Diagnosis not present

## 2022-10-28 NOTE — Progress Notes (Unsigned)
Madelaine Bhat, CMA,acting as a Neurosurgeon for Arnette Felts, FNP.,have documented all relevant documentation on the behalf of Arnette Felts, FNP,as directed by  Arnette Felts, FNP while in the presence of Arnette Felts, FNP.  Subjective:  Patient ID: Ricky Shields , male    DOB: Oct 19, 1945 , 77 y.o.   MRN: [id number]  No chief complaint on file.   HPI  Patient presents today for a bp, chol, and dm follow up, Patient reports compliance with medication. Patient denies any chest pain, SOB, or headaches. Patient has no concerns today.     Past Medical History:  Diagnosis Date  . Back pain    intermittent-prior military injury  . BPH (benign prostatic hypertrophy) with urinary retention   . Diabetes mellitus without complication (HCC)   . GERD (gastroesophageal reflux disease)   . High cholesterol   . Hypertension      Family History  Problem Relation Age of Onset  . Stroke Mother   . Stroke Father      Current Outpatient Medications:  .  aspirin EC 81 MG tablet, Take 81 mg by mouth daily. Swallow whole., Disp: , Rfl:  .  atorvastatin (LIPITOR) 40 MG tablet, TAKE 1 TABLET(40 MG) BY MOUTH DAILY, Disp: 90 tablet, Rfl: 1 .  cholecalciferol (VITAMIN D3) 25 MCG (1000 UNIT) tablet, Take 1,000 Units by mouth daily., Disp: , Rfl:  .  docusate sodium (COLACE) 100 MG capsule, Take 1 capsule (100 mg total) by mouth 2 (two) times daily., Disp: 30 capsule, Rfl: 0 .  doxycycline (MONODOX) 100 MG capsule, Take 1 capsule (100 mg total) by mouth 2 (two) times daily. (Patient not taking: Reported on 08/22/2022), Disp: 20 capsule, Rfl: 0 .  esomeprazole (NEXIUM) 20 MG capsule, Take 20 mg by mouth daily as needed (acid reflux)., Disp: , Rfl:  .  glucose blood (ONETOUCH ULTRA) test strip, Use as instructed, Disp: 100 each, Rfl: 2 .  glucose blood test strip, Use as instructed to test blood sugars three times a day DX:E11.65, Disp: 100 each, Rfl: 12 .  JARDIANCE 10 MG TABS tablet, TAKE 1 TABLET(10 MG) BY MOUTH  DAILY BEFORE BREAKFAST, Disp: 30 tablet, Rfl: 5 .  Lidocaine 4 % GEL, Apply 1 Application topically daily as needed (pain)., Disp: , Rfl:  .  linaclotide (LINZESS) 72 MCG capsule, Take 1 capsule (72 mcg total) by mouth daily before breakfast., Disp: 90 capsule, Rfl: 1 .  metFORMIN (GLUCOPHAGE) 1000 MG tablet, TAKE 1 TABLET(1000 MG) BY MOUTH TWICE DAILY WITH A MEAL, Disp: 180 tablet, Rfl: 2 .  Multiple Vitamin (MULTIVITAMIN WITH MINERALS) TABS tablet, Take 1 tablet by mouth daily. One a Day Men's, Disp: , Rfl:  .  Multiple Vitamins-Minerals (PRESERVISION AREDS 2) CAPS, Take 1 capsule by mouth 2 (two) times daily., Disp: , Rfl:  .  naloxone (NARCAN) nasal spray 4 mg/0.1 mL, Place 1 spray into the nose as needed (opioid overdose). (Patient not taking: Reported on 08/22/2022), Disp: , Rfl:  .  Olmesartan-amLODIPine-HCTZ 40-10-12.5 MG TABS, TAKE 1 TABLET BY MOUTH DAILY, Disp: 90 tablet, Rfl: 1 .  Omega-3 Fatty Acids (FISH OIL) 1200 MG CAPS, Take 1,200 mg by mouth daily., Disp: , Rfl:  .  polyethylene glycol (MIRALAX) 17 g packet, Take 17 g by mouth daily., Disp: 14 each, Rfl: 0 .  psyllium (METAMUCIL SMOOTH TEXTURE) 58.6 % powder, Take 1 packet by mouth 3 (three) times daily., Disp: 283 g, Rfl: 12 .  spironolactone (ALDACTONE) 25 MG tablet, Take  0.5 tablets (12.5 mg total) by mouth once for 1 dose. Take one tablet by mouth daily, Disp: 90 tablet, Rfl: 1 .  tamsulosin (FLOMAX) 0.4 MG CAPS capsule, Take 1 capsule (0.4 mg total) by mouth daily., Disp: 30 capsule, Rfl: 0 .  valACYclovir (VALTREX) 500 MG tablet, Take 500 mg by mouth daily., Disp: , Rfl:    Allergies  Allergen Reactions  . Penicillins Rash    Has patient had a PCN reaction causing immediate rash, facial/tongue/throat swelling, SOB or lightheadedness with hypotension: No Has patient had a PCN reaction causing severe rash involving mucus membranes or skin necrosis: No Has patient had a PCN reaction that required hospitalization No Has patient  had a PCN reaction occurring within the last 10 years: No If all of the above answers are "NO", then may proceed with Cephalosporin use.      Review of Systems   There were no vitals filed for this visit. There is no height or weight on file to calculate BMI.  Wt Readings from Last 3 Encounters:  07/29/22 146 lb 6.4 oz (66.4 kg)  05/31/22 150 lb 3.2 oz (68.1 kg)  05/05/22 160 lb 0.9 oz (72.6 kg)    The 10-year ASCVD risk score (Arnett DK, et al., 2019) is: 34%   Values used to calculate the score:     Age: 45 years     Sex: Male     Is Non-Hispanic African American: Yes     Diabetic: Yes     Tobacco smoker: No     Systolic Blood Pressure: 129 mmHg     Is BP treated: Yes     HDL Cholesterol: 66 mg/dL     Total Cholesterol: 144 mg/dL  Objective:  Physical Exam      Assessment And Plan:  Essential hypertension  Mixed hyperlipidemia  Uncontrolled type 2 diabetes mellitus with hypoglycemia without coma (HCC)    No follow-ups on file.  Patient was given opportunity to ask questions. Patient verbalized understanding of the plan and was able to repeat key elements of the plan. All questions were answered to their satisfaction.    Jeanell Sparrow, FNP, have reviewed all documentation for this visit. The documentation on 10/28/22 for the exam, diagnosis, procedures, and orders are all accurate and complete.   IF YOU HAVE BEEN REFERRED TO A SPECIALIST, IT MAY TAKE 1-2 WEEKS TO SCHEDULE/PROCESS THE REFERRAL. IF YOU HAVE NOT HEARD FROM US/SPECIALIST IN TWO WEEKS, PLEASE GIVE [place] A CALL AT [phone].

## 2022-10-29 ENCOUNTER — Encounter: Payer: Self-pay | Admitting: Nurse Practitioner

## 2022-10-29 ENCOUNTER — Ambulatory Visit: Payer: PPO | Admitting: Nurse Practitioner

## 2022-10-29 VITALS — BP 120/62 | HR 64 | Temp 98.7°F | Ht 69.0 in | Wt 150.4 lb

## 2022-10-29 DIAGNOSIS — E782 Mixed hyperlipidemia: Secondary | ICD-10-CM

## 2022-10-29 DIAGNOSIS — E785 Hyperlipidemia, unspecified: Secondary | ICD-10-CM | POA: Diagnosis not present

## 2022-10-29 DIAGNOSIS — E1169 Type 2 diabetes mellitus with other specified complication: Secondary | ICD-10-CM

## 2022-10-29 DIAGNOSIS — I10 Essential (primary) hypertension: Secondary | ICD-10-CM | POA: Diagnosis not present

## 2022-10-29 DIAGNOSIS — R002 Palpitations: Secondary | ICD-10-CM | POA: Diagnosis not present

## 2022-10-29 DIAGNOSIS — E11649 Type 2 diabetes mellitus with hypoglycemia without coma: Secondary | ICD-10-CM

## 2022-10-29 MED ORDER — OLMESARTAN-AMLODIPINE-HCTZ 40-10-12.5 MG PO TABS
1.0000 | ORAL_TABLET | Freq: Every day | ORAL | 1 refills | Status: DC
Start: 2022-10-29 — End: 2023-05-02

## 2022-10-29 NOTE — Assessment & Plan Note (Signed)
Intermittent palpitation with standing. I will check labs and he is to cut out drinking caffeinated drinks. Pending labs will consider a referral to cardiology.

## 2022-10-29 NOTE — Assessment & Plan Note (Signed)
HgbA1c is stable, continue current medications. Will see if there are any changes.

## 2022-10-29 NOTE — Assessment & Plan Note (Signed)
Blood pressure improved with recheck, continue current medications.

## 2022-10-30 LAB — LIPID PANEL
Chol/HDL Ratio: 2.1 {ratio} (ref 0.0–5.0)
Cholesterol, Total: 139 mg/dL (ref 100–199)
HDL: 67 mg/dL (ref >39)
LDL Chol Calc (NIH): 59 mg/dL (ref 0–99)
Triglycerides: 62 mg/dL (ref 0–149)
VLDL Cholesterol Cal: 13 mg/dL (ref 5–40)

## 2022-10-30 LAB — BASIC METABOLIC PANEL
BUN/Creatinine Ratio: 14 (ref 10–24)
BUN: 15 mg/dL (ref 8–27)
CO2: 24 mmol/L (ref 20–29)
Calcium: 10.5 mg/dL — ABNORMAL HIGH (ref 8.6–10.2)
Chloride: 103 mmol/L (ref 96–106)
Creatinine, Ser: 1.11 mg/dL (ref 0.76–1.27)
Glucose: 90 mg/dL (ref 70–99)
Potassium: 4.7 mmol/L (ref 3.5–5.2)
Sodium: 140 mmol/L (ref 134–144)
eGFR: 69 mL/min/{1.73_m2} (ref >59)

## 2022-10-30 LAB — CBC
Hematocrit: 36.5 % — ABNORMAL LOW (ref 37.5–51.0)
Hemoglobin: 11.3 g/dL — ABNORMAL LOW (ref 13.0–17.7)
MCH: 26.2 pg — ABNORMAL LOW (ref 26.6–33.0)
MCHC: 31 g/dL — ABNORMAL LOW (ref 31.5–35.7)
MCV: 85 fL (ref 79–97)
Platelets: 306 10*3/uL (ref 150–450)
RBC: 4.31 x10E6/uL (ref 4.14–5.80)
RDW: 16.2 % — ABNORMAL HIGH (ref 11.6–15.4)
WBC: 4.9 10*3/uL (ref 3.4–10.8)

## 2022-10-30 LAB — HEMOGLOBIN A1C
Est. average glucose Bld gHb Est-mCnc: 143 mg/dL
Hgb A1c MFr Bld: 6.6 % — ABNORMAL HIGH (ref 4.8–5.6)

## 2022-11-02 ENCOUNTER — Other Ambulatory Visit: Payer: Self-pay | Admitting: Nurse Practitioner

## 2022-11-02 DIAGNOSIS — I10 Essential (primary) hypertension: Secondary | ICD-10-CM

## 2022-11-21 DIAGNOSIS — N401 Enlarged prostate with lower urinary tract symptoms: Secondary | ICD-10-CM | POA: Diagnosis not present

## 2022-11-28 ENCOUNTER — Other Ambulatory Visit: Payer: Self-pay | Admitting: Nurse Practitioner

## 2022-11-28 DIAGNOSIS — R3914 Feeling of incomplete bladder emptying: Secondary | ICD-10-CM | POA: Diagnosis not present

## 2022-11-28 DIAGNOSIS — N401 Enlarged prostate with lower urinary tract symptoms: Secondary | ICD-10-CM | POA: Diagnosis not present

## 2022-11-28 DIAGNOSIS — E119 Type 2 diabetes mellitus without complications: Secondary | ICD-10-CM

## 2022-11-28 DIAGNOSIS — R972 Elevated prostate specific antigen [PSA]: Secondary | ICD-10-CM | POA: Diagnosis not present

## 2022-12-16 ENCOUNTER — Other Ambulatory Visit: Payer: Self-pay | Admitting: Nurse Practitioner

## 2023-01-02 DIAGNOSIS — E119 Type 2 diabetes mellitus without complications: Secondary | ICD-10-CM | POA: Diagnosis not present

## 2023-01-02 DIAGNOSIS — H2513 Age-related nuclear cataract, bilateral: Secondary | ICD-10-CM | POA: Diagnosis not present

## 2023-01-02 DIAGNOSIS — H35363 Drusen (degenerative) of macula, bilateral: Secondary | ICD-10-CM | POA: Diagnosis not present

## 2023-01-02 DIAGNOSIS — H35033 Hypertensive retinopathy, bilateral: Secondary | ICD-10-CM | POA: Diagnosis not present

## 2023-01-02 LAB — HM DIABETES EYE EXAM

## 2023-01-13 ENCOUNTER — Encounter: Payer: Self-pay | Admitting: Nurse Practitioner

## 2023-02-17 ENCOUNTER — Other Ambulatory Visit: Payer: Self-pay

## 2023-02-17 DIAGNOSIS — K5641 Fecal impaction: Secondary | ICD-10-CM

## 2023-02-17 MED ORDER — LINACLOTIDE 72 MCG PO CAPS
72.0000 ug | ORAL_CAPSULE | Freq: Every day | ORAL | 1 refills | Status: AC
Start: 2023-02-17 — End: ?

## 2023-02-24 ENCOUNTER — Other Ambulatory Visit: Payer: Self-pay

## 2023-02-24 MED ORDER — ONETOUCH ULTRA VI STRP: ORAL_STRIP | 2 refills | Status: DC

## 2023-03-03 ENCOUNTER — Other Ambulatory Visit: Payer: Self-pay

## 2023-03-03 ENCOUNTER — Ambulatory Visit: Payer: PPO | Admitting: Nurse Practitioner

## 2023-03-03 ENCOUNTER — Encounter: Payer: Self-pay | Admitting: Nurse Practitioner

## 2023-03-03 VITALS — BP 130/70 | HR 83 | Temp 98.3°F | Ht 69.0 in | Wt 152.8 lb

## 2023-03-03 DIAGNOSIS — M545 Low back pain, unspecified: Secondary | ICD-10-CM | POA: Diagnosis not present

## 2023-03-03 DIAGNOSIS — E1169 Type 2 diabetes mellitus with other specified complication: Secondary | ICD-10-CM

## 2023-03-03 DIAGNOSIS — E785 Hyperlipidemia, unspecified: Secondary | ICD-10-CM

## 2023-03-03 DIAGNOSIS — I10 Essential (primary) hypertension: Secondary | ICD-10-CM

## 2023-03-03 DIAGNOSIS — Z2821 Immunization not carried out because of patient refusal: Secondary | ICD-10-CM | POA: Diagnosis not present

## 2023-03-03 DIAGNOSIS — G8929 Other chronic pain: Secondary | ICD-10-CM

## 2023-03-03 DIAGNOSIS — E782 Mixed hyperlipidemia: Secondary | ICD-10-CM | POA: Diagnosis not present

## 2023-03-03 MED ORDER — SPIRONOLACTONE 25 MG PO TABS: 12.5000 mg | ORAL_TABLET | Freq: Every day | ORAL | 1 refills | Status: AC

## 2023-03-03 MED ORDER — ONETOUCH ULTRA VI STRP: ORAL_STRIP | 2 refills | Status: DC

## 2023-03-03 MED ORDER — ONETOUCH ULTRA VI STRP
ORAL_STRIP | 2 refills | Status: AC
Start: 2023-03-03 — End: ?

## 2023-03-03 NOTE — Progress Notes (Signed)
 Madelaine Bhat, CMA,acting as a Neurosurgeon for Arnette Felts, FNP.,have documented all relevant documentation on the behalf of Arnette Felts, FNP,as directed by  Arnette Felts, FNP while in the presence of Arnette Felts, FNP.  Subjective:  Patient ID: Ricky Shields , male    DOB: July 02, 1945 , 78 y.o.   MRN: [id number]  Chief Complaint  Patient presents with   Hypertension    HPI  Patient presents today for a bp and dm follow up, Patient reports compliance with medication. Patient denies any chest pain, SOB, or headaches. Patient has no concerns today.   Diabetes He presents for his follow-up diabetic visit. He has type 2 diabetes mellitus. His disease course has been stable. There are no hypoglycemic associated symptoms. Pertinent negatives for hypoglycemia include no dizziness or headaches. Pertinent negatives for diabetes include no blurred vision, no chest pain, no fatigue, no polydipsia, no polyphagia and no polyuria. There are no hypoglycemic complications. Symptoms are stable. (hyperlipidemia) Risk factors for coronary artery disease include male sex and sedentary lifestyle. Current diabetic treatment includes oral agent (dual therapy). He is compliant with treatment all of the time. His weight is stable. He is following a diabetic diet. When asked about meal planning, he reported none. He has not had a previous visit with a dietitian. He participates in exercise intermittently. His home blood glucose trend is decreasing steadily. (He is unable to let me know the blood sugar. ) An ACE inhibitor/angiotensin II receptor blocker is being taken. He does not see a podiatrist.Eye exam is current.  Hypertension This is a chronic problem. The current episode started more than 1 year ago. The problem is unchanged. The problem is controlled. Pertinent negatives include no anxiety, blurred vision, chest pain, headaches or palpitations. There are no associated agents to hypertension. Risk factors for coronary  artery disease include sedentary lifestyle and male gender. Past treatments include angiotensin blockers and diuretics. The current treatment provides no improvement. There are no compliance problems.  There is no history of angina. There is no history of chronic renal disease.    Past Medical History:  Diagnosis Date   Back pain    intermittent-prior military injury   BPH (benign prostatic hypertrophy) with urinary retention    Diabetes mellitus without complication (HCC)    GERD (gastroesophageal reflux disease)    High cholesterol    Hypertension    Tingling of skin 03/20/2018     Family History  Problem Relation Age of Onset   Stroke Mother    Stroke Father      Current Outpatient Medications:    aspirin EC 81 MG tablet, Take 81 mg by mouth daily. Swallow whole., Disp: , Rfl:    atorvastatin (LIPITOR) 40 MG tablet, TAKE 1 TABLET(40 MG) BY MOUTH DAILY, Disp: 90 tablet, Rfl: 1   cholecalciferol (VITAMIN D3) 25 MCG (1000 UNIT) tablet, Take 1,000 Units by mouth daily., Disp: , Rfl:    docusate sodium (COLACE) 100 MG capsule, Take 1 capsule (100 mg total) by mouth 2 (two) times daily., Disp: 30 capsule, Rfl: 0   esomeprazole (NEXIUM) 20 MG capsule, Take 20 mg by mouth daily as needed (acid reflux)., Disp: , Rfl:    JARDIANCE 10 MG TABS tablet, TAKE 1 TABLET(10 MG) BY MOUTH DAILY BEFORE BREAKFAST, Disp: 30 tablet, Rfl: 5   Lidocaine 4 % GEL, Apply 1 Application topically daily as needed (pain)., Disp: , Rfl:    linaclotide (LINZESS) 72 MCG capsule, Take 1 capsule (72 mcg total)  by mouth daily before breakfast., Disp: 90 capsule, Rfl: 1   metFORMIN (GLUCOPHAGE) 1000 MG tablet, TAKE 1 TABLET(1000 MG) BY MOUTH TWICE DAILY WITH A MEAL, Disp: 180 tablet, Rfl: 2   Multiple Vitamin (MULTIVITAMIN WITH MINERALS) TABS tablet, Take 1 tablet by mouth daily. One a Day Men's, Disp: , Rfl:    Multiple Vitamins-Minerals (PRESERVISION AREDS 2) CAPS, Take 1 capsule by mouth 2 (two) times daily., Disp: ,  Rfl:    Olmesartan-amLODIPine-HCTZ 40-10-12.5 MG TABS, Take 1 tablet by mouth daily., Disp: 90 tablet, Rfl: 1   Omega-3 Fatty Acids (FISH OIL) 1200 MG CAPS, Take 1,200 mg by mouth daily., Disp: , Rfl:    polyethylene glycol (MIRALAX) 17 g packet, Take 17 g by mouth daily., Disp: 14 each, Rfl: 0   psyllium (METAMUCIL SMOOTH TEXTURE) 58.6 % powder, Take 1 packet by mouth 3 (three) times daily., Disp: 283 g, Rfl: 12   tamsulosin (FLOMAX) 0.4 MG CAPS capsule, Take 1 capsule (0.4 mg total) by mouth daily., Disp: 30 capsule, Rfl: 0   valACYclovir (VALTREX) 500 MG tablet, Take 500 mg by mouth daily., Disp: , Rfl:    doxycycline (MONODOX) 100 MG capsule, Take 1 capsule (100 mg total) by mouth 2 (two) times daily. (Patient not taking: Reported on 03/03/2023), Disp: 20 capsule, Rfl: 0   glucose blood (ONETOUCH ULTRA) test strip, Use as instructed to check blood sugars twice daily E11.65, Disp: 100 each, Rfl: 2   spironolactone (ALDACTONE) 25 MG tablet, Take 0.5 tablets (12.5 mg total) by mouth daily., Disp: 45 tablet, Rfl: 1   Allergies  Allergen Reactions   Penicillins Rash    Has patient had a PCN reaction causing immediate rash, facial/tongue/throat swelling, SOB or lightheadedness with hypotension: No Has patient had a PCN reaction causing severe rash involving mucus membranes or skin necrosis: No Has patient had a PCN reaction that required hospitalization No Has patient had a PCN reaction occurring within the last 10 years: No If all of the above answers are "NO", then may proceed with Cephalosporin use.      Review of Systems  Constitutional: Negative.  Negative for fatigue.  Eyes:  Negative for blurred vision.  Respiratory: Negative.    Cardiovascular: Negative.  Negative for chest pain and palpitations.  Gastrointestinal: Negative.   Endocrine: Negative for polydipsia, polyphagia and polyuria.  Neurological: Negative.  Negative for dizziness and headaches.  Psychiatric/Behavioral:  Negative.       Today's Vitals   03/03/23 1413  BP: 130/70  Pulse: 83  Temp: 98.3 F (36.8 C)  TempSrc: Oral  Weight: 152 lb 12.8 oz (69.3 kg)  Height: 5\' 9"  (1.753 m)  PainSc: 0-No pain   Body mass index is 22.56 kg/m.  Wt Readings from Last 3 Encounters:  03/03/23 152 lb 12.8 oz (69.3 kg)  10/29/22 150 lb 6.4 oz (68.2 kg)  07/29/22 146 lb 6.4 oz (66.4 kg)      Objective:  Physical Exam Vitals reviewed.  Constitutional:      General: He is not in acute distress.    Appearance: Normal appearance.  Cardiovascular:     Rate and Rhythm: Normal rate and regular rhythm.     Pulses: Normal pulses.     Heart sounds: Normal heart sounds. No murmur heard. Pulmonary:     Effort: Pulmonary effort is normal. No respiratory distress.     Breath sounds: Normal breath sounds. No wheezing.  Musculoskeletal:        General: No swelling. Normal  range of motion.     Right lower leg: No edema.     Left lower leg: No edema.  Skin:    General: Skin is warm and dry.     Capillary Refill: Capillary refill takes less than 2 seconds.     Coloration: Skin is not jaundiced.  Neurological:     General: No focal deficit present.     Mental Status: He is alert and oriented to person, place, and time.     Cranial Nerves: No cranial nerve deficit.     Motor: No weakness.  Psychiatric:        Mood and Affect: Mood normal.        Behavior: Behavior normal.        Thought Content: Thought content normal.        Judgment: Judgment normal.         Assessment And Plan:  Essential hypertension Assessment & Plan: Blood pressure improved with recheck, continue current medications.   Orders: -     BMP8+eGFR -     Spironolactone; Take 0.5 tablets (12.5 mg total) by mouth daily.  Dispense: 45 tablet; Refill: 1  Type 2 diabetes mellitus with hyperlipidemia (HCC) Assessment & Plan: HgbA1c is stable, continue current medications. Will see if there are any changes.  Orders: -     Hemoglobin  A1c  Mixed hyperlipidemia Assessment & Plan: Cholesterol levels are stable. Continue statin, tolerating well.    Chronic bilateral low back pain without sciatica Assessment & Plan: He is having some reoccurring back pain, not as bad as previously. Encouraged to continue stretching and if worsens may need to contact his neurosurgeon.    Influenza vaccination declined Assessment & Plan: Patient declined influenza vaccination at this time. Patient is aware that influenza vaccine prevents illness in 70% of healthy people, and reduces hospitalizations to 30-70% in elderly. This vaccine is recommended annually. Education has been provided regarding the importance of this vaccine but patient still declined. Advised may receive this vaccine at local pharmacy or Health Dept.or vaccine clinic. Aware to provide a copy of the vaccination record if obtained from local pharmacy or Health Dept.  Pt is willing to accept risk associated with refusing vaccination.    COVID-19 vaccination declined Assessment & Plan: Declines covid 19 vaccine. Discussed risk of covid 48 and if he changes her mind about the vaccine to call the office. Education has been provided regarding the importance of this vaccine but patient still declined. Advised may receive this vaccine at local pharmacy or Health Dept.or vaccine clinic. Aware to provide a copy of the vaccination record if obtained from local pharmacy or Health Dept.  Encouraged to take multivitamin, vitamin d, vitamin c and zinc to increase immune system. Aware can call office if would like to have vaccine here at office. Verbalized acceptance and understanding.    Tetanus, diphtheria, and acellular pertussis (Tdap) vaccination declined    Return for keep same next,.  Patient was given opportunity to ask questions. Patient verbalized understanding of the plan and was able to repeat key elements of the plan. All questions were answered to their satisfaction.   Jeanell Sparrow, FNP, have reviewed all documentation for this visit. The documentation on 03/03/23 for the exam, diagnosis, procedures, and orders are all accurate and complete.    IF YOU HAVE BEEN REFERRED TO A SPECIALIST, IT MAY TAKE 1-2 WEEKS TO SCHEDULE/PROCESS THE REFERRAL. IF YOU HAVE NOT HEARD FROM US/SPECIALIST IN TWO WEEKS, PLEASE GIVE  [place] A CALL AT [phone].

## 2023-03-04 LAB — BMP8+EGFR
BUN/Creatinine Ratio: 13 (ref 10–24)
BUN: 16 mg/dL (ref 8–27)
CO2: 23 mmol/L (ref 20–29)
Calcium: 10.3 mg/dL — ABNORMAL HIGH (ref 8.6–10.2)
Chloride: 102 mmol/L (ref 96–106)
Creatinine, Ser: 1.22 mg/dL (ref 0.76–1.27)
Glucose: 157 mg/dL — ABNORMAL HIGH (ref 70–99)
Potassium: 4.5 mmol/L (ref 3.5–5.2)
Sodium: 142 mmol/L (ref 134–144)
eGFR: 61 mL/min/{1.73_m2} (ref >59)

## 2023-03-04 LAB — HEMOGLOBIN A1C
Est. average glucose Bld gHb Est-mCnc: 148 mg/dL
Hgb A1c MFr Bld: 6.8 % — ABNORMAL HIGH (ref 4.8–5.6)

## 2023-03-14 ENCOUNTER — Encounter: Payer: Self-pay | Admitting: Nurse Practitioner

## 2023-03-14 NOTE — Assessment & Plan Note (Signed)
Blood pressure improved with recheck, continue current medications.

## 2023-03-14 NOTE — Assessment & Plan Note (Signed)
Cholesterol levels are stable.  Continue statin, tolerating well.

## 2023-03-14 NOTE — Assessment & Plan Note (Signed)

## 2023-03-14 NOTE — Assessment & Plan Note (Signed)
 He is having some reoccurring back pain, not as bad as previously. Encouraged to continue stretching and if worsens may need to contact his neurosurgeon.

## 2023-03-14 NOTE — Assessment & Plan Note (Signed)
 HgbA1c is stable, continue current medications. Will see if there are any changes.

## 2023-03-14 NOTE — Assessment & Plan Note (Signed)

## 2023-05-02 ENCOUNTER — Other Ambulatory Visit: Payer: Self-pay | Admitting: Nurse Practitioner

## 2023-05-02 DIAGNOSIS — I10 Essential (primary) hypertension: Secondary | ICD-10-CM

## 2023-06-02 ENCOUNTER — Other Ambulatory Visit: Payer: Self-pay | Admitting: Nurse Practitioner

## 2023-06-18 ENCOUNTER — Other Ambulatory Visit: Payer: Self-pay | Admitting: Nurse Practitioner

## 2023-07-30 ENCOUNTER — Ambulatory Visit: Payer: PPO | Admitting: Nurse Practitioner

## 2023-07-30 ENCOUNTER — Encounter: Payer: Self-pay | Admitting: Nurse Practitioner

## 2023-07-30 VITALS — BP 120/60 | HR 85 | Temp 98.7°F | Ht 69.0 in | Wt 150.3 lb

## 2023-07-30 DIAGNOSIS — M545 Low back pain, unspecified: Secondary | ICD-10-CM

## 2023-07-30 DIAGNOSIS — Z0000 Encounter for general adult medical examination without abnormal findings: Secondary | ICD-10-CM

## 2023-07-30 DIAGNOSIS — E1122 Type 2 diabetes mellitus with diabetic chronic kidney disease: Secondary | ICD-10-CM

## 2023-07-30 DIAGNOSIS — E782 Mixed hyperlipidemia: Secondary | ICD-10-CM | POA: Diagnosis not present

## 2023-07-30 DIAGNOSIS — I10 Essential (primary) hypertension: Secondary | ICD-10-CM | POA: Diagnosis not present

## 2023-07-30 DIAGNOSIS — N182 Chronic kidney disease, stage 2 (mild): Secondary | ICD-10-CM | POA: Diagnosis not present

## 2023-07-30 DIAGNOSIS — G8929 Other chronic pain: Secondary | ICD-10-CM | POA: Diagnosis not present

## 2023-07-30 DIAGNOSIS — Z79899 Other long term (current) drug therapy: Secondary | ICD-10-CM

## 2023-07-30 DIAGNOSIS — N390 Urinary tract infection, site not specified: Secondary | ICD-10-CM | POA: Diagnosis not present

## 2023-07-30 LAB — POCT URINALYSIS DIP (CLINITEK)
Bilirubin, UA: NEGATIVE
Blood, UA: NEGATIVE
Glucose, UA: >=1000 mg/dL — AB
Ketones, POC UA: NEGATIVE mg/dL
Leukocytes, UA: NEGATIVE
Nitrite, UA: POSITIVE — AB
POC PROTEIN,UA: NEGATIVE
Spec Grav, UA: 1.01 (ref 1.010–1.025)
Urobilinogen, UA: 0.2 U/dL
pH, UA: 6 (ref 5.0–8.0)

## 2023-07-30 MED ORDER — SULFAMETHOXAZOLE-TRIMETHOPRIM 800-160 MG PO TABS
1.0000 | ORAL_TABLET | Freq: Two times a day (BID) | ORAL | 0 refills | Status: AC
Start: 2023-07-30 — End: ?

## 2023-07-30 NOTE — Progress Notes (Signed)
 Ricky Shields, CMA,acting as a Neurosurgeon for Ricky Ada, FNP.,have documented all relevant documentation on the behalf of Ricky Ada, FNP,as directed by  Ricky Ada, FNP while in the presence of Ricky Ada, FNP.  Subjective:   Patient ID: Ricky Shields , male    DOB: December 17, 1945 , 78 y.o.   MRN: [id number]  Chief Complaint  Patient presents with   Annual Exam    Patient presents today for HM, Patient reports compliance with medication. Patient denies any chest pain, SOB, or headaches. Patient has no concerns today.     HPI Discussed the use of AI scribe software for clinical note transcription with the patient, who gave verbal consent to proceed.  History of Present Illness Ricky Shields is a 78 year old male who presents for an annual physical exam.  He has no new health concerns since his last visit and has not consulted any other healthcare providers. He maintains a regular diet and has been consuming shakes from a new place, which he finds preferable to the previous ones. He has resumed gym activities for over two months, exercising three days a week for two to two and a half hours each session.  He has a history of lower back surgery and has been informed of having mild arthritis. He experiences persistent pain that eases but does not completely resolve. He takes Tylenol  for the arthritis pain, sometimes once or twice a day, but only when he experiences pain. He is concerned about the potential impact of regular Tylenol  use on his kidneys.  He regularly monitors his blood sugar and blood pressure. His blood sugar was 141, and his blood pressure was 127/70. He does not require any medication refills at this time, having recently refilled his medications  He has not experienced any urinary symptoms such as frequency, urgency, or burning. He drinks plenty of water and has been compliant with previous advice to avoid walking barefoot, using bedroom shoes instead. He recalls being on  doxycycline  for a urinary tract infection in the past but has not had any urinary issues since then.  No current health concerns, urinary frequency, urgency, burning, or trouble swallowing. No issues with urination and no problems with swallowing.   Diabetes He presents for his follow-up diabetic visit. He has type 2 diabetes mellitus. His disease course has been stable. There are no hypoglycemic associated symptoms. Pertinent negatives for hypoglycemia include no dizziness or headaches. Pertinent negatives for diabetes include no blurred vision, no chest pain, no fatigue, no polydipsia, no polyphagia and no polyuria. There are no hypoglycemic complications. Symptoms are stable. Diabetic complications include nephropathy. (hyperlipidemia) Risk factors for coronary artery disease include male sex and sedentary lifestyle. Current diabetic treatment includes oral agent (dual therapy). He is compliant with treatment all of the time. His weight is stable. He is following a diabetic diet. When asked about meal planning, he reported none. He has not had a previous visit with a dietitian. He participates in exercise intermittently. His home blood glucose trend is decreasing steadily. (He is unable to let me know the blood sugar. ) An ACE inhibitor/angiotensin II receptor blocker is being taken. He does not see a podiatrist.Eye exam is current.  Hypertension This is a chronic problem. The current episode started more than 1 year ago. The problem has been gradually improving since onset. The problem is controlled. Pertinent negatives include no anxiety, blurred vision, chest pain, headaches or palpitations. There are no associated agents to hypertension. Risk factors  for coronary artery disease include sedentary lifestyle and male gender. Past treatments include angiotensin blockers and diuretics. The current treatment provides no improvement. There are no compliance problems.  Hypertensive end-organ damage includes  kidney disease. There is no history of angina. There is no history of chronic renal disease.     Past Medical History:  Diagnosis Date   Back pain    intermittent-prior military injury   BPH (benign prostatic hypertrophy) with urinary retention    Diabetes mellitus without complication (HCC)    GERD (gastroesophageal reflux disease)    High cholesterol    Hypertension    Tingling of skin 03/20/2018     Family History  Problem Relation Age of Onset   Stroke Mother    Stroke Father      Current Outpatient Medications:    aspirin  EC 81 MG tablet, Take 81 mg by mouth daily. Swallow whole., Disp: , Rfl:    atorvastatin  (LIPITOR) 40 MG tablet, TAKE 1 TABLET(40 MG) BY MOUTH DAILY, Disp: 90 tablet, Rfl: 1   cholecalciferol (VITAMIN D3) 25 MCG (1000 UNIT) tablet, Take 1,000 Units by mouth daily., Disp: , Rfl:    docusate sodium  (COLACE) 100 MG capsule, Take 1 capsule (100 mg total) by mouth 2 (two) times daily., Disp: 30 capsule, Rfl: 0   esomeprazole (NEXIUM) 20 MG capsule, Take 20 mg by mouth daily as needed (acid reflux)., Disp: , Rfl:    glucose blood (ONETOUCH ULTRA) test strip, Use as instructed to check blood sugars twice daily E11.65, Disp: 100 each, Rfl: 2   JARDIANCE  10 MG TABS tablet, TAKE 1 TABLET(10 MG) BY MOUTH DAILY BEFORE BREAKFAST, Disp: 30 tablet, Rfl: 5   Lidocaine  4 % GEL, Apply 1 Application topically daily as needed (pain)., Disp: , Rfl:    linaclotide  (LINZESS ) 72 MCG capsule, Take 1 capsule (72 mcg total) by mouth daily before breakfast., Disp: 90 capsule, Rfl: 1   metFORMIN  (GLUCOPHAGE ) 1000 MG tablet, TAKE 1 TABLET(1000 MG) BY MOUTH TWICE DAILY WITH A MEAL, Disp: 180 tablet, Rfl: 2   Multiple Vitamin (MULTIVITAMIN WITH MINERALS) TABS tablet, Take 1 tablet by mouth daily. One a Day Men's, Disp: , Rfl:    Multiple Vitamins-Minerals (PRESERVISION AREDS 2) CAPS, Take 1 capsule by mouth 2 (two) times daily., Disp: , Rfl:    Olmesartan -amLODIPine -HCTZ 40-10-12.5 MG TABS,  TAKE 1 TABLET BY MOUTH DAILY, Disp: 90 tablet, Rfl: 1   Omega-3 Fatty Acids (FISH OIL) 1200 MG CAPS, Take 1,200 mg by mouth daily., Disp: , Rfl:    polyethylene glycol (MIRALAX ) 17 g packet, Take 17 g by mouth daily., Disp: 14 each, Rfl: 0   psyllium (METAMUCIL SMOOTH TEXTURE) 58.6 % powder, Take 1 packet by mouth 3 (three) times daily., Disp: 283 g, Rfl: 12   spironolactone  (ALDACTONE ) 25 MG tablet, Take 0.5 tablets (12.5 mg total) by mouth daily., Disp: 45 tablet, Rfl: 1   sulfamethoxazole -trimethoprim  (BACTRIM  DS) 800-160 MG tablet, Take 1 tablet by mouth 2 (two) times daily., Disp: 10 tablet, Rfl: 0   tamsulosin  (FLOMAX ) 0.4 MG CAPS capsule, Take 1 capsule (0.4 mg total) by mouth daily., Disp: 30 capsule, Rfl: 0   valACYclovir  (VALTREX ) 500 MG tablet, Take 500 mg by mouth daily., Disp: , Rfl:    ciprofloxacin  (CIPRO ) 250 MG tablet, Take 1 tablet (250 mg total) by mouth 2 (two) times daily for 7 days., Disp: 14 tablet, Rfl: 0   Allergies  Allergen Reactions   Doxycycline  Hives   Penicillins Rash  Has patient had a PCN reaction causing immediate rash, facial/tongue/throat swelling, SOB or lightheadedness with hypotension: No Has patient had a PCN reaction causing severe rash involving mucus membranes or skin necrosis: No Has patient had a PCN reaction that required hospitalization No Has patient had a PCN reaction occurring within the last 10 years: No If all of the above answers are NO, then may proceed with Cephalosporin use.      Men's preventive visit. Patient Health Questionnaire (PHQ-2) is  Flowsheet Row Clinical Support from 08/22/2022 in [facility]  PHQ-2 Total Score 0  Patient is on a Regular diet; occasionally will drink protein shakes. Exercising - he has been back at the gym for 2 months 3 days a week, will stay for about 2-2.5 hours.  Marital status: Married. Relevant history for alcohol use is:  Social History   Substance and Sexual  Activity  Alcohol Use No   Alcohol/week: 0.0 standard drinks of alcohol   Relevant history for tobacco use is:  Social History   Tobacco Use  Smoking Status Never  Smokeless Tobacco Never    Review of Systems  Constitutional:  Negative for fatigue and unexpected weight change.  HENT: Negative.    Eyes: Negative.  Negative for blurred vision.  Respiratory: Negative.    Cardiovascular: Negative.  Negative for chest pain and palpitations.  Gastrointestinal: Negative.   Endocrine: Negative.  Negative for polydipsia, polyphagia and polyuria.  Genitourinary: Negative.   Musculoskeletal: Negative.   Skin: Negative.   Allergic/Immunologic: Negative.   Neurological:  Negative for dizziness and headaches.  Hematological: Negative.   Psychiatric/Behavioral: Negative.       Today's Vitals   07/30/23 1006  BP: 120/60  Pulse: 85  Temp: 98.7 F (37.1 C)  TempSrc: Oral  Weight: 150 lb 5.4 oz (68.2 kg)  Height: 5' 9 (1.753 m)  PainSc: 0-No pain   Body mass index is 22.2 kg/m.  Wt Readings from Last 3 Encounters:  07/30/23 150 lb 5.4 oz (68.2 kg)  03/03/23 152 lb 12.8 oz (69.3 kg)  10/29/22 150 lb 6.4 oz (68.2 kg)    Objective:  Physical Exam Vitals and nursing note reviewed.  Constitutional:      General: He is not in acute distress.    Appearance: Normal appearance.  HENT:     Head: Normocephalic and atraumatic.     Right Ear: Tympanic membrane, ear canal and external ear normal. There is no impacted cerumen.     Left Ear: Tympanic membrane, ear canal and external ear normal. There is no impacted cerumen.     Nose: Nose normal.     Mouth/Throat:     Mouth: Mucous membranes are moist.  Cardiovascular:     Rate and Rhythm: Normal rate and regular rhythm.     Pulses: Normal pulses.     Heart sounds: Normal heart sounds. No murmur heard. Pulmonary:     Effort: Pulmonary effort is normal. No respiratory distress.     Breath sounds: Normal breath sounds. No wheezing.   Abdominal:     General: Abdomen is flat. Bowel sounds are normal. There is no distension.     Palpations: Abdomen is soft.     Tenderness: There is no abdominal tenderness.  Genitourinary:    Comments: Deferred - sees Urologist Musculoskeletal:        General: Normal range of motion.     Cervical back: Normal range of motion and neck supple.  Skin:  General: Skin is warm.     Capillary Refill: Capillary refill takes less than 2 seconds.  Neurological:     General: No focal deficit present.     Mental Status: He is alert and oriented to person, place, and time.     Cranial Nerves: No cranial nerve deficit.     Motor: No weakness.  Psychiatric:        Mood and Affect: Mood normal.        Behavior: Behavior normal.        Thought Content: Thought content normal.        Judgment: Judgment normal.      Diabetic foot exam was performed with the following findings:   No deformities, ulcerations, or other skin breakdown Normal sensation of 10g monofilament Intact posterior tibialis and dorsalis pedis pulses Decreased sensation      Assessment And Plan:    Encounter for annual health examination Assessment & Plan: Behavior modifications discussed and diet history reviewed.   Pt will continue to exercise regularly and modify diet with low GI, plant based foods and decrease intake of processed foods.  Recommend intake of daily multivitamin, Vitamin D, and calcium .  Recommend colonoscopy for preventive screenings, as well as recommend immunizations that include influenza, TDAP, and Shingles    Essential hypertension Assessment & Plan: Blood pressure is well controlled, continue current medications.   Orders: -     EKG 12-Lead -     POCT URINALYSIS DIP (CLINITEK) -     Microalbumin / creatinine urine ratio -     CMP14+EGFR  Type 2 diabetes mellitus with stage 2 chronic kidney disease, without long-term current use of insulin  (HCC) Assessment & Plan: HgbA1c is stable,  continue current medications. Will see if there are any changes. Diabetic foot exam done.   Orders: -     Hemoglobin A1c -     Lipid panel  Mixed hyperlipidemia Assessment & Plan: Cholesterol levels are stable. Continue statin, tolerating well.    Urinary tract infection without hematuria, site unspecified Assessment & Plan: Positive nitrates UTI confirmed. Previous doxycycline  treatment. - Prescribed Bactrim  1 tablet twice daily for 5 days. - Urine culture to identify bacteria and adjust treatment if necessary.  Orders: -     Urine Culture -     Sulfamethoxazole -Trimethoprim ; Take 1 tablet by mouth 2 (two) times daily.  Dispense: 10 tablet; Refill: 0  Other long term (current) drug therapy -     CBC with Differential/Platelet  Chronic bilateral low back pain without sciatica Assessment & Plan: Occasionally will have back pain and unsure of taking acetaminophen . Advised can take up to 3000 mg acetaminophen  per day    Return for 1 year physical, controlled DM check 4 months. Patient was given opportunity to ask questions. Patient verbalized understanding of the plan and was able to repeat key elements of the plan. All questions were answered to their satisfaction.   Ricky Ada, FNP  I, Ricky Ada, FNP, have reviewed all documentation for this visit. The documentation on 07/30/23 for the exam, diagnosis, procedures, and orders are all accurate and complete.

## 2023-07-31 LAB — CBC WITH DIFFERENTIAL/PLATELET
Basophils Absolute: 0 x10E3/uL (ref 0.0–0.2)
Basos: 1 %
EOS (ABSOLUTE): 0.1 x10E3/uL (ref 0.0–0.4)
Eos: 3 %
Hematocrit: 38.6 % (ref 37.5–51.0)
Hemoglobin: 12.8 g/dL — ABNORMAL LOW (ref 13.0–17.7)
Immature Grans (Abs): 0 x10E3/uL (ref 0.0–0.1)
Immature Granulocytes: 0 %
Lymphocytes Absolute: 1.9 x10E3/uL (ref 0.7–3.1)
Lymphs: 34 %
MCH: 31.5 pg (ref 26.6–33.0)
MCHC: 33.2 g/dL (ref 31.5–35.7)
MCV: 95 fL (ref 79–97)
Monocytes Absolute: 0.5 x10E3/uL (ref 0.1–0.9)
Monocytes: 9 %
Neutrophils Absolute: 3 x10E3/uL (ref 1.4–7.0)
Neutrophils: 53 %
Platelets: 246 x10E3/uL (ref 150–450)
RBC: 4.06 x10E6/uL — ABNORMAL LOW (ref 4.14–5.80)
RDW: 13.4 % (ref 11.6–15.4)
WBC: 5.5 x10E3/uL (ref 3.4–10.8)

## 2023-07-31 LAB — CMP14+EGFR
ALT: 21 IU/L (ref 0–44)
AST: 27 IU/L (ref 0–40)
Albumin: 4.5 g/dL (ref 3.8–4.8)
Alkaline Phosphatase: 90 IU/L (ref 44–121)
BUN/Creatinine Ratio: 13 (ref 10–24)
BUN: 16 mg/dL (ref 8–27)
Bilirubin Total: 0.7 mg/dL (ref 0.0–1.2)
CO2: 20 mmol/L (ref 20–29)
Calcium: 10 mg/dL (ref 8.6–10.2)
Chloride: 100 mmol/L (ref 96–106)
Creatinine, Ser: 1.24 mg/dL (ref 0.76–1.27)
Globulin, Total: 2.6 g/dL (ref 1.5–4.5)
Glucose: 107 mg/dL — ABNORMAL HIGH (ref 70–99)
Potassium: 4.1 mmol/L (ref 3.5–5.2)
Sodium: 139 mmol/L (ref 134–144)
Total Protein: 7.1 g/dL (ref 6.0–8.5)
eGFR: 60 mL/min/1.73 (ref >59)

## 2023-07-31 LAB — MICROALBUMIN / CREATININE URINE RATIO
Creatinine, Urine: 71.8 mg/dL
Microalb/Creat Ratio: 27 mg/g{creat} (ref 0–29)
Microalbumin, Urine: 19.1 ug/mL

## 2023-07-31 LAB — HEMOGLOBIN A1C
Est. average glucose Bld gHb Est-mCnc: 146 mg/dL
Hgb A1c MFr Bld: 6.7 % — ABNORMAL HIGH (ref 4.8–5.6)

## 2023-07-31 LAB — LIPID PANEL
Chol/HDL Ratio: 2.5 ratio (ref 0.0–5.0)
Cholesterol, Total: 159 mg/dL (ref 100–199)
HDL: 63 mg/dL (ref >39)
LDL Chol Calc (NIH): 85 mg/dL (ref 0–99)
Triglycerides: 51 mg/dL (ref 0–149)
VLDL Cholesterol Cal: 11 mg/dL (ref 5–40)

## 2023-08-06 ENCOUNTER — Ambulatory Visit: Payer: Self-pay | Admitting: Nurse Practitioner

## 2023-08-06 DIAGNOSIS — N390 Urinary tract infection, site not specified: Secondary | ICD-10-CM

## 2023-08-06 LAB — URINE CULTURE

## 2023-08-06 MED ORDER — CIPROFLOXACIN HCL 250 MG PO TABS: 250.0000 mg | ORAL_TABLET | Freq: Two times a day (BID) | ORAL | 0 refills | Status: AC

## 2023-08-07 DIAGNOSIS — N390 Urinary tract infection, site not specified: Secondary | ICD-10-CM | POA: Insufficient documentation

## 2023-08-07 NOTE — Assessment & Plan Note (Signed)
 Behavior modifications discussed and diet history reviewed.   Pt will continue to exercise regularly and modify diet with low GI, plant based foods and decrease intake of processed foods.  Recommend intake of daily multivitamin, Vitamin D, and calcium.  Recommend colonoscopy for preventive screenings, as well as recommend immunizations that include influenza, TDAP, and Shingles

## 2023-08-07 NOTE — Assessment & Plan Note (Signed)
Cholesterol levels are stable.  Continue statin, tolerating well.

## 2023-08-07 NOTE — Assessment & Plan Note (Signed)
 Blood pressure is well controlled, continue current medications.

## 2023-08-07 NOTE — Assessment & Plan Note (Addendum)
 Positive nitrates UTI confirmed. Previous doxycycline  treatment. - Prescribed Bactrim  1 tablet twice daily for 5 days. - Urine culture to identify bacteria and adjust treatment if necessary.

## 2023-08-07 NOTE — Assessment & Plan Note (Addendum)
 HgbA1c is stable, continue current medications. Will see if there are any changes. Diabetic foot exam done.

## 2023-08-07 NOTE — Assessment & Plan Note (Signed)
 Occasionally will have back pain and unsure of taking acetaminophen . Advised can take up to 3000 mg acetaminophen  per day

## 2023-08-25 ENCOUNTER — Ambulatory Visit (INDEPENDENT_AMBULATORY_CARE_PROVIDER_SITE_OTHER): Payer: PPO | Admitting: Nurse Practitioner

## 2023-08-25 ENCOUNTER — Encounter: Payer: Self-pay | Admitting: Nurse Practitioner

## 2023-08-25 VITALS — BP 114/80 | HR 90 | Temp 99.0°F | Ht 69.0 in | Wt 153.0 lb

## 2023-08-25 DIAGNOSIS — Z79899 Other long term (current) drug therapy: Secondary | ICD-10-CM

## 2023-08-25 DIAGNOSIS — Z0000 Encounter for general adult medical examination without abnormal findings: Secondary | ICD-10-CM | POA: Insufficient documentation

## 2023-08-25 DIAGNOSIS — Z87440 Personal history of urinary (tract) infections: Secondary | ICD-10-CM | POA: Insufficient documentation

## 2023-08-25 NOTE — Patient Instructions (Signed)
 Mr. Wehrman , Thank you for taking time to come for your Medicare Wellness Visit. I appreciate your ongoing commitment to your health goals. Please review the following plan we discussed and let me know if I can assist you in the future.   These are the goals we discussed:  Goals      Monitor and Manage My Blood Sugar-Diabetes Type 2     Timeframe:  Long-Range Goal Priority:  High                   Follow Up Date 02/20/2022  In Progress:   - check blood sugar at prescribed times - check blood sugar if I feel it is too high or too low - take the blood sugar log to all doctor visits    Why is this important?   Checking your blood sugar at home helps to keep it from getting very high or very low.  Writing the results in a diary or log helps the doctor know how to care for you.  Your blood sugar log should have the time, date and the results.  Also, write down the amount of insulin  or other medicine that you take.  Other information, like what you ate, exercise done and how you were feeling, will also be helpful.          Patient Stated     Wants get get rid of diabetes     Patient Stated     07/08/2019, wants to stay healthy eat more fruits and vegetables     Patient Stated     07/13/2020, wants to start walking outside again     Patient Stated     08/09/2021, wants to get through surgery     Patient Stated     08/22/2022, trying to get better     Patient Stated     I just want to get better than I was.      Pharmacy Care Plan     CARE PLAN ENTRY (see longitudinal plan of care for additional care plan information)  Current Barriers:  Chronic Disease Management support, education, and care coordination needs related to Hypertension, Hyperlipidemia, and Diabetes   Hypertension BP Readings from Last 3 Encounters:  10/14/19 140/78  07/08/19 128/80  07/08/19 128/80   Pharmacist Clinical Goal(s): Over the next 90 days, patient will work with PharmD and providers to  maintain BP goal <130/80 Current regimen:  Olmesartan /Amlodipine /HCTZ 40/10/12.5mg  daily Interventions: Provided dietary and exercise recommendations Discussed appropriate goal for blood pressure (less than 130/80) Patient complained of swelling Recommended patient elevate legs Consider compression stockings Could consider increasing diuretic if patient is retaining fluid Advised patient to discuss with PCP at office visit next week Patient self care activities - Over the next 90 days, patient will: Check BP once-twice weekly, document, and provide at future appointments Ensure daily salt intake < 2300 mg/day Elevate legs to help with swelling and consider using compression stockings Discuss swelling with PCP at office visit next week Exercise for 30 minutes daily 5 times per week (150 minutes total)  Hyperlipidemia Lab Results  Component Value Date/Time   LDLCALC 75 07/08/2019 03:38 PM   Pharmacist Clinical Goal(s): Over the next 90 days, patient will work with PharmD and providers to achieve LDL goal < 70 Current regimen:  Simvastatin  40mg  daily every evening Interventions: Provided dietary and exercise recommendations Discussed appropriate goals for LDL (less than 70), HDL (greater than 40), and triglycerides (less than 150) Patient  self care activities - Over the next 90 days, patient will: Exercise for 30 minutes daily 5 times per week (150 minutes total) Taking medication every evening on a schedule  Diabetes Lab Results  Component Value Date/Time   HGBA1C 6.9 (H) 07/08/2019 02:07 PM   HGBA1C 6.8 (H) 12/30/2018 02:17 PM   Pharmacist Clinical Goal(s): Over the next 180 days, patient will work with PharmD and providers to maintain A1c goal <7% Current regimen:  Metformin  500mg  2 tablets twice daily with morning and evening meal Interventions: Provided dietary and exercise recommendations Discussed appropriate goal for fasting blood sugar (80-130) Discussed  hypoglycemia treatment Patient self care activities - Over the next 180 days, patient will: Check blood sugar twice weekly, document, and provide at future appointments Contact provider with any episodes of hypoglycemia Exercise for 30 minutes daily 5 times per week (150 minutes total)  Medication management Pharmacist Clinical Goal(s): Over the next 90 days, patient will work with PharmD and providers to maintain optimal medication adherence Current pharmacy: [name] Interventions Comprehensive medication review performed. Continue current medication management strategy Patient self care activities - Over the next 90 days, patient will: Focus on medication adherence by utilizing pill box Take medications as prescribed Report any questions or concerns to PharmD and/or provider(s)  Initial goal documentation         This is a list of the screening recommended for you and due dates:  Health Maintenance  Topic Date Due   COVID-19 Vaccine (8 - 2024-25 season) 09/15/2022   Flu Shot  08/15/2023   DTaP/Tdap/Td vaccine (2 - Td or Tdap) 03/02/2024*   Eye exam for diabetics  01/02/2024   Hemoglobin A1C  01/30/2024   Yearly kidney function blood test for diabetes  07/29/2024   Yearly kidney health urinalysis for diabetes  07/29/2024   Complete foot exam   07/29/2024   Medicare Annual Wellness Visit  08/24/2024   Pneumococcal Vaccine for age over 108  Completed   Hepatitis C Screening  Completed   Zoster (Shingles) Vaccine  Completed   Hepatitis B Vaccine  Aged Out   HPV Vaccine  Aged Out   Meningitis B Vaccine  Aged Out   Colon Cancer Screening  Discontinued  *Topic was postponed. The date shown is not the original due date.

## 2023-08-25 NOTE — Progress Notes (Signed)
 Subjective:   Ricky Shields is a 78 y.o. male who presents for Medicare Annual/Subsequent preventive examination.  Visit Complete: In person  Patient Medicare AWV questionnaire was completed by the patient on 08/25/23; I have confirmed that all information answered by patient is correct and no changes since this date.  Reports he had a reaction to the bactrim  and when we started the cipro  for his uti and he took benadryl the red spots went away.      Objective:    Today's Vitals   08/25/23 0857  BP: 114/80  Pulse: 90  Temp: 99 F (37.2 C)  TempSrc: Oral  Weight: 153 lb (69.4 kg)  Height: 5' 9 (1.753 m)  PainSc: 0-No pain   Body mass index is 22.59 kg/m.     08/25/2023    9:27 AM 08/22/2022   10:51 AM 03/27/2022   11:34 PM 08/16/2021   11:28 AM 08/09/2021    2:33 PM 08/09/2021   10:38 AM 07/13/2020   11:05 AM  Advanced Directives  Does Patient Have a Medical Advance Directive? No No No No No No No  Would patient like information on creating a medical advance directive? Yes (ED - Information included in AVS)  No - Patient declined No - Patient declined No - Patient declined No - Patient declined     Current Medications (verified) Outpatient Encounter Medications as of 08/25/2023  Medication Sig   aspirin  EC 81 MG tablet Take 81 mg by mouth daily. Swallow whole.   atorvastatin  (LIPITOR) 40 MG tablet TAKE 1 TABLET(40 MG) BY MOUTH DAILY   cholecalciferol (VITAMIN D3) 25 MCG (1000 UNIT) tablet Take 1,000 Units by mouth daily.   docusate sodium  (COLACE) 100 MG capsule Take 1 capsule (100 mg total) by mouth 2 (two) times daily.   esomeprazole (NEXIUM) 20 MG capsule Take 20 mg by mouth daily as needed (acid reflux).   glucose blood (ONETOUCH ULTRA) test strip Use as instructed to check blood sugars twice daily E11.65   JARDIANCE  10 MG TABS tablet TAKE 1 TABLET(10 MG) BY MOUTH DAILY BEFORE BREAKFAST   Lidocaine  4 % GEL Apply 1 Application topically daily as needed (pain).    linaclotide  (LINZESS ) 72 MCG capsule Take 1 capsule (72 mcg total) by mouth daily before breakfast.   metFORMIN  (GLUCOPHAGE ) 1000 MG tablet TAKE 1 TABLET(1000 MG) BY MOUTH TWICE DAILY WITH A MEAL   Multiple Vitamin (MULTIVITAMIN WITH MINERALS) TABS tablet Take 1 tablet by mouth daily. One a Day Men's   Multiple Vitamins-Minerals (PRESERVISION AREDS 2) CAPS Take 1 capsule by mouth 2 (two) times daily.   Olmesartan -amLODIPine -HCTZ 40-10-12.5 MG TABS TAKE 1 TABLET BY MOUTH DAILY   Omega-3 Fatty Acids (FISH OIL) 1200 MG CAPS Take 1,200 mg by mouth daily.   polyethylene glycol (MIRALAX ) 17 g packet Take 17 g by mouth daily.   psyllium (METAMUCIL SMOOTH TEXTURE) 58.6 % powder Take 1 packet by mouth 3 (three) times daily.   spironolactone  (ALDACTONE ) 25 MG tablet Take 0.5 tablets (12.5 mg total) by mouth daily.   sulfamethoxazole -trimethoprim  (BACTRIM  DS) 800-160 MG tablet Take 1 tablet by mouth 2 (two) times daily.   tamsulosin  (FLOMAX ) 0.4 MG CAPS capsule Take 1 capsule (0.4 mg total) by mouth daily.   valACYclovir  (VALTREX ) 500 MG tablet Take 500 mg by mouth daily.   No facility-administered encounter medications on file as of 08/25/2023.    Allergies (verified) Bactrim  [sulfamethoxazole -trimethoprim ], Doxycycline , and Penicillins   History: Past Medical History:  Diagnosis Date  Back pain    intermittent-prior military injury   BPH (benign prostatic hypertrophy) with urinary retention    Diabetes mellitus without complication (HCC)    GERD (gastroesophageal reflux disease)    High cholesterol    Hypertension    Tingling of skin 03/20/2018   Past Surgical History:  Procedure Laterality Date   APPENDECTOMY     COLONOSCOPY     GREEN LIGHT LASER TURP (TRANSURETHRAL RESECTION OF PROSTATE N/A 10/21/2014   Procedure: GREEN LIGHT LASER TURP (TRANSURETHRAL RESECTION OF PROSTATE;  Surgeon: Donnice Brooks, MD;  Location: [facility];  Service: Urology;  Laterality: N/A;    LUMBAR LAMINECTOMY/DECOMPRESSION MICRODISCECTOMY Bilateral 08/16/2021   Procedure: BILATERAL LUMBAR THREE-FOUR, LUMBAR FOUR-FIVE LAMINECTOMY LEAR; POSS DISKECTOMY;  Surgeon: Mavis Purchase, MD;  Location: [facility] OR;  Service: Neurosurgery;  Laterality: Bilateral;   Family History  Problem Relation Age of Onset   Stroke Mother    Stroke Father    Social History   Socioeconomic History   Marital status: Married    Spouse name: Not on file   Number of children: Not on file   Years of education: Not on file   Highest education level: Not on file  Occupational History   Occupation: retired  Tobacco Use   Smoking status: Never   Smokeless tobacco: Never  Vaping Use   Vaping status: Never Used  Substance and Sexual Activity   Alcohol use: No    Alcohol/week: 0.0 standard drinks of alcohol   Drug use: No   Sexual activity: Yes  Other Topics Concern   Not on file  Social History Narrative   Not on file   Social Drivers of Health   Financial Resource Strain: Low Risk  (08/25/2023)   Overall Financial Resource Strain (CARDIA)    Difficulty of Paying Living Expenses: Not hard at all  Food Insecurity: No Food Insecurity (08/25/2023)   Hunger Vital Sign    Worried About Running Out of Food in the Last Year: Never true    Ran Out of Food in the Last Year: Never true  Transportation Needs: No Transportation Needs (08/25/2023)   PRAPARE - Administrator, Civil Service (Medical): No    Lack of Transportation (Non-Medical): No  Physical Activity: Sufficiently Active (08/25/2023)   Exercise Vital Sign    Days of Exercise per Week: 3 days    Minutes of Exercise per Session: 90 min  Stress: No Stress Concern Present (08/25/2023)   [employer] of Occupational Health - Occupational Stress Questionnaire    Feeling of Stress: Not at all  Social Connections: Socially Integrated (08/25/2023)   Social Connection and Isolation Panel    Frequency of Communication with Friends  and Family: Three times a week    Frequency of Social Gatherings with Friends and Family: Twice a week    Attends Religious Services: 1 to 4 times per year    Active Member of [employer] or Organizations: Yes    Attends Banker Meetings: 1 to 4 times per year    Marital Status: Married    Tobacco Counseling Counseling given: Not Answered   Clinical Intake:   Pain Score: 0-No pain   Activities of Daily Living    08/25/2023    8:59 AM  In your present state of health, do you have any difficulty performing the following activities:  Hearing? 0  Vision? 0  Difficulty concentrating or making decisions? 0  Walking or climbing stairs? 0  Dressing or bathing?  0  Doing errands, shopping? 0    Patient Care Team: Georgina Speaks, FNP as PCP - General (General Practice) Pearson, Vallie J, [facility] (Inactive) (Pharmacist) Cyrus Carwin, MD as Consulting Physician (Ophthalmology)  Indicate any recent Medical Services you may have received from other than [facility] providers in the past year (date may be approximate).     Assessment:   This is a routine wellness examination for Mackinley.  Hearing/Vision screen Hearing Screening   500Hz  1000Hz  2000Hz  4000Hz   Right ear 25 25 25 25   Left ear 40 40 40 40   Vision Screening   Right eye Left eye Both eyes  Without correction 20/20 20/20 20/20   With correction        Goals Addressed             This Visit's Progress    Patient Stated       I just want to get better than I was.        Depression Screen    08/25/2023    8:58 AM 08/22/2022   10:53 AM 07/29/2022   10:00 AM 05/31/2022    9:57 AM 03/20/2022   11:10 AM 11/19/2021   10:51 AM 08/09/2021    2:34 PM  PHQ 2/9 Scores  PHQ - 2 Score 0 0 0 0 0 0 0  PHQ- 9 Score 0 0 0 0       Fall Risk    08/25/2023    8:58 AM 08/22/2022   10:52 AM 07/29/2022   10:00 AM 05/31/2022    9:57 AM 03/20/2022   11:09 AM  Fall Risk   Falls in the past year? 0 0 0 0 0  Number falls in past yr: 0  0 0 0   Injury with Fall? 0 0 0 0   Risk for fall due to : No Fall Risks Medication side effect No Fall Risks No Fall Risks   Follow up Falls evaluation completed Falls prevention discussed;Falls evaluation completed Falls evaluation completed;Education provided Falls evaluation completed     MEDICARE RISK AT HOME:    TIMED UP AND GO:  Was the test performed?  Yes  Length of time to ambulate 10 feet: 30 sec Gait steady and fast without use of assistive device    Cognitive Function:        08/25/2023    9:33 AM 08/22/2022   10:53 AM 08/09/2021    2:35 PM 07/13/2020   11:07 AM 07/08/2019   11:37 AM  6CIT Screen  What Year? 0 points 0 points 0 points 0 points 0 points  What month? 0 points 0 points 0 points 0 points 0 points  What time? 3 points 0 points 0 points 0 points 0 points  Count back from 20 0 points 0 points 0 points 0 points 0 points  Months in reverse 4 points 4 points 2 points 4 points 0 points  Repeat phrase 6 points 2 points 6 points 6 points 4 points  Total Score 13 points 6 points 8 points 10 points 4 points    Immunizations Immunization History  Administered Date(s) Administered   Fluad Quad(high Dose 65+) 09/30/2018, 10/19/2019   Influenza, High Dose Seasonal PF 10/10/2017, 10/05/2021   Influenza-Unspecified 11/10/2017, 10/26/2020   PFIZER Comirnaty(Gray Top)Covid-19 Tri-Sucrose Vaccine 10/05/2021   PFIZER(Purple Top)SARS-COV-2 Vaccination 02/19/2019, 03/12/2019, 11/13/2019, 12/14/2019, 10/26/2020   Pfizer Covid-19 Vaccine Bivalent Booster 24yrs & up 07/11/2021   Pneumococcal Conjugate-13 09/30/2018   Pneumococcal Polysaccharide-23 10/17/2015   Pneumococcal-Unspecified 11/02/2012,  02/09/2014   Tdap 11/02/2012   Zoster Recombinant(Shingrix ) 11/22/2020, 01/25/2021    TDAP status: Up to date  Flu Vaccine status: Due, Education has been provided regarding the importance of this vaccine. Advised may receive this vaccine at local pharmacy or Health Dept. Aware  to provide a copy of the vaccination record if obtained from local pharmacy or Health Dept. Verbalized acceptance and understanding.  Pneumococcal vaccine status: Up to date  Covid-19 vaccine status: Information provided on how to obtain vaccines.   Qualifies for Shingles Vaccine? Yes   Zostavax completed Yes   Shingrix  Completed?: Yes  Screening Tests Health Maintenance  Topic Date Due   COVID-19 Vaccine (8 - 2024-25 season) 09/15/2022   INFLUENZA VACCINE  08/15/2023   DTaP/Tdap/Td (2 - Td or Tdap) 03/02/2024 (Originally 11/03/2022)   OPHTHALMOLOGY EXAM  01/02/2024   HEMOGLOBIN A1C  01/30/2024   Diabetic kidney evaluation - eGFR measurement  07/29/2024   Diabetic kidney evaluation - Urine ACR  07/29/2024   FOOT EXAM  07/29/2024   Medicare Annual Wellness (AWV)  08/24/2024   Pneumococcal Vaccine: 50+ Years  Completed   Hepatitis C Screening  Completed   Zoster Vaccines- Shingrix   Completed   Hepatitis B Vaccines  Aged Out   HPV VACCINES  Aged Out   Meningococcal B Vaccine  Aged Out   Colonoscopy  Discontinued    Health Maintenance  Health Maintenance Due  Topic Date Due   COVID-19 Vaccine (8 - 2024-25 season) 09/15/2022   INFLUENZA VACCINE  08/15/2023    Colorectal cancer screening: No longer required.   Lung Cancer Screening: (Low Dose CT Chest recommended if Age 8-80 years, 20 pack-year currently smoking OR have quit w/in 15years.) does not qualify.   Lung Cancer Screening Referral: no  Additional Screening:  Hepatitis C Screening: does qualify; Completed 06/12/2016  Vision Screening: Recommended annual ophthalmology exams for early detection of glaucoma and other disorders of the eye. Is the patient up to date with their annual eye exam?  Yes  Who is the provider or what is the name of the office in which the patient attends annual eye exams? Dr.Whittaker If pt is not established with a provider, would they like to be referred to a provider to establish care?  No .   Dental Screening: Recommended annual dental exams for proper oral hygiene  Diabetic Foot Exam: Diabetic Foot Exam: Completed 07/29/2024  Community Resource Referral / Chronic Care Management: CRR required this visit?  No   CCM required this visit?  No     Plan:   Problem List Items Addressed This Visit       Genitourinary   History of UTI   He was recently treated for UTI with bactrim  first and had hives, medication changed to cipro  and reports he is doing well. Will recheck urine culture today      Relevant Orders   Culture, Urine     Other   Encounter for Medicare annual wellness exam - Primary   Pt's annual wellness exam was performed and geriatric assessment reviewed.  Pt has no new identiafble wellness concerns at this time.  WIll obtain routine labs.  Will obtain UA and micro.  Behavior modifications discussed and diet history reviewed. Pt will continue to exercise regularly and modify diet, with low GI, plant based foods and decrease food intake of processed foods.  Recommend intake of daily multivitamin, Vitamin D, and calcium . Recommond for preventive screenings, as well as recommend immunizations that include influenza (up  to date) and TDAP       Other Visit Diagnoses       Other long term (current) drug therapy           I have personally reviewed and noted the following in the patient's chart:   Medical and social history Use of alcohol, tobacco or illicit drugs  Current medications and supplements including opioid prescriptions. Patient is not currently taking opioid prescriptions. Functional ability and status Nutritional status Physical activity Advanced directives List of other physicians Hospitalizations, surgeries, and ER visits in previous 12 months Vitals Screenings to include cognitive, depression, and falls Referrals and appointments  In addition, I have reviewed and discussed with patient certain preventive protocols, quality  metrics, and best practice recommendations. A written personalized care plan for preventive services as well as general preventive health recommendations were provided to patient.     Gaines Ada, FNP   08/25/2023   After Visit Summary: (In Person-Printed) AVS printed and given to the patient  Nurse Notes:

## 2023-08-25 NOTE — Assessment & Plan Note (Signed)
 He was recently treated for UTI with bactrim  first and had hives, medication changed to cipro  and reports he is doing well. Will recheck urine culture today

## 2023-08-25 NOTE — Assessment & Plan Note (Signed)
 Pt's annual wellness exam was performed and geriatric assessment reviewed.  Pt has no new identiafble wellness concerns at this time.  WIll obtain routine labs.  Will obtain UA and micro.  Behavior modifications discussed and diet history reviewed. Pt will continue to exercise regularly and modify diet, with low GI, plant based foods and decrease food intake of processed foods.  Recommend intake of daily multivitamin, Vitamin D, and calcium. Recommond for preventive screenings, as well as recommend immunizations that include influenza (up to date) and TDAP

## 2023-08-28 LAB — URINE CULTURE

## 2023-08-31 ENCOUNTER — Other Ambulatory Visit: Payer: Self-pay | Admitting: Nurse Practitioner

## 2023-08-31 ENCOUNTER — Ambulatory Visit: Payer: Self-pay | Admitting: Nurse Practitioner

## 2023-08-31 DIAGNOSIS — N390 Urinary tract infection, site not specified: Secondary | ICD-10-CM

## 2023-08-31 MED ORDER — CIPROFLOXACIN HCL 500 MG PO TABS: 500.0000 mg | ORAL_TABLET | Freq: Two times a day (BID) | ORAL | 0 refills | Status: AC

## 2023-09-10 ENCOUNTER — Other Ambulatory Visit: Payer: Self-pay

## 2023-09-10 ENCOUNTER — Telehealth: Payer: Self-pay

## 2023-09-10 DIAGNOSIS — E119 Type 2 diabetes mellitus without complications: Secondary | ICD-10-CM

## 2023-09-10 MED ORDER — EMPAGLIFLOZIN 10 MG PO TABS: 10.0000 mg | ORAL_TABLET | Freq: Every day | ORAL | 2 refills | Status: DC

## 2023-09-10 NOTE — Telephone Encounter (Signed)
RX benefits verified.

## 2023-10-27 ENCOUNTER — Other Ambulatory Visit: Payer: Self-pay | Admitting: Nurse Practitioner

## 2023-10-27 DIAGNOSIS — I10 Essential (primary) hypertension: Secondary | ICD-10-CM

## 2023-11-17 DIAGNOSIS — R972 Elevated prostate specific antigen [PSA]: Secondary | ICD-10-CM | POA: Diagnosis not present

## 2023-11-24 DIAGNOSIS — N401 Enlarged prostate with lower urinary tract symptoms: Secondary | ICD-10-CM | POA: Diagnosis not present

## 2023-11-24 DIAGNOSIS — D3500 Benign neoplasm of unspecified adrenal gland: Secondary | ICD-10-CM | POA: Diagnosis not present

## 2023-11-24 DIAGNOSIS — R3914 Feeling of incomplete bladder emptying: Secondary | ICD-10-CM | POA: Diagnosis not present

## 2023-11-24 DIAGNOSIS — R399 Unspecified symptoms and signs involving the genitourinary system: Secondary | ICD-10-CM | POA: Diagnosis not present

## 2023-12-02 ENCOUNTER — Encounter: Payer: Self-pay | Admitting: Nurse Practitioner

## 2023-12-02 ENCOUNTER — Ambulatory Visit: Payer: Self-pay | Admitting: Nurse Practitioner

## 2023-12-02 VITALS — BP 110/66 | HR 77 | Temp 98.1°F | Ht 69.0 in | Wt 149.1 lb

## 2023-12-02 DIAGNOSIS — E785 Hyperlipidemia, unspecified: Secondary | ICD-10-CM

## 2023-12-02 DIAGNOSIS — E1169 Type 2 diabetes mellitus with other specified complication: Secondary | ICD-10-CM | POA: Diagnosis not present

## 2023-12-02 DIAGNOSIS — I10 Essential (primary) hypertension: Secondary | ICD-10-CM | POA: Diagnosis not present

## 2023-12-02 LAB — BMP8+EGFR
BUN/Creatinine Ratio: 15 (ref 10–24)
BUN: 18 mg/dL (ref 8–27)
CO2: 22 mmol/L (ref 20–29)
Calcium: 10.4 mg/dL — ABNORMAL HIGH (ref 8.6–10.2)
Chloride: 100 mmol/L (ref 96–106)
Creatinine, Ser: 1.17 mg/dL (ref 0.76–1.27)
Glucose: 133 mg/dL — ABNORMAL HIGH (ref 70–99)
Potassium: 4.3 mmol/L (ref 3.5–5.2)
Sodium: 138 mmol/L (ref 134–144)
eGFR: 64 mL/min/1.73 (ref >59)

## 2023-12-02 LAB — LIPID PANEL
Chol/HDL Ratio: 2.2 ratio (ref 0.0–5.0)
Cholesterol, Total: 156 mg/dL (ref 100–199)
HDL: 70 mg/dL (ref >39)
LDL Chol Calc (NIH): 75 mg/dL (ref 0–99)
Triglycerides: 50 mg/dL (ref 0–149)
VLDL Cholesterol Cal: 11 mg/dL (ref 5–40)

## 2023-12-02 LAB — HEMOGLOBIN A1C
Est. average glucose Bld gHb Est-mCnc: 143 mg/dL
Hgb A1c MFr Bld: 6.6 % — ABNORMAL HIGH (ref 4.8–5.6)

## 2023-12-02 NOTE — Progress Notes (Signed)
 Ricky Shields, CMA,acting as a neurosurgeon for Gaines Ada, FNP.,have documented all relevant documentation on the behalf of Gaines Ada, FNP,as directed by  Gaines Ada, FNP while in the presence of Gaines Ada, FNP.  Subjective:  Patient ID: Ricky Shields , male    DOB: [birth year] , 78 y.o.   MRN: [id number]  Chief Complaint  Patient presents with   Hypertension    Patient presents today for a bp and dm follow up, Patient reports compliance with medication. Patient denies any chest pain, SOB, or headaches. Patient has no concerns today.     Continues to exercise 3 days a week for 2.5 - 3 hours   Diabetes He presents for his follow-up diabetic visit. He has type 2 diabetes mellitus. His disease course has been stable. There are no hypoglycemic associated symptoms. Pertinent negatives for hypoglycemia include no dizziness or headaches. Pertinent negatives for diabetes include no blurred vision, no chest pain, no fatigue, no polydipsia, no polyphagia and no polyuria. There are no hypoglycemic complications. Symptoms are stable. (hyperlipidemia) Risk factors for coronary artery disease include male sex and sedentary lifestyle. Current diabetic treatment includes oral agent (dual therapy). He is compliant with treatment all of the time. His weight is stable. He is following a diabetic diet. When asked about meal planning, he reported none. He has not had a previous visit with a dietitian. He participates in exercise intermittently. His home blood glucose trend is decreasing steadily. (Blood sugar this morning was 128) An ACE inhibitor/angiotensin II receptor blocker is being taken. He does not see a podiatrist.Eye exam is current.  Hypertension This is a chronic problem. The current episode started more than 1 year ago. The problem is unchanged. The problem is controlled. Pertinent negatives include no anxiety, blurred vision, chest pain, headaches or palpitations. There are no associated agents to  hypertension. Risk factors for coronary artery disease include sedentary lifestyle and male gender. Past treatments include angiotensin blockers and diuretics. The current treatment provides no improvement. There are no compliance problems.  There is no history of angina. There is no history of chronic renal disease.    Discussed the use of AI scribe software for clinical note transcription with the patient, who gave verbal consent to proceed.  History of Present Illness Ricky Shields is a 78 year old male with hypertension, hyperlipidemia, and diabetes who presents for a follow-up visit.  He has been monitoring his blood pressure and blood sugar levels at home. This morning, his blood pressure was 122/75 mmHg and his blood sugar was 128 mg/dL. His hemoglobin A1c was last recorded at 6.7%, which has been stable over the past year, ranging between 6.4% and 6.8%.  He exercises regularly, going to the gym three days a week for two and a half to three hours each session. His routine includes one hour on the treadmill, 30 minutes on the elliptical, 30 minutes on the bicycle, and using seven different weight machines.  He mentions that he has been trying to stay warm, although he describes himself as a 'hot natured person'.  Past Medical History:  Diagnosis Date   Back pain    intermittent-prior military injury   BPH (benign prostatic hypertrophy) with urinary retention    Diabetes mellitus without complication (HCC)    GERD (gastroesophageal reflux disease)    High cholesterol    Hypertension    Tingling of skin 03/20/2018     Family History  Problem Relation Age of Onset   Stroke  Mother    Stroke Father      Current Outpatient Medications:    aspirin  EC 81 MG tablet, Take 81 mg by mouth daily. Swallow whole., Disp: , Rfl:    atorvastatin  (LIPITOR) 40 MG tablet, TAKE 1 TABLET(40 MG) BY MOUTH DAILY, Disp: 90 tablet, Rfl: 1   cholecalciferol (VITAMIN D3) 25 MCG (1000 UNIT) tablet, Take  1,000 Units by mouth daily., Disp: , Rfl:    docusate sodium  (COLACE) 100 MG capsule, Take 1 capsule (100 mg total) by mouth 2 (two) times daily., Disp: 30 capsule, Rfl: 0   empagliflozin  (JARDIANCE ) 10 MG TABS tablet, Take 1 tablet (10 mg total) by mouth daily., Disp: 30 tablet, Rfl: 2   esomeprazole (NEXIUM) 20 MG capsule, Take 20 mg by mouth daily as needed (acid reflux)., Disp: , Rfl:    glucose blood (ONETOUCH ULTRA) test strip, Use as instructed to check blood sugars twice daily E11.65, Disp: 100 each, Rfl: 2   Lidocaine  4 % GEL, Apply 1 Application topically daily as needed (pain)., Disp: , Rfl:    linaclotide  (LINZESS ) 72 MCG capsule, Take 1 capsule (72 mcg total) by mouth daily before breakfast., Disp: 90 capsule, Rfl: 1   metFORMIN  (GLUCOPHAGE ) 1000 MG tablet, TAKE 1 TABLET(1000 MG) BY MOUTH TWICE DAILY WITH A MEAL, Disp: 180 tablet, Rfl: 2   Multiple Vitamin (MULTIVITAMIN WITH MINERALS) TABS tablet, Take 1 tablet by mouth daily. One a Day Men's, Disp: , Rfl:    Multiple Vitamins-Minerals (PRESERVISION AREDS 2) CAPS, Take 1 capsule by mouth 2 (two) times daily., Disp: , Rfl:    Olmesartan -amLODIPine -HCTZ 40-10-12.5 MG TABS, TAKE 1 TABLET BY MOUTH DAILY, Disp: 90 tablet, Rfl: 1   Omega-3 Fatty Acids (FISH OIL) 1200 MG CAPS, Take 1,200 mg by mouth daily., Disp: , Rfl:    polyethylene glycol (MIRALAX ) 17 g packet, Take 17 g by mouth daily., Disp: 14 each, Rfl: 0   psyllium (METAMUCIL SMOOTH TEXTURE) 58.6 % powder, Take 1 packet by mouth 3 (three) times daily., Disp: 283 g, Rfl: 12   spironolactone  (ALDACTONE ) 25 MG tablet, Take 0.5 tablets (12.5 mg total) by mouth daily., Disp: 45 tablet, Rfl: 1   sulfamethoxazole -trimethoprim  (BACTRIM  DS) 800-160 MG tablet, Take 1 tablet by mouth 2 (two) times daily., Disp: 10 tablet, Rfl: 0   tamsulosin  (FLOMAX ) 0.4 MG CAPS capsule, Take 1 capsule (0.4 mg total) by mouth daily., Disp: 30 capsule, Rfl: 0   valACYclovir  (VALTREX ) 500 MG tablet, Take 500 mg by  mouth daily., Disp: , Rfl:    Allergies  Allergen Reactions   Bactrim  [Sulfamethoxazole -Trimethoprim ] Hives   Doxycycline  Hives   Penicillins Rash    Has patient had a PCN reaction causing immediate rash, facial/tongue/throat swelling, SOB or lightheadedness with hypotension: No Has patient had a PCN reaction causing severe rash involving mucus membranes or skin necrosis: No Has patient had a PCN reaction that required hospitalization No Has patient had a PCN reaction occurring within the last 10 years: No If all of the above answers are NO, then may proceed with Cephalosporin use.      Review of Systems  Constitutional:  Negative for fatigue.  Eyes:  Negative for blurred vision.  Respiratory: Negative.    Cardiovascular:  Negative for chest pain, palpitations and leg swelling.  Endocrine: Negative for polydipsia, polyphagia and polyuria.  Neurological:  Negative for dizziness and headaches.  Psychiatric/Behavioral: Negative.       Today's Vitals   12/02/23 1042 12/02/23 1105  BP: ROLLEN)  150/60 110/66  Pulse: 77   Temp: 98.1 F (36.7 C)   TempSrc: Oral   Weight: 149 lb 1.6 oz (67.6 kg)   Height: 5' 9 (1.753 m)   PainSc: 0-No pain    Body mass index is 22.02 kg/m.  Wt Readings from Last 3 Encounters:  12/02/23 149 lb 1.6 oz (67.6 kg)  08/25/23 153 lb (69.4 kg)  07/30/23 150 lb 5.4 oz (68.2 kg)     Objective:  Physical Exam Vitals and nursing note reviewed.  Constitutional:      General: He is not in acute distress.    Appearance: Normal appearance.  Cardiovascular:     Rate and Rhythm: Normal rate and regular rhythm.     Pulses: Normal pulses.     Heart sounds: Normal heart sounds. No murmur heard. Pulmonary:     Effort: Pulmonary effort is normal. No respiratory distress.     Breath sounds: No wheezing.  Musculoskeletal:        General: No swelling. Normal range of motion.     Right lower leg: No edema.     Left lower leg: No edema.  Skin:    General:  Skin is warm and dry.     Capillary Refill: Capillary refill takes less than 2 seconds.     Coloration: Skin is not jaundiced.  Neurological:     General: No focal deficit present.     Mental Status: He is alert and oriented to person, place, and time.     Cranial Nerves: No cranial nerve deficit.     Motor: No weakness.  Psychiatric:        Mood and Affect: Mood normal.        Behavior: Behavior normal.        Thought Content: Thought content normal.        Judgment: Judgment normal.     Assessment And Plan:   Assessment & Plan Essential hypertension Well-controlled with blood pressure of 110/66 mmHg. - Continue current antihypertensive regimen. Type 2 diabetes mellitus with hyperlipidemia (HCC) Hemoglobin A1c of 6.7, with values ranging between 6.6 and 6.8 over the last year, and blood sugar levels slightly elevated at 128. - Ordered hemoglobin A1c test. - Continue current diabetes management plan.  Orders Placed This Encounter  Procedures   Hemoglobin A1c   Lipid panel   BMP8+eGFR     Return for controlled DM check 4 months.  Patient was given opportunity to ask questions. Patient verbalized understanding of the plan and was able to repeat key elements of the plan. All questions were answered to their satisfaction.   Ricky Gaines Ada, FNP, have reviewed all documentation for this visit. The documentation on 12/02/23 for the exam, diagnosis, procedures, and orders are all accurate and complete.    IF YOU HAVE BEEN REFERRED TO A SPECIALIST, IT MAY TAKE 1-2 WEEKS TO SCHEDULE/PROCESS THE REFERRAL. IF YOU HAVE NOT HEARD FROM US /SPECIALIST IN TWO WEEKS, PLEASE GIVE US  A CALL AT [phone].

## 2023-12-10 ENCOUNTER — Ambulatory Visit: Payer: Self-pay | Admitting: Nurse Practitioner

## 2023-12-10 NOTE — Assessment & Plan Note (Signed)
 Well-controlled with blood pressure of 110/66 mmHg. - Continue current antihypertensive regimen.

## 2023-12-10 NOTE — Assessment & Plan Note (Signed)
 Hemoglobin A1c of 6.7, with values ranging between 6.6 and 6.8 over the last year, and blood sugar levels slightly elevated at 128. - Ordered hemoglobin A1c test. - Continue current diabetes management plan.

## 2023-12-20 ENCOUNTER — Other Ambulatory Visit: Payer: Self-pay | Admitting: Nurse Practitioner

## 2024-01-12 ENCOUNTER — Other Ambulatory Visit: Payer: Self-pay | Admitting: Nurse Practitioner

## 2024-01-12 DIAGNOSIS — E119 Type 2 diabetes mellitus without complications: Secondary | ICD-10-CM

## 2024-01-21 ENCOUNTER — Ambulatory Visit: Payer: Self-pay | Admitting: Nurse Practitioner

## 2024-01-21 ENCOUNTER — Encounter: Payer: Self-pay | Admitting: Nurse Practitioner

## 2024-01-21 ENCOUNTER — Ambulatory Visit
Admission: RE | Admit: 2024-01-21 | Discharge: 2024-01-21 | Disposition: A | Discharge status: Home / Self Care | Source: Ambulatory Visit | Attending: Nurse Practitioner | Admitting: Nurse Practitioner

## 2024-01-21 VITALS — BP 120/60 | HR 93 | Temp 98.4°F | Ht 69.0 in | Wt 154.8 lb

## 2024-01-21 DIAGNOSIS — M25561 Pain in right knee: Secondary | ICD-10-CM

## 2024-01-21 MED ORDER — KETOROLAC TROMETHAMINE 60 MG/2ML IM SOLN
60.0000 mg | Freq: Once | INTRAMUSCULAR | Status: AC
  Administered 2024-01-21: 60 mg via INTRAMUSCULAR

## 2024-01-21 NOTE — Progress Notes (Signed)
 LILLETTE Kristeen JINNY Gladis, CMA,acting as a neurosurgeon for Gaines Ada, FNP.,have documented all relevant documentation on the behalf of Gaines Ada, FNP,as directed by  Gaines Ada, FNP while in the presence of Gaines Ada, FNP.  Subjective:  Patient ID: Ricky Shields , male    DOB: Nov 27, 1945 , 79 y.o.   MRN: [id number]  Chief Complaint  Patient presents with   Knee Pain    Patient reports he has been having right knee pain since Christmas after hitting it on the foot of his bed.      HPI  Discussed the use of AI scribe software for clinical note transcription with the patient, who gave verbal consent to proceed.  History of Present Illness Ricky Shields is a 79 year old male who presents with knee pain and swelling after an injury.  He injured his knee while making a bed at his son's house during Christmas, hitting it on the foot of the bed rail. Initially, he experienced mild pain, but the following day, he noticed significant swelling, describing it as 'a big apple sitting on my knee'.  The knee has soft spots and is sore, with pain exacerbated by walking. He has been using a heat pad, ice pack, and alcohol for relief, and takes Tylenol  occasionally. The swelling sometimes decreases after using the ice pack or heat pad but returns shortly after.  He recalls having seen an orthopedic doctor in the past, possibly in 2021, but cannot remember the name. He rates his pain as a nine out of ten on a pain scale.  Past Medical History:  Diagnosis Date   Back pain    intermittent-prior military injury   BPH (benign prostatic hypertrophy) with urinary retention    Diabetes mellitus without complication (HCC)    GERD (gastroesophageal reflux disease)    High cholesterol    Hypertension    Tingling of skin 03/20/2018     Family History  Problem Relation Age of Onset   Stroke Mother    Stroke Father     Current Medications[1]   Allergies[2]   Review of Systems  Constitutional:  Negative  for fatigue.  Respiratory: Negative.    Cardiovascular:  Negative for chest pain, palpitations and leg swelling.  Endocrine: Negative for polydipsia, polyphagia and polyuria.  Musculoskeletal:        Right knee pain and injury  Neurological:  Negative for dizziness and headaches.  Psychiatric/Behavioral: Negative.       Today's Vitals   01/21/24 1423  BP: 120/60  Pulse: 93  Temp: 98.4 F (36.9 C)  TempSrc: Oral  Weight: 154 lb 12.8 oz (70.2 kg)  Height: 5' 9 (1.753 m)  PainSc: 10-Worst pain ever  PainLoc: Knee   Body mass index is 22.86 kg/m.  Wt Readings from Last 3 Encounters:  01/21/24 154 lb 12.8 oz (70.2 kg)  12/02/23 149 lb 1.6 oz (67.6 kg)  08/25/23 153 lb (69.4 kg)      Objective:  Physical Exam Vitals and nursing note reviewed.  Constitutional:      General: He is not in acute distress.    Appearance: Normal appearance.  Cardiovascular:     Rate and Rhythm: Normal rate and regular rhythm.     Pulses: Normal pulses.     Heart sounds: Normal heart sounds. No murmur heard. Pulmonary:     Effort: Pulmonary effort is normal. No respiratory distress.     Breath sounds: No wheezing.  Musculoskeletal:  General: Swelling, tenderness (right knee pain) and signs of injury (during Christmas hit knee on end of the bed) present. No deformity. Normal range of motion.     Right lower leg: No edema.     Left lower leg: No edema.  Skin:    General: Skin is warm and dry.     Capillary Refill: Capillary refill takes less than 2 seconds.     Coloration: Skin is not jaundiced.  Neurological:     General: No focal deficit present.     Mental Status: He is alert and oriented to person, place, and time.     Cranial Nerves: No cranial nerve deficit.     Motor: No weakness.  Psychiatric:        Mood and Affect: Mood normal.        Behavior: Behavior normal.        Thought Content: Thought content normal.        Judgment: Judgment normal.        Assessment And  Plan:   Assessment & Plan Acute pain of right knee Pain and swelling post-trauma suggest inflammation or infection. Differential includes inflammation, infection, or fluid accumulation. - Ordered knee x-ray to assess for fractures or abnormalities. - Administered Toradol  60 mg IM for pain. - Referred to orthopedics for evaluation and possible fluid drainage. - Advised x-ray at [facility], walk-in.  Orders Placed This Encounter  Procedures   DG Knee Complete 4 Views Right     Return for keep same next.  Patient was given opportunity to ask questions. Patient verbalized understanding of the plan and was able to repeat key elements of the plan. All questions were answered to their satisfaction.    LILLETTE Gaines Ada, FNP, have reviewed all documentation for this visit. The documentation on 01/21/2024 for the exam, diagnosis, procedures, and orders are all accurate and complete.   IF YOU HAVE BEEN REFERRED TO A SPECIALIST, IT MAY TAKE 1-2 WEEKS TO SCHEDULE/PROCESS THE REFERRAL. IF YOU HAVE NOT HEARD FROM US /SPECIALIST IN TWO WEEKS, PLEASE GIVE US  A CALL AT [phone].      [1]  Current Outpatient Medications:    aspirin  EC 81 MG tablet, Take 81 mg by mouth daily. Swallow whole., Disp: , Rfl:    atorvastatin  (LIPITOR) 40 MG tablet, TAKE 1 TABLET(40 MG) BY MOUTH DAILY, Disp: 90 tablet, Rfl: 1   cholecalciferol (VITAMIN D3) 25 MCG (1000 UNIT) tablet, Take 1,000 Units by mouth daily., Disp: , Rfl:    docusate sodium  (COLACE) 100 MG capsule, Take 1 capsule (100 mg total) by mouth 2 (two) times daily., Disp: 30 capsule, Rfl: 0   esomeprazole (NEXIUM) 20 MG capsule, Take 20 mg by mouth daily as needed (acid reflux)., Disp: , Rfl:    glucose blood (ONETOUCH ULTRA) test strip, Use as instructed to check blood sugars twice daily E11.65, Disp: 100 each, Rfl: 2   JARDIANCE  10 MG TABS tablet, TAKE 1 TABLET(10 MG) BY MOUTH DAILY, Disp: 30 tablet, Rfl: 2   Lidocaine  4 % GEL, Apply 1  Application topically daily as needed (pain)., Disp: , Rfl:    linaclotide  (LINZESS ) 72 MCG capsule, Take 1 capsule (72 mcg total) by mouth daily before breakfast., Disp: 90 capsule, Rfl: 1   metFORMIN  (GLUCOPHAGE ) 1000 MG tablet, TAKE 1 TABLET(1000 MG) BY MOUTH TWICE DAILY WITH A MEAL, Disp: 180 tablet, Rfl: 2   Multiple Vitamin (MULTIVITAMIN WITH MINERALS) TABS tablet, Take 1 tablet by mouth daily. One a  Day Men's, Disp: , Rfl:    Multiple Vitamins-Minerals (PRESERVISION AREDS 2) CAPS, Take 1 capsule by mouth 2 (two) times daily., Disp: , Rfl:    Olmesartan -amLODIPine -HCTZ 40-10-12.5 MG TABS, TAKE 1 TABLET BY MOUTH DAILY, Disp: 90 tablet, Rfl: 1   Omega-3 Fatty Acids (FISH OIL) 1200 MG CAPS, Take 1,200 mg by mouth daily., Disp: , Rfl:    polyethylene glycol (MIRALAX ) 17 g packet, Take 17 g by mouth daily., Disp: 14 each, Rfl: 0   psyllium (METAMUCIL SMOOTH TEXTURE) 58.6 % powder, Take 1 packet by mouth 3 (three) times daily., Disp: 283 g, Rfl: 12   spironolactone  (ALDACTONE ) 25 MG tablet, Take 0.5 tablets (12.5 mg total) by mouth daily., Disp: 45 tablet, Rfl: 1   sulfamethoxazole -trimethoprim  (BACTRIM  DS) 800-160 MG tablet, Take 1 tablet by mouth 2 (two) times daily., Disp: 10 tablet, Rfl: 0   tamsulosin  (FLOMAX ) 0.4 MG CAPS capsule, Take 1 capsule (0.4 mg total) by mouth daily., Disp: 30 capsule, Rfl: 0   valACYclovir  (VALTREX ) 500 MG tablet, Take 500 mg by mouth daily., Disp: , Rfl:   Current Facility-Administered Medications:    ketorolac  (TORADOL ) injection 60 mg, 60 mg, Intramuscular, Once,  [2]  Allergies Allergen Reactions   Bactrim  [Sulfamethoxazole -Trimethoprim ] Hives   Doxycycline  Hives   Penicillins Rash    Has patient had a PCN reaction causing immediate rash, facial/tongue/throat swelling, SOB or lightheadedness with hypotension: No Has patient had a PCN reaction causing severe rash involving mucus membranes or skin necrosis: No Has patient had a PCN reaction that required  hospitalization No Has patient had a PCN reaction occurring within the last 10 years: No If all of the above answers are NO, then may proceed with Cephalosporin use.

## 2024-02-01 ENCOUNTER — Ambulatory Visit: Payer: Self-pay | Admitting: Nurse Practitioner

## 2024-02-03 ENCOUNTER — Other Ambulatory Visit: Payer: Self-pay | Admitting: Nurse Practitioner

## 2024-02-03 DIAGNOSIS — M25561 Pain in right knee: Secondary | ICD-10-CM

## 2024-02-18 ENCOUNTER — Ambulatory Visit: Admitting: Physician Assistant

## 2024-02-19 ENCOUNTER — Ambulatory Visit: Admitting: Physician Assistant

## 2024-02-19 DIAGNOSIS — S8001XA Contusion of right knee, initial encounter: Secondary | ICD-10-CM

## 2024-02-19 DIAGNOSIS — M25561 Pain in right knee: Secondary | ICD-10-CM

## 2024-02-19 MED ORDER — BUPIVACAINE HCL 0.25 % IJ SOLN
5.0000 mL | INTRAMUSCULAR | Status: AC | PRN
  Administered 2024-02-19: 5 mL via INTRA_ARTICULAR

## 2024-02-19 MED ORDER — LIDOCAINE HCL 1 % IJ SOLN
5.0000 mL | INTRAMUSCULAR | Status: AC | PRN
  Administered 2024-02-19: 5 mL

## 2024-02-19 NOTE — Progress Notes (Addendum)
 "  Office Visit Note   Patient: Ricky Shields           Date of Birth: [birth year]           MRN: [id number] Visit Date: 02/19/2024              Requested by: Georgina Speaks, FNP [address] [place],  [place] [address] PCP: Georgina Speaks, FNP   Assessment & Plan: Visit Diagnoses:  1. Acute pain of right knee   2. Traumatic hematoma of right knee, initial encounter     Plan: Impression is right knee hematoma.  There are no signs to indicate infection.  On exam, this area does feel fluctuant so I have offered aspirating this.  I was able to aspirate approximately 5 cc of blood.  We then applied a compression wrap.  I have also recommended heat and continued compression.  Follow-up as needed.  Follow-Up Instructions: Return if symptoms worsen or fail to improve.   Orders:  Orders Placed This Encounter  Procedures   Large Joint Inj   No orders of the defined types were placed in this encounter.     Procedures: Large Joint Inj: R knee on 02/19/2024 10:27 AM Indications: pain Details: 22 G needle, anterolateral approach Medications: 5 mL lidocaine  1 %; 5 mL bupivacaine  0.25 %      Clinical Data: No additional findings.   Subjective: Chief Complaint  Patient presents with   Right Knee - Pain    HPI patient is a very pleasant 79 year old gentleman who comes in today with concerns about his right knee.  Around Christmas time this past year, he was making a bed and hit the anterior aspect of his right knee on the corner of a hard bed frame.  He noticed increased pain and swelling the following day.  X-rays were obtained on 01/24/2024 which were negative for fracture.  He tells me his pain has significantly improved to the point where he has no pain unless he applies pressure to the area.  He does note a little stiffness with knee flexion.  No fevers or chills or other constitutional symptoms.  Review of Systems as detailed in HPI.  All others are  negative.   Objective: Vital Signs: There were no vitals taken for this visit.  Physical Exam well-developed well-nourished gentleman in no acute distress.  Alert and oriented x 3.  Ortho Exam right knee exam: He does have a thickened prepatellar bursa.  He has an area to the suprapatellar region which is somewhat fluctuant.  This is mildly tender.  No redness.  Painless range of motion of the knee.  He has neurovascular intact distally.  Specialty Comments:  No specialty comments available.  Imaging: No new imaging   PMFS History: Patient Active Problem List   Diagnosis Date Noted   Encounter for Medicare annual wellness exam 08/25/2023   History of UTI 08/25/2023   Urinary tract infection without hematuria 08/07/2023   Influenza vaccination declined 03/03/2023   COVID-19 vaccination declined 03/03/2023   Palpitation 10/29/2022   Acute cystitis without hematuria 08/12/2022   Body mass index (BMI) of 21.0 to 21.9 in adult 07/29/2022   Abnormal weight loss 07/29/2022   Encounter for annual health examination 07/29/2022   Constipation 03/20/2022   Hemorrhage of rectum and anus 03/20/2022   Rectal bleeding 03/20/2022   Herniation of lumbar intervertebral disc with radiculopathy 03/20/2022   Screening for malignant neoplasm of colon 03/20/2022   Type 2 diabetes  mellitus with hyperlipidemia (HCC) 03/20/2022   Mixed hyperlipidemia 03/20/2022   Spinal stenosis of lumbar region with neurogenic claudication 08/16/2021   Spinal stenosis of lumbar region 02/17/2020   Essential hypertension 12/19/2017   Chronic low back pain 07/26/2014   Past Medical History:  Diagnosis Date   Back pain    intermittent-prior military injury   BPH (benign prostatic hypertrophy) with urinary retention    Diabetes mellitus without complication (HCC)    GERD (gastroesophageal reflux disease)    High cholesterol    Hypertension    Tingling of skin 03/20/2018    Family History  Problem Relation  Age of Onset   Stroke Mother    Stroke Father     Past Surgical History:  Procedure Laterality Date   APPENDECTOMY     COLONOSCOPY     GREEN LIGHT LASER TURP (TRANSURETHRAL RESECTION OF PROSTATE N/A 10/21/2014   Procedure: GREEN LIGHT LASER TURP (TRANSURETHRAL RESECTION OF PROSTATE;  Surgeon: Donnice Brooks, MD;  Location: [facility];  Service: Urology;  Laterality: N/A;   LUMBAR LAMINECTOMY/DECOMPRESSION MICRODISCECTOMY Bilateral 08/16/2021   Procedure: BILATERAL LUMBAR THREE-FOUR, LUMBAR FOUR-FIVE LAMINECTOMY LEAR; POSS DISKECTOMY;  Surgeon: Mavis Purchase, MD;  Location: [facility] OR;  Service: Neurosurgery;  Laterality: Bilateral;   Social History   Occupational History   Occupation: retired  Tobacco Use   Smoking status: Never   Smokeless tobacco: Never  Vaping Use   Vaping status: Never Used  Substance and Sexual Activity   Alcohol use: No    Alcohol/week: 0.0 standard drinks of alcohol   Drug use: No   Sexual activity: Yes        "

## 2024-02-19 NOTE — Addendum Note (Signed)
 Addended by: JULE MORNA CROME on: 02/19/2024 10:28 AM   Modules accepted: Level of Service

## 2024-03-31 ENCOUNTER — Ambulatory Visit: Admitting: Nurse Practitioner

## 2024-08-02 ENCOUNTER — Encounter: Payer: Self-pay | Admitting: Nurse Practitioner
